# Patient Record
Sex: Male | Born: 1965
Health system: Southern US, Community
[De-identification: ages and names within clinical notes are randomized; demographics above are authoritative.]

## PROBLEM LIST (undated history)

## (undated) DIAGNOSIS — Z9889 Other specified postprocedural states: Secondary | ICD-10-CM

## (undated) DIAGNOSIS — C8190 Hodgkin lymphoma, unspecified, unspecified site: Secondary | ICD-10-CM

## (undated) DIAGNOSIS — IMO0002 Reserved for concepts with insufficient information to code with codable children: Secondary | ICD-10-CM

## (undated) DIAGNOSIS — R7989 Other specified abnormal findings of blood chemistry: Secondary | ICD-10-CM

## (undated) DIAGNOSIS — T4145XA Adverse effect of unspecified anesthetic, initial encounter: Secondary | ICD-10-CM

## (undated) DIAGNOSIS — N529 Male erectile dysfunction, unspecified: Secondary | ICD-10-CM

## (undated) DIAGNOSIS — I10 Essential (primary) hypertension: Secondary | ICD-10-CM

## (undated) DIAGNOSIS — R112 Nausea with vomiting, unspecified: Secondary | ICD-10-CM

## (undated) DIAGNOSIS — T8859XA Other complications of anesthesia, initial encounter: Secondary | ICD-10-CM

## (undated) DIAGNOSIS — E785 Hyperlipidemia, unspecified: Secondary | ICD-10-CM

## (undated) HISTORY — DX: Essential (primary) hypertension: I10

## (undated) HISTORY — DX: Hodgkin lymphoma, unspecified, unspecified site: C81.90

## (undated) HISTORY — PX: PORTA CATH REMOVAL: CATH118286

## (undated) HISTORY — PX: PORTA CATH INSERTION: CATH118285

## (undated) HISTORY — DX: Male erectile dysfunction, unspecified: N52.9

## (undated) HISTORY — PX: SPLENECTOMY: SUR1306

## (undated) HISTORY — DX: Other specified abnormal findings of blood chemistry: R79.89

## (undated) HISTORY — DX: Reserved for concepts with insufficient information to code with codable children: IMO0002

## (undated) HISTORY — DX: Hyperlipidemia, unspecified: E78.5

---

## 1987-03-02 HISTORY — PX: IR LYMPHANGIOGRAM EXTREMITY UNI LEFT: IMG5778

## 2002-10-13 ENCOUNTER — Encounter: Payer: Self-pay | Admitting: Hematology

## 2012-05-18 ENCOUNTER — Other Ambulatory Visit: Payer: Self-pay | Admitting: *Deleted

## 2012-05-18 MED ORDER — AMLODIPINE BESYLATE 5 MG PO TABS: 5.0000 mg | ORAL_TABLET | Freq: Every day | ORAL | Status: DC

## 2012-07-02 ENCOUNTER — Ambulatory Visit: Payer: Self-pay | Admitting: Family Medicine

## 2012-09-17 ENCOUNTER — Other Ambulatory Visit: Payer: Self-pay | Admitting: Family Medicine

## 2012-09-20 NOTE — Telephone Encounter (Signed)
Due for an office visit. Needs to be seen. Can refill for 2 weeks only.

## 2012-09-20 NOTE — Telephone Encounter (Signed)
[place] to address 

## 2012-09-20 NOTE — Telephone Encounter (Signed)
Not seen since EPIC started  Last filled 05/18/12 [facility] 3 RF's

## 2012-09-21 NOTE — Telephone Encounter (Signed)
Called in to [facility] 

## 2012-10-05 ENCOUNTER — Other Ambulatory Visit: Payer: Self-pay | Admitting: Family Medicine

## 2012-10-15 ENCOUNTER — Encounter: Payer: Self-pay | Admitting: Family Medicine

## 2012-10-15 ENCOUNTER — Ambulatory Visit (INDEPENDENT_AMBULATORY_CARE_PROVIDER_SITE_OTHER): Payer: BC Managed Care – PPO | Admitting: Family Medicine

## 2012-10-15 VITALS — BP 143/95 | HR 70 | Temp 97.9°F | Ht 73.5 in | Wt 230.4 lb

## 2012-10-15 DIAGNOSIS — C8190 Hodgkin lymphoma, unspecified, unspecified site: Secondary | ICD-10-CM

## 2012-10-15 DIAGNOSIS — R7989 Other specified abnormal findings of blood chemistry: Secondary | ICD-10-CM | POA: Insufficient documentation

## 2012-10-15 DIAGNOSIS — E785 Hyperlipidemia, unspecified: Secondary | ICD-10-CM

## 2012-10-15 DIAGNOSIS — N529 Male erectile dysfunction, unspecified: Secondary | ICD-10-CM

## 2012-10-15 DIAGNOSIS — IMO0002 Reserved for concepts with insufficient information to code with codable children: Secondary | ICD-10-CM

## 2012-10-15 DIAGNOSIS — E291 Testicular hypofunction: Secondary | ICD-10-CM

## 2012-10-15 DIAGNOSIS — I10 Essential (primary) hypertension: Secondary | ICD-10-CM

## 2012-10-15 DIAGNOSIS — C8108 Nodular lymphocyte predominant Hodgkin lymphoma, lymph nodes of multiple sites: Secondary | ICD-10-CM | POA: Insufficient documentation

## 2012-10-15 MED ORDER — AMLODIPINE BESYLATE 5 MG PO TABS: ORAL_TABLET | ORAL | Status: DC

## 2012-10-15 NOTE — Patient Instructions (Addendum)
    Dr Doyel Mulkern's Recommendations  For nutrition information, I recommend books:  1).Eat to Live by Dr Joel Fuhrman. 2).Prevent and Reverse Heart Disease by Dr Caldwell Esselstyn. 3) Dr Neal Barnard's Book:  Program to Reverse Diabetes  Exercise recommendations are:  If unable to walk, then the patient can exercise in a chair 3 times a day. By flapping arms like a bird gently and raising legs outwards to the front.  If ambulatory, the patient can go for walks for 30 minutes 3 times a week. Then increase the intensity and duration as tolerated.  Goal is to try to attain exercise frequency to 5 times a week.  If applicable: Best to perform resistance exercises (machines or weights) 2 days a week and cardio type exercises 3 days per week.  DASH Diet The DASH diet stands for "Dietary Approaches to Stop Hypertension." It is a healthy eating plan that has been shown to reduce high blood pressure (hypertension) in as little as 14 days, while also possibly providing other significant health benefits. These other health benefits include reducing the risk of breast cancer after menopause and reducing the risk of type 2 diabetes, heart disease, colon cancer, and stroke. Health benefits also include weight loss and slowing kidney failure in patients with chronic kidney disease.  DIET GUIDELINES  Limit salt (sodium). Your diet should contain less than 1500 mg of sodium daily.  Limit refined or processed carbohydrates. Your diet should include mostly whole grains. Desserts and added sugars should be used sparingly.  Include small amounts of heart-healthy fats. These types of fats include nuts, oils, and tub margarine. Limit saturated and trans fats. These fats have been shown to be harmful in the body. CHOOSING FOODS  The following food groups are based on a 2000 calorie diet. See your Registered Dietitian for individual calorie needs. Grains and Grain Products (6 to 8 servings daily)  Eat More  Often: Whole-wheat bread, Zullo rice, whole-grain or wheat pasta, quinoa, popcorn without added fat or salt (air popped).  Eat Less Often: White bread, white pasta, white rice, cornbread. Vegetables (4 to 5 servings daily)  Eat More Often: Fresh, frozen, and canned vegetables. Vegetables may be raw, steamed, roasted, or grilled with a minimal amount of fat.  Eat Less Often/Avoid: Creamed or fried vegetables. Vegetables in a cheese sauce. Fruit (4 to 5 servings daily)  Eat More Often: All fresh, canned (in natural juice), or frozen fruits. Dried fruits without added sugar. One hundred percent fruit juice ( cup [237 mL] daily).  Eat Less Often: Dried fruits with added sugar. Canned fruit in light or heavy syrup. Lean Meats, Fish, and Poultry (2 servings or less daily. One serving is 3 to 4 oz [85-114 g]).  Eat More Often: Ninety percent or leaner ground beef, tenderloin, sirloin. Round cuts of beef, chicken breast, turkey breast. All fish. Grill, bake, or broil your meat. Nothing should be fried.  Eat Less Often/Avoid: Fatty cuts of meat, turkey, or chicken leg, thigh, or wing. Fried cuts of meat or fish. Dairy (2 to 3 servings)  Eat More Often: Low-fat or fat-free milk, low-fat plain or light yogurt, reduced-fat or part-skim cheese.  Eat Less Often/Avoid: Milk (whole, 2%).Whole milk yogurt. Full-fat cheeses. Nuts, Seeds, and Legumes (4 to 5 servings per week)  Eat More Often: All without added salt.  Eat Less Often/Avoid: Salted nuts and seeds, canned beans with added salt. Fats and Sweets (limited)  Eat More Often: Vegetable oils, tub margarines   without trans fats, sugar-free gelatin. Mayonnaise and salad dressings.  Eat Less Often/Avoid: Coconut oils, palm oils, butter, stick margarine, cream, half and half, cookies, candy, pie. FOR MORE INFORMATION The Dash Diet Eating Plan: www.dashdiet.org Document Released: 01/09/2011 Document Revised: 04/14/2011 Document Reviewed:  01/09/2011 Exit[employer] Patient Information 2014 [employer].  

## 2012-10-15 NOTE — Progress Notes (Signed)
Patient ID: Devin Tran, male   DOB: March 20, 1965, 47 y.o.   MRN: [id number] SUBJECTIVE: CC: Chief Complaint  Patient presents with  . Medication Refill    HPI: Patient is here for follow up of hypertension/HLD: denies Headache;deniesChest Pain;denies weakness;denies Shortness of Breath or Orthopnea;denies Visual changes;denies palpitations;denies cough;denies pedal edema;denies symptoms of TIA or stroke; admits to Compliance with medications. denies Problems with medications.  Past Medical History  Diagnosis Date  . Hypertension   . Erectile dysfunction   . Hyperlipidemia   . Infertility   . Hodgkin's lymphoma   . Low testosterone    No past surgical history on file. History   Social History  . Marital Status: Single    Spouse Name: N/A    Number of Children: N/A  . Years of Education: N/A   Occupational History  . Not on file.   Social History Main Topics  . Smoking status: Never Smoker   . Smokeless tobacco: Not on file  . Alcohol Use: No  . Drug Use: No  . Sexual Activity: Not on file   Other Topics Concern  . Not on file   Social History Narrative  . No narrative on file   Family History  Problem Relation Age of Onset  . Hypertension Mother   . Cancer Father     lung  . Hypertension Father   . Diabetes Father   . COPD Father   . Hypertension Brother   . Stroke Brother    No current outpatient prescriptions on file prior to visit.   No current facility-administered medications on file prior to visit.   Allergies  Allergen Reactions  . Bleomycin     Cramping all over    There is no immunization history on file for this patient. Prior to Admission medications   Medication Sig Start Date End Date Taking? Authorizing Provider  amLODipine (NORVASC) 5 MG tablet TAKE 1 TABLET (5 MG TOTAL) BY MOUTH DAILY. 10/15/12  Yes Ileana Ladd, MD     ROS: As above in the HPI. All other systems are stable or negative.  OBJECTIVE: APPEARANCE:  Patient in  no acute distress.The patient appeared well nourished and normally developed. Acyanotic. Waist: VITAL SIGNS:BP 143/95  Pulse 70  Temp(Src) 97.9 F (36.6 C) (Oral)  Ht 6' 1.5" (1.867 m)  Wt 230 lb 6.4 oz (104.509 kg)  BMI 29.98 kg/m2 Overweight AAM  SKIN: warm and  Dry without overt rashes, tattoos and scars  HEAD and Neck: without JVD, Head and scalp: normal Eyes:No scleral icterus. Fundi normal, eye movements normal. Ears: Auricle normal, canal normal, Tympanic membranes normal, insufflation normal. Nose: normal Throat: normal Neck & thyroid: normal  CHEST & LUNGS: Chest wall: normal Lungs: Clear  CVS: Reveals the PMI to be normally located. Regular rhythm, First and Second Heart sounds are normal,  absence of murmurs, rubs or gallops. Peripheral vasculature: Radial pulses: normal Dorsal pedis pulses: normal Posterior pulses: normal  ABDOMEN:  Appearance: normal Benign, no organomegaly, no masses, no Abdominal Aortic enlargement. No Guarding , no rebound. No Bruits. Bowel sounds: normal  RECTAL: N/A GU: N/A  EXTREMETIES: nonedematous.  MUSCULOSKELETAL:  Spine: normal Joints: intact  NEUROLOGIC: oriented to time,place and person; nonfocal. Strength is normal Sensory is normal Reflexes are normal Cranial Nerves are normal.    ASSESSMENT: Hypertension - Plan: CMP14+EGFR  Hyperlipidemia - Plan: CMP14+EGFR, NMR, lipoprofile  Erectile dysfunction - Plan: Testosterone,Free and Total  Hodgkin's lymphoma  Infertility - Plan: Testosterone,Free and Total  Low testosterone - Plan: Testosterone,Free and Total   PLAN: DASH Diet in the AVS       Dr Woodroe Mode Recommendations  For nutrition information, I recommend books:  1).Eat to Live by Dr Monico Hoar. 2).Prevent and Reverse Heart Disease by Dr Suzzette Righter. 3) Dr Katherina Right Book:  Program to Reverse Diabetes  Exercise recommendations are:  If unable to walk, then the patient can  exercise in a chair 3 times a day. By flapping arms like a bird gently and raising legs outwards to the front.  If ambulatory, the patient can go for walks for 30 minutes 3 times a week. Then increase the intensity and duration as tolerated.  Goal is to try to attain exercise frequency to 5 times a week.  If applicable: Best to perform resistance exercises (machines or weights) 2 days a week and cardio type exercises 3 days per week.  Orders Placed This Encounter  Procedures  . CMP14+EGFR  . NMR, lipoprofile  . Testosterone,Free and Total   Meds ordered this encounter  Medications  . amLODipine (NORVASC) 5 MG tablet    Sig: TAKE 1 TABLET (5 MG TOTAL) BY MOUTH DAILY.    Dispense:  30 tablet    Refill:  4    Discussed the goals for his weight and BP and LIpids.  Return in about 4 months (around 02/14/2013) for PEX and , recheck BP.  Gamal Todisco P. [place] Charon, M.D.

## 2012-10-18 LAB — CMP14+EGFR
ALT: 35 IU/L (ref 0–44)
AST: 22 IU/L (ref 0–40)
Albumin/Globulin Ratio: 2.2 (ref 1.1–2.5)
Albumin: 4.8 g/dL (ref 3.5–5.5)
Alkaline Phosphatase: 61 IU/L (ref 39–117)
BUN/Creatinine Ratio: 14 (ref 9–20)
BUN: 15 mg/dL (ref 6–24)
CO2: 25 mmol/L (ref 18–29)
Calcium: 10.2 mg/dL (ref 8.7–10.2)
Chloride: 99 mmol/L (ref 97–108)
Creatinine, Ser: 1.11 mg/dL (ref 0.76–1.27)
GFR calc Af Amer: 92 mL/min/{1.73_m2} (ref >59)
GFR calc non Af Amer: 80 mL/min/{1.73_m2} (ref >59)
Globulin, Total: 2.2 g/dL (ref 1.5–4.5)
Glucose: 90 mg/dL (ref 65–99)
Potassium: 4.4 mmol/L (ref 3.5–5.2)
Sodium: 142 mmol/L (ref 134–144)
Total Bilirubin: 0.4 mg/dL (ref 0.0–1.2)
Total Protein: 7 g/dL (ref 6.0–8.5)

## 2012-10-18 LAB — TESTOSTERONE,FREE AND TOTAL
Testosterone, Free: 6.5 pg/mL — ABNORMAL LOW (ref 6.8–21.5)
Testosterone: 395 ng/dL (ref 348–1197)

## 2012-10-18 LAB — NMR, LIPOPROFILE
Cholesterol: 181 mg/dL (ref <200)
HDL Cholesterol by NMR: 46 mg/dL (ref >=40)
HDL Particle Number: 38.3 umol/L (ref >=30.5)
LDL Particle Number: 1821 nmol/L — ABNORMAL HIGH (ref <1000)
LDL Size: 20.6 nm (ref >20.5)
LDLC SERPL CALC-MCNC: 118 mg/dL — ABNORMAL HIGH (ref <100)
LP-IR Score: 62 — ABNORMAL HIGH (ref <=45)
Small LDL Particle Number: 948 nmol/L — ABNORMAL HIGH (ref <=527)
Triglycerides by NMR: 86 mg/dL (ref <150)

## 2012-10-18 NOTE — Progress Notes (Signed)
Quick Note:  Labs abnormal. The lipid LDLparticle is very high. The testosterone is borderline low. Weight loss, plant based diet, and daily exercise could improve this, so that we can avoid medications. However, he should start on a baby aspirin daily. RTc in 3 months to recheck. If it doesn't improve we will need to start on a statin and if the testosterone drops lower may need to be on testosterone replacement. Prefer not to start on medications as yet because of risks. ______

## 2012-10-21 ENCOUNTER — Telehealth: Payer: Self-pay | Admitting: Family Medicine

## 2012-10-21 NOTE — Telephone Encounter (Signed)
Pt notified of labs

## 2012-11-08 ENCOUNTER — Other Ambulatory Visit: Payer: Self-pay | Admitting: Family Medicine

## 2012-11-09 NOTE — Telephone Encounter (Signed)
Prescription renewed in EPIC. 

## 2012-11-09 NOTE — Telephone Encounter (Signed)
Last seen 10/15/12  [place] Charon

## 2013-02-15 ENCOUNTER — Ambulatory Visit: Payer: BC Managed Care – PPO | Admitting: Family Medicine

## 2013-05-12 ENCOUNTER — Other Ambulatory Visit: Payer: Self-pay | Admitting: Family Medicine

## 2013-05-20 ENCOUNTER — Encounter: Payer: Self-pay | Admitting: Family Medicine

## 2013-05-20 ENCOUNTER — Ambulatory Visit (INDEPENDENT_AMBULATORY_CARE_PROVIDER_SITE_OTHER): Payer: BC Managed Care – PPO | Admitting: Family Medicine

## 2013-05-20 VITALS — BP 129/86 | HR 70 | Temp 98.9°F | Ht 73.5 in | Wt 234.6 lb

## 2013-05-20 DIAGNOSIS — C8190 Hodgkin lymphoma, unspecified, unspecified site: Secondary | ICD-10-CM

## 2013-05-20 DIAGNOSIS — E785 Hyperlipidemia, unspecified: Secondary | ICD-10-CM

## 2013-05-20 DIAGNOSIS — N529 Male erectile dysfunction, unspecified: Secondary | ICD-10-CM

## 2013-05-20 DIAGNOSIS — Z119 Encounter for screening for infectious and parasitic diseases, unspecified: Secondary | ICD-10-CM | POA: Insufficient documentation

## 2013-05-20 DIAGNOSIS — R7989 Other specified abnormal findings of blood chemistry: Secondary | ICD-10-CM

## 2013-05-20 DIAGNOSIS — Z125 Encounter for screening for malignant neoplasm of prostate: Secondary | ICD-10-CM | POA: Insufficient documentation

## 2013-05-20 DIAGNOSIS — I10 Essential (primary) hypertension: Secondary | ICD-10-CM

## 2013-05-20 DIAGNOSIS — Z0000 Encounter for general adult medical examination without abnormal findings: Secondary | ICD-10-CM | POA: Insufficient documentation

## 2013-05-20 DIAGNOSIS — E291 Testicular hypofunction: Secondary | ICD-10-CM | POA: Insufficient documentation

## 2013-05-20 LAB — POCT CBC
Granulocyte percent: 28.3 %G — AB (ref 37–80)
HCT, POC: 49.4 % (ref 43.5–53.7)
Hemoglobin: 14.8 g/dL (ref 14.1–18.1)
Lymph, poc: 4.5 — AB (ref 0.6–3.4)
MCH, POC: 25.5 pg — AB (ref 27–31.2)
MCHC: 29.9 g/dL — AB (ref 31.8–35.4)
MCV: 85.1 fL (ref 80–97)
MPV: 7.7 fL (ref 0–99.8)
POC Granulocyte: 2 (ref 2–6.9)
POC LYMPH PERCENT: 64.7 %L — AB (ref 10–50)
Platelet Count, POC: 255 10*3/uL (ref 142–424)
RBC: 5.8 M/uL (ref 4.69–6.13)
RDW, POC: 14.4 %
WBC: 7 10*3/uL (ref 4.6–10.2)

## 2013-05-20 NOTE — Patient Instructions (Addendum)
HEALTH MAINTENANCE Immunizations: Tetanus-Diphtheria Booster IZT:IWPYK with insurance Pertusis Booster due: check with insurance Flu Shot Due: every Fall Pneumonia Vaccine: usually at 46 years of age unless there are certain risk situations. Herpes Zoster/Shingles Vaccine due: usually at 46 years of age HPV DXI:PJAS age 74 to 47 years in males and females.  Healthy Life Habits: Exercise Goal: 5-6 days/week; start gradually(ie 30 minutes/3days per week) Nutrition: Balanced healthy meals including Vegetables and Fruits. Consider  Reading the following books: 1) Eat to Live by Dr Diona Browner; 2) Prevent and Reverse Heart Disease by Dr Karl Luke.  Vitamins:multivitamin okay Aspirin:81 mg daily Stop Tobacco Use:n/a Seat Belt Use:+++ recommended Sunscreen Use:+++ recommended  Recommended Screening Tests: Colon Cancer Screening:at 48 years old Blood work: today Cholesterol Screening:    today        HIV:   ?                 Hepatitis C:today  Eye Exam: every 1 to 2 years recommended Dental Health: at least every 6 months  Others:    Living Will/Healthcare Power of Attorney: should have this in order with your personal estate planning  Check with the insurance if they would pay for Pneumovax 23, and the prevnar 13. And the tetanus booster. In view of you having had lymphoma and spleen removed.

## 2013-05-20 NOTE — Progress Notes (Signed)
Patient ID: Devin Tran, male   DOB: 1965-03-18, 48 y.o.   MRN: [id number] SUBJECTIVE: CC: Chief Complaint  Patient presents with  . Annual Exam    cpx no complaints.yearly preventative exam    HPI:  Annual physical.  Patient is here for follow up of hypertension/HTD/ED/: denies Headache;deniesChest Pain;denies weakness;denies Shortness of Breath or Orthopnea;denies Visual changes;denies palpitations;denies cough;denies pedal edema;denies symptoms of TIA or stroke; admits to Compliance with medications. denies Problems with medications.   Past Medical History  Diagnosis Date  . Hypertension   . Erectile dysfunction   . Hyperlipidemia   . Infertility   . Low testosterone   . Hodgkin's lymphoma    No past surgical history on file. History   Social History  . Marital Status: Single    Spouse Name: N/A    Number of Children: N/A  . Years of Education: N/A   Occupational History  . Not on file.   Social History Main Topics  . Smoking status: Never Smoker   . Smokeless tobacco: Not on file  . Alcohol Use: No  . Drug Use: No  . Sexual Activity: Not on file   Other Topics Concern  . Not on file   Social History Narrative  . No narrative on file   Family History  Problem Relation Age of Onset  . Hypertension Mother   . Cancer Father     lung  . Hypertension Father   . Diabetes Father   . COPD Father   . Hypertension Brother   . Stroke Brother    Current Outpatient Prescriptions on File Prior to Visit  Medication Sig Dispense Refill  . amLODipine (NORVASC) 5 MG tablet TAKE 1 TABLET (5 MG TOTAL) BY MOUTH DAILY.  30 tablet  0   No current facility-administered medications on file prior to visit.   Allergies  Allergen Reactions  . Bleomycin     Cramping all over    There is no immunization history on file for this patient. Prior to Admission medications   Medication Sig Start Date End Date Taking? Authorizing Provider  amLODipine (NORVASC) 5 MG tablet  TAKE 1 TABLET (5 MG TOTAL) BY MOUTH DAILY.   Yes Vernie Shanks, MD     ROS: As above in the HPI. All other systems are stable or negative.  OBJECTIVE: APPEARANCE:  Patient in no acute distress.The patient appeared well nourished and normally developed. Acyanotic. Waist: VITAL SIGNS:BP 129/86  Pulse 70  Temp(Src) 98.9 F (37.2 C) (Oral)  Ht 6' 1.5" (1.867 m)  Wt 234 lb 9.6 oz (106.414 kg)  BMI 30.53 kg/m2 AAM Obese SKIN: warm and  Dry without overt rashes, tattoos and scars  HEAD and Neck: without JVD, Head and scalp: normal Eyes:No scleral icterus. Fundi normal, eye movements normal. Ears: Auricle normal, canal normal, Tympanic membranes normal, insufflation normal. Nose: normal Throat: normal Neck & thyroid: normal  CHEST & LUNGS: Chest wall: normal Lungs: Clear  CVS: Reveals the PMI to be normally located. Regular rhythm, First and Second Heart sounds are normal,  absence of murmurs, rubs or gallops. Peripheral vasculature: Radial pulses: normal Dorsal pedis pulses: normal Posterior pulses: normal  ABDOMEN:  Appearance: normal Benign, no organomegaly, no masses, no Abdominal Aortic enlargement. No Guarding , no rebound. No Bruits. Bowel sounds: normal  RECTAL: N/A GU: N/A  EXTREMETIES: nonedematous.  MUSCULOSKELETAL:  Spine: normal Joints: intact  NEUROLOGIC: oriented to time,place and person; nonfocal. Strength is normal Sensory is normal Reflexes are normal Cranial  Nerves are normal.  Results for orders placed in visit on 05/20/13  POCT CBC      Result Value Ref Range   WBC 7.0  4.6 - 10.2 K/uL   Lymph, poc 4.5 (*) 0.6 - 3.4   POC LYMPH PERCENT 64.7 (*) 10 - 50 %L   MID (cbc)    0 - 0.9   POC MID %    0 - 12 %M   POC Granulocyte 2.0  2 - 6.9   Granulocyte percent 28.3 (*) 37 - 80 %G   RBC 5.8  4.69 - 6.13 M/uL   Hemoglobin 14.8  14.1 - 18.1 g/dL   HCT, POC 49.4  43.5 - 53.7 %   MCV 85.1  80 - 97 fL   MCH, POC 25.5 (*) 27 - 31.2 pg    MCHC 29.9 (*) 31.8 - 35.4 g/dL   RDW, POC 14.4     Platelet Count, POC 255.0  142 - 424 K/uL   MPV 7.7  0 - 99.8 fL    ASSESSMENT: Annual physical exam  Hypertension - Plan: CMP14+EGFR  Hyperlipidemia  Erectile dysfunction - Plan: Testosterone,Free and Total  Low testosterone - Plan: Testosterone,Free and Total  Hodgkin's lymphoma - Plan: POCT CBC  Screening for prostate cancer - Plan: PSA, total and free  Screening examination for infectious disease - Plan: Hepatitis C antibody  PLAN:      HEALTH MAINTENANCE Immunizations: Tetanus-Diphtheria Booster UEA:[id number] with insurance Pertusis Booster due: check with insurance Flu Shot Due: every Fall Pneumonia Vaccine: usually at 46 years of age unless there are certain risk situations. Herpes Zoster/Shingles Vaccine due: usually at 46 years of age HPV JXB:JYNW age 44 to 9 years in males and females.  Healthy Life Habits: Exercise Goal: 5-6 days/week; start gradually(ie 30 minutes/3days per week) Nutrition: Balanced healthy meals including Vegetables and Fruits. Consider  Reading the following books: 1) Eat to Live by Dr Diona Browner; 2) Prevent and Reverse Heart Disease by Dr Karl Luke.  Vitamins:multivitamin okay Aspirin:81 mg daily Stop Tobacco Use:n/a Seat Belt Use:+++ recommended Sunscreen Use:+++ recommended  Recommended Screening Tests: Colon Cancer Screening:at 48 years old Blood work: today Cholesterol Screening:    today        HIV:   ?                 Hepatitis C:today  Eye Exam: every 1 to 2 years recommended Dental Health: at least every 6 months  Others:    Living Will/Healthcare Power of Attorney: should have this in order with your personal estate planning  Check with the insurance if they would pay for Pneumovax 23, and the prevnar 13. And the tetanus booster. In view of you having had lymphoma and spleen removed.   Orders Placed This Encounter  Procedures  . CMP14+EGFR  . PSA, total and  free  . Testosterone,Free and Total  . Hepatitis C antibody  . POCT CBC   No orders of the defined types were placed in this encounter.   Medications Discontinued During This Encounter  Medication Reason  . amLODipine (NORVASC) 5 MG tablet Duplicate   Return in about 4 months (around 09/19/2013) for Recheck medical problems.  Chozen Latulippe P. Jacelyn Grip, M.D.

## 2013-05-25 LAB — CMP14+EGFR
ALT: 30 IU/L (ref 0–44)
AST: 22 IU/L (ref 0–40)
Albumin/Globulin Ratio: 2.1 (ref 1.1–2.5)
Albumin: 5 g/dL (ref 3.5–5.5)
Alkaline Phosphatase: 76 IU/L (ref 39–117)
BUN/Creatinine Ratio: 14 (ref 9–20)
BUN: 16 mg/dL (ref 6–24)
CO2: 24 mmol/L (ref 18–29)
Calcium: 9.8 mg/dL (ref 8.7–10.2)
Chloride: 102 mmol/L (ref 97–108)
Creatinine, Ser: 1.13 mg/dL (ref 0.76–1.27)
GFR calc Af Amer: 90 mL/min/{1.73_m2} (ref >59)
GFR calc non Af Amer: 78 mL/min/{1.73_m2} (ref >59)
Globulin, Total: 2.4 g/dL (ref 1.5–4.5)
Glucose: 104 mg/dL — ABNORMAL HIGH (ref 65–99)
Potassium: 4.5 mmol/L (ref 3.5–5.2)
Sodium: 144 mmol/L (ref 134–144)
Total Bilirubin: 0.6 mg/dL (ref 0.0–1.2)
Total Protein: 7.4 g/dL (ref 6.0–8.5)

## 2013-05-25 LAB — TESTOSTERONE,FREE AND TOTAL
Testosterone, Free: 8.9 pg/mL (ref 6.8–21.5)
Testosterone: 421 ng/dL (ref 348–1197)

## 2013-05-25 LAB — HEPATITIS C ANTIBODY: Hep C Virus Ab: <0.1 s/co ratio (ref 0.0–0.9)

## 2013-05-25 LAB — PSA, TOTAL AND FREE
PSA, Free Pct: 66 %
PSA, Free: 0.33 ng/mL
PSA: 0.5 ng/mL (ref 0.0–4.0)

## 2013-05-30 ENCOUNTER — Telehealth: Payer: Self-pay | Admitting: Family Medicine

## 2013-05-30 NOTE — Telephone Encounter (Signed)
Message copied by Waverly Ferrari on Mon May 30, 2013  9:14 AM ------      Message from: Vernie Shanks      Created: Sun May 29, 2013  4:15 PM       Call Patient      Lab result at or close to goal.      No change in Medications for now.      No Change in recommendations.      No change in plans for follow up. ------

## 2013-06-02 NOTE — Telephone Encounter (Signed)
Patient aware.

## 2013-06-17 ENCOUNTER — Other Ambulatory Visit: Payer: Self-pay | Admitting: *Deleted

## 2013-06-17 MED ORDER — AMLODIPINE BESYLATE 5 MG PO TABS: 5.0000 mg | ORAL_TABLET | Freq: Every day | ORAL | Status: DC

## 2013-11-27 ENCOUNTER — Other Ambulatory Visit: Payer: Self-pay | Admitting: Family Medicine

## 2013-11-28 NOTE — Telephone Encounter (Signed)
Last seen 05/20/13 [facility]

## 2013-12-31 ENCOUNTER — Other Ambulatory Visit: Payer: Self-pay | Admitting: Family Medicine

## 2014-01-27 ENCOUNTER — Other Ambulatory Visit: Payer: Self-pay | Admitting: Family Medicine

## 2014-03-04 ENCOUNTER — Other Ambulatory Visit: Payer: Self-pay | Admitting: Family Medicine

## 2014-03-15 ENCOUNTER — Other Ambulatory Visit: Payer: Self-pay | Admitting: Family Medicine

## 2014-03-15 ENCOUNTER — Telehealth: Payer: Self-pay | Admitting: Family Medicine

## 2014-03-16 MED ORDER — AMLODIPINE BESYLATE 5 MG PO TABS: ORAL_TABLET | ORAL | Status: DC

## 2014-03-16 NOTE — Telephone Encounter (Signed)
done

## 2014-03-30 ENCOUNTER — Ambulatory Visit (INDEPENDENT_AMBULATORY_CARE_PROVIDER_SITE_OTHER): Payer: BLUE CROSS/BLUE SHIELD | Admitting: Family Medicine

## 2014-03-30 ENCOUNTER — Encounter: Payer: Self-pay | Admitting: Family Medicine

## 2014-03-30 VITALS — BP 126/77 | HR 70 | Temp 98.0°F | Ht 73.5 in | Wt 237.4 lb

## 2014-03-30 DIAGNOSIS — I10 Essential (primary) hypertension: Secondary | ICD-10-CM | POA: Diagnosis not present

## 2014-03-30 MED ORDER — AMLODIPINE BESYLATE 5 MG PO TABS: ORAL_TABLET | ORAL | Status: DC

## 2014-03-30 NOTE — Progress Notes (Signed)
   Subjective:  Patient ID: Devin Tran, male    DOB: 07/03/1965  Age: 49 y.o. MRN: [id number]  CC: No chief complaint on file.   HPI Devin Tran presents for Patient in for follow-up of hypertension. Patient has no history of headache chest pain or shortness of breath or recent cough. Patient also denies symptoms of TIA such as numbness weakness lateralizing. Patient checks  blood pressure at home and has not had any elevated readings recently. Patient denies side effects from his medication. States taking it regularly.    History Devin Tran has a past medical history of Hypertension; Erectile dysfunction; Hyperlipidemia; Infertility; Low testosterone; and Hodgkin's lymphoma.   He has no past surgical history on file.   His family history includes COPD in his father; Cancer in his father; Diabetes in his father; Hypertension in his brother, father, and mother; Stroke in his brother.He reports that he has never smoked. He does not have any smokeless tobacco history on file. He reports that he does not drink alcohol or use illicit drugs.  No current outpatient prescriptions on file prior to visit.   No current facility-administered medications on file prior to visit.    ROS Review of Systems  Objective:  BP 126/77 mmHg  Pulse 70  Temp(Src) 98 F (36.7 C) (Oral)  Ht 6' 1.5" (1.867 m)  Wt 237 lb 6.4 oz (107.684 kg)  BMI 30.89 kg/m2  BP Readings from Last 3 Encounters:  03/30/14 126/77  05/20/13 129/86  10/15/12 143/95    Wt Readings from Last 3 Encounters:  03/30/14 237 lb 6.4 oz (107.684 kg)  05/20/13 234 lb 9.6 oz (106.414 kg)  10/15/12 230 lb 6.4 oz (104.509 kg)     Physical Exam  No results found for: HGBA1C  Lab Results  Component Value Date   WBC 7.0 05/20/2013   HGB 14.8 05/20/2013   HCT 49.4 05/20/2013   GLUCOSE 104* 05/20/2013   CHOL 181 10/15/2012   TRIG 86 10/15/2012   HDL 46 10/15/2012   LDLCALC 118* 10/15/2012   ALT 30 05/20/2013   AST 22 05/20/2013     NA 144 05/20/2013   K 4.5 05/20/2013   CL 102 05/20/2013   CREATININE 1.13 05/20/2013   BUN 16 05/20/2013   CO2 24 05/20/2013   PSA 0.5 05/20/2013    Patient was never admitted.  Assessment & Plan:   Diagnoses and all orders for this visit:  Essential hypertension  Other orders -     amLODipine (NORVASC) 5 MG tablet; TAKE 1 TABLET (5 MG TOTAL) BY MOUTH DAILY.  I am having Mr. Chisenhall maintain his amLODipine.  Meds ordered this encounter  Medications  . amLODipine (NORVASC) 5 MG tablet    Sig: TAKE 1 TABLET (5 MG TOTAL) BY MOUTH DAILY.    Dispense:  30 tablet    Refill:  5    Comments: Stable on current regimen  Follow-up: Return in about 6 months (around 09/28/2014) for CPE.  [name], M.D.

## 2014-09-28 ENCOUNTER — Encounter: Payer: Self-pay | Admitting: Family Medicine

## 2014-09-28 ENCOUNTER — Ambulatory Visit (INDEPENDENT_AMBULATORY_CARE_PROVIDER_SITE_OTHER): Payer: BLUE CROSS/BLUE SHIELD | Admitting: Family Medicine

## 2014-09-28 ENCOUNTER — Ambulatory Visit: Payer: BLUE CROSS/BLUE SHIELD | Admitting: Family Medicine

## 2014-09-28 VITALS — BP 133/87 | HR 74 | Temp 98.6°F | Ht 73.5 in | Wt 236.4 lb

## 2014-09-28 DIAGNOSIS — R7989 Other specified abnormal findings of blood chemistry: Secondary | ICD-10-CM

## 2014-09-28 DIAGNOSIS — E291 Testicular hypofunction: Secondary | ICD-10-CM

## 2014-09-28 DIAGNOSIS — Z1212 Encounter for screening for malignant neoplasm of rectum: Secondary | ICD-10-CM | POA: Diagnosis not present

## 2014-09-28 DIAGNOSIS — I10 Essential (primary) hypertension: Secondary | ICD-10-CM | POA: Diagnosis not present

## 2014-09-28 DIAGNOSIS — Z0000 Encounter for general adult medical examination without abnormal findings: Secondary | ICD-10-CM | POA: Diagnosis not present

## 2014-09-28 NOTE — Progress Notes (Signed)
Subjective:  Patient ID: Devin Tran, male    DOB: [birth year]  Age: 49 y.o. MRN: [id number]  CC: Annual Exam   HPI Devin Tran presents for annual exam.   follow-up of hypertension. Patient has no history of headache chest pain or shortness of breath or recent cough. Patient also denies symptoms of TIA such as numbness weakness lateralizing. Patient checks  blood pressure at home and has not had any elevated readings recently. Patient denies side effects from his medication. States taking it regularly.   Patient in for follow-up of elevated cholesterol. Although his cholesterol has been high he does not take medication. Currently no chest pain, shortness of breath or other cardiovascular related symptoms noted.  In the past he has been noted to have low testosterone. However recheck was negative. He does have erectile dysfunction and loss of libido. He wonders if this is a sequela of his Hodgkin's lymphoma which was treated when he was a young adult.  History Devin Tran has a past medical history of Hypertension; Erectile dysfunction; Hyperlipidemia; Infertility; Low testosterone; and Hodgkin's lymphoma.   He has past surgical history that includes Splenectomy (Left, Over 20 years ago).  His family history includes COPD in his father; Cancer in his father; Diabetes in his father; Hypertension in his brother, father, and mother; Stroke in his brother.He reports that he has never smoked. He does not have any smokeless tobacco history on file. He reports that he does not drink alcohol or use illicit drugs.  Outpatient Prescriptions Prior to Visit  Medication Sig Dispense Refill  . amLODipine (NORVASC) 5 MG tablet TAKE 1 TABLET (5 MG TOTAL) BY MOUTH DAILY. 30 tablet 5   No facility-administered medications prior to visit.    ROS Review of Systems  Constitutional: Negative for fever, chills, diaphoresis, activity change, appetite change, fatigue and unexpected weight change.  HENT: Negative  for congestion, ear pain, hearing loss, postnasal drip, rhinorrhea, sore throat, tinnitus and trouble swallowing.   Eyes: Negative for photophobia, pain, discharge and redness.  Respiratory: Negative for apnea, cough, choking, chest tightness, shortness of breath, wheezing and stridor.   Cardiovascular: Negative for chest pain, palpitations and leg swelling.  Gastrointestinal: Negative for nausea, vomiting, abdominal pain, diarrhea, constipation, blood in stool and abdominal distention.  Endocrine: Negative for cold intolerance, heat intolerance, polydipsia, polyphagia and polyuria.  Genitourinary: Negative for dysuria, urgency, frequency, hematuria, flank pain, scrotal swelling, enuresis, difficulty urinating, genital sores and testicular pain.  Musculoskeletal: Negative for joint swelling and arthralgias.  Skin: Negative for color change, rash and wound.  Allergic/Immunologic: Negative for environmental allergies, food allergies and immunocompromised state.  Neurological: Negative for dizziness, tremors, seizures, syncope, facial asymmetry, speech difficulty, weakness, light-headedness, numbness and headaches.  Hematological: Does not bruise/bleed easily.  Psychiatric/Behavioral: Negative for suicidal ideas, hallucinations, behavioral problems, confusion, sleep disturbance, dysphoric mood, decreased concentration and agitation. The patient is not nervous/anxious and is not hyperactive.     Objective:  BP 133/87 mmHg  Pulse 74  Temp(Src) 98.6 F (37 C) (Oral)  Ht 6' 1.5" (1.867 m)  Wt 236 lb 6.4 oz (107.23 kg)  BMI 30.76 kg/m2  BP Readings from Last 3 Encounters:  09/28/14 133/87  03/30/14 126/77  05/20/13 129/86    Wt Readings from Last 3 Encounters:  09/28/14 236 lb 6.4 oz (107.23 kg)  03/30/14 237 lb 6.4 oz (107.684 kg)  05/20/13 234 lb 9.6 oz (106.414 kg)     Physical Exam  Constitutional: He is oriented to person, place, and  time. He appears well-developed and  well-nourished.  HENT:  Head: Normocephalic and atraumatic.  Mouth/Throat: Oropharynx is clear and moist.  Eyes: EOM are normal. Pupils are equal, round, and reactive to light.  Neck: Normal range of motion. No tracheal deviation present. No thyromegaly present.  Cardiovascular: Normal rate, regular rhythm and normal heart sounds.  Exam reveals no gallop and no friction rub.   No murmur heard. Pulmonary/Chest: Breath sounds normal. He has no wheezes. He has no rales.  Abdominal: Soft. He exhibits no mass. There is no tenderness.  Genitourinary: Rectum normal, prostate normal and penis normal.  Musculoskeletal: Normal range of motion. He exhibits no edema.  Neurological: He is alert and oriented to person, place, and time.  Skin: Skin is warm and dry.  Psychiatric: He has a normal mood and affect.    No results found for: HGBA1C  Lab Results  Component Value Date   WBC 8.1 09/28/2014   HGB 14.8 05/20/2013   HCT 45.1 09/28/2014   GLUCOSE 137* 09/28/2014   CHOL 205* 09/28/2014   TRIG 218* 09/28/2014   HDL 42 09/28/2014   LDLCALC 119* 09/28/2014   ALT 33 09/28/2014   AST 27 09/28/2014   NA 141 09/28/2014   K 4.1 09/28/2014   CL 99 09/28/2014   CREATININE 0.99 09/28/2014   BUN 15 09/28/2014   CO2 25 09/28/2014   TSH 1.020 09/28/2014   PSA 0.4 09/28/2014    Patient was never admitted.  Assessment & Plan:   Devin Tran was seen today for annual exam.  Diagnoses and all orders for this visit:  Wellness examination -     CBC with Differential/Platelet -     CMP14+EGFR -     Lipid panel -     PSA, total and free -     TSH -     Vit D  25 hydroxy (rtn osteoporosis monitoring) -     T4, Free  Low testosterone -     Testosterone,Free and Total -     Testosterone,Free and Total; Future  Screening for malignant neoplasm of the rectum -     Fecal occult blood, imunochemical  Other orders -     CBC with Differential/Platelet -     Testosterone,Free and Total   I am  having [name] maintain his amLODipine.  No orders of the defined types were placed in this encounter.   We discussed the importance of regular exercise including resistance training. Healthy diet with low-fat and weight loss considerations. Since his testosterone tends to be borderline, if his result shows clearly a low testosterone he will be prescribed a supplement since he is symptomatic with low libido and erectile dysfunction, however should his testosterone lab result return normal over-the-counter herbal supplements were reviewed. The usual discussion regarding automobile safety, safe sex were discussed as part of this anticipatory guidance.  Follow-up: Return in about 6 months (around 03/31/2015).  [name], M.D.

## 2014-09-28 NOTE — Patient Instructions (Signed)
nugenix or Test X180 can help with libido

## 2014-09-29 LAB — CMP14+EGFR
ALBUMIN: 4.5 g/dL (ref 3.5–5.5)
ALT: 33 IU/L (ref 0–44)
AST: 27 IU/L (ref 0–40)
Albumin/Globulin Ratio: 1.7 (ref 1.1–2.5)
Alkaline Phosphatase: 66 IU/L (ref 39–117)
BUN / CREAT RATIO: 15 (ref 9–20)
BUN: 15 mg/dL (ref 6–24)
Bilirubin Total: 0.3 mg/dL (ref 0.0–1.2)
CALCIUM: 9.5 mg/dL (ref 8.7–10.2)
CO2: 25 mmol/L (ref 18–29)
CREATININE: 0.99 mg/dL (ref 0.76–1.27)
Chloride: 99 mmol/L (ref 97–108)
GFR calc non Af Amer: 90 mL/min/{1.73_m2} (ref >59)
GFR, EST AFRICAN AMERICAN: 104 mL/min/{1.73_m2} (ref >59)
GLUCOSE: 137 mg/dL — AB (ref 65–99)
Globulin, Total: 2.7 g/dL (ref 1.5–4.5)
Potassium: 4.1 mmol/L (ref 3.5–5.2)
Sodium: 141 mmol/L (ref 134–144)
TOTAL PROTEIN: 7.2 g/dL (ref 6.0–8.5)

## 2014-09-29 LAB — LIPID PANEL
Chol/HDL Ratio: 4.9 ratio units (ref 0.0–5.0)
Cholesterol, Total: 205 mg/dL — ABNORMAL HIGH (ref 100–199)
HDL: 42 mg/dL (ref >39)
LDL CALC: 119 mg/dL — AB (ref 0–99)
Triglycerides: 218 mg/dL — ABNORMAL HIGH (ref 0–149)
VLDL CHOLESTEROL CAL: 44 mg/dL — AB (ref 5–40)

## 2014-09-29 LAB — CBC WITH DIFFERENTIAL/PLATELET
BASOS ABS: 0 10*3/uL (ref 0.0–0.2)
BASOS: 0 %
EOS (ABSOLUTE): 0.2 10*3/uL (ref 0.0–0.4)
Eos: 3 %
HEMOGLOBIN: 15.1 g/dL (ref 12.6–17.7)
Hematocrit: 45.1 % (ref 37.5–51.0)
IMMATURE GRANS (ABS): 0 10*3/uL (ref 0.0–0.1)
IMMATURE GRANULOCYTES: 0 %
LYMPHS: 64 %
Lymphocytes Absolute: 5.2 10*3/uL — ABNORMAL HIGH (ref 0.7–3.1)
MCH: 27.9 pg (ref 26.6–33.0)
MCHC: 33.5 g/dL (ref 31.5–35.7)
MCV: 83 fL (ref 79–97)
MONOCYTES: 8 %
Monocytes Absolute: 0.6 10*3/uL (ref 0.1–0.9)
NEUTROS ABS: 2.1 10*3/uL (ref 1.4–7.0)
Neutrophils: 25 %
PLATELETS: 241 10*3/uL (ref 150–379)
RBC: 5.42 x10E6/uL (ref 4.14–5.80)
RDW: 15 % (ref 12.3–15.4)
WBC: 8.1 10*3/uL (ref 3.4–10.8)

## 2014-09-29 LAB — TESTOSTERONE,FREE AND TOTAL
TESTOSTERONE FREE: 6.2 pg/mL — AB (ref 6.8–21.5)
TESTOSTERONE: 296 ng/dL — AB (ref 348–1197)

## 2014-09-29 LAB — TSH: TSH: 1.02 u[IU]/mL (ref 0.450–4.500)

## 2014-09-29 LAB — T4, FREE: FREE T4: 0.96 ng/dL (ref 0.82–1.77)

## 2014-09-29 LAB — PSA, TOTAL AND FREE
PROSTATE SPECIFIC AG, SERUM: 0.4 ng/mL (ref 0.0–4.0)
PSA FREE: 0.27 ng/mL
PSA, Free Pct: 67.5 %

## 2014-09-29 LAB — VITAMIN D 25 HYDROXY (VIT D DEFICIENCY, FRACTURES): Vit D, 25-Hydroxy: 26.4 ng/mL — ABNORMAL LOW (ref 30.0–100.0)

## 2014-09-30 LAB — FECAL OCCULT BLOOD, IMMUNOCHEMICAL: FECAL OCCULT BLD: NEGATIVE

## 2014-10-01 ENCOUNTER — Encounter: Payer: Self-pay | Admitting: Family Medicine

## 2014-10-07 ENCOUNTER — Other Ambulatory Visit: Payer: BLUE CROSS/BLUE SHIELD

## 2014-10-07 DIAGNOSIS — R7989 Other specified abnormal findings of blood chemistry: Secondary | ICD-10-CM

## 2014-10-11 LAB — TESTOSTERONE,FREE AND TOTAL
TESTOSTERONE: 396 ng/dL (ref 348–1197)
Testosterone, Free: 7.8 pg/mL (ref 6.8–21.5)

## 2014-10-20 ENCOUNTER — Other Ambulatory Visit: Payer: Self-pay | Admitting: Family Medicine

## 2015-04-02 ENCOUNTER — Encounter: Payer: Self-pay | Admitting: Family Medicine

## 2015-04-02 ENCOUNTER — Ambulatory Visit (INDEPENDENT_AMBULATORY_CARE_PROVIDER_SITE_OTHER): Payer: BLUE CROSS/BLUE SHIELD | Admitting: Family Medicine

## 2015-04-02 VITALS — BP 124/79 | HR 68 | Temp 98.5°F | Ht 74.0 in | Wt 234.6 lb

## 2015-04-02 DIAGNOSIS — E785 Hyperlipidemia, unspecified: Secondary | ICD-10-CM | POA: Diagnosis not present

## 2015-04-02 DIAGNOSIS — I10 Essential (primary) hypertension: Secondary | ICD-10-CM

## 2015-04-02 DIAGNOSIS — E291 Testicular hypofunction: Secondary | ICD-10-CM | POA: Diagnosis not present

## 2015-04-02 DIAGNOSIS — N5203 Combined arterial insufficiency and corporo-venous occlusive erectile dysfunction: Secondary | ICD-10-CM

## 2015-04-02 NOTE — Progress Notes (Signed)
is low  Subjective:  Patient ID: Devin Tran, male    DOB: [birth year]  Age: 50 y.o. MRN: [id number]  CC: Hypertension   HPI Devin Tran presents for  follow-up of hypertension. Patient has no history of headache chest pain or shortness of breath or recent cough. Patient also denies symptoms of TIA such as numbness weakness lateralizing. Patient checks  blood pressure at home and has not had any elevated readings recently. Patient denies side effects from his medication. States taking it regularly.  Patient also  in for follow-up of elevated cholesterol. Doing well without complaints on current medication. Denies side effects of statin including myalgia and arthralgia and nausea. Also in today for liver function testing. Currently no chest pain, shortness of breath or other cardiovascular related symptoms noted.  Follow-up on hypogonadism. Not taking medication since the most recent level was normal. Erectile dysfunction is intermittent only.   History Devin Tran has a past medical history of Hypertension; Erectile dysfunction; Hyperlipidemia; Infertility; Low testosterone; and Hodgkin's lymphoma (Devin Tran).   He has past surgical history that includes Splenectomy (Left, Over 20 years ago).   His family history includes COPD in his father; Cancer in his father; Diabetes in his father; Hypertension in his brother, father, and mother; Stroke in his brother.He reports that he has never smoked. He does not have any smokeless tobacco history on file. He reports that he does not drink alcohol or use illicit drugs.  Current Outpatient Prescriptions on File Prior to Visit  Medication Sig Dispense Refill  . amLODipine (NORVASC) 5 MG tablet TAKE 1 TABLET (5 MG TOTAL) BY MOUTH DAILY. 30 tablet 5   No current facility-administered medications on file prior to visit.    ROS Review of Systems  Constitutional: Negative for fever, chills, diaphoresis and unexpected weight change.  HENT: Negative for congestion,  hearing loss, rhinorrhea and sore throat.   Eyes: Negative for visual disturbance.  Respiratory: Negative for cough and shortness of breath.   Cardiovascular: Negative for chest pain.  Gastrointestinal: Negative for abdominal pain, diarrhea and constipation.  Genitourinary: Negative for dysuria and flank pain.  Musculoskeletal: Negative for joint swelling and arthralgias.  Skin: Negative for rash.  Neurological: Negative for dizziness and headaches.  Psychiatric/Behavioral: Negative for sleep disturbance and dysphoric mood.    Objective:  BP 124/79 mmHg  Pulse 68  Temp(Src) 98.5 F (36.9 C) (Oral)  Ht 6' 2" (1.88 m)  Wt 234 lb 9.6 oz (106.414 kg)  BMI 30.11 kg/m2  SpO2 98%  BP Readings from Last 3 Encounters:  04/02/15 124/79  09/28/14 133/87  03/30/14 126/77    Wt Readings from Last 3 Encounters:  04/02/15 234 lb 9.6 oz (106.414 kg)  09/28/14 236 lb 6.4 oz (107.23 kg)  03/30/14 237 lb 6.4 oz (107.684 kg)     Physical Exam  Constitutional: He is oriented to person, place, and time. He appears well-developed and well-nourished. No distress.  HENT:  Head: Normocephalic and atraumatic.  Right Ear: External ear normal.  Left Ear: External ear normal.  Nose: Nose normal.  Mouth/Throat: Oropharynx is clear and moist.  Eyes: Conjunctivae and EOM are normal. Pupils are equal, round, and reactive to light.  Neck: Normal range of motion. Neck supple. No thyromegaly present.  Cardiovascular: Normal rate, regular rhythm and normal heart sounds.   No murmur heard. Pulmonary/Chest: Effort normal and breath sounds normal. No respiratory distress. He has no wheezes. He has no rales.  Abdominal: Soft. Bowel sounds are normal. He exhibits no  distension. There is no tenderness.  Lymphadenopathy:    He has no cervical adenopathy.  Neurological: He is alert and oriented to person, place, and time. He has normal reflexes.  Skin: Skin is warm and dry.  Psychiatric: He has a normal mood  and affect. His behavior is normal. Judgment and thought content normal.   patient will follow up in 5 days for his blood work. The rash now being that if his testosterone is low the level has to be drawn before 10 AM and it is now 10:30. Insurance companies now requiring pre-10 AM testosterone level before prescribing approval purposes  No results found for: HGBA1C  Lab Results  Component Value Date   WBC 8.1 09/28/2014   HGB 14.8 05/20/2013   HCT 45.1 09/28/2014   PLT 241 09/28/2014   GLUCOSE 137* 09/28/2014   CHOL 205* 09/28/2014   TRIG 218* 09/28/2014   HDL 42 09/28/2014   LDLCALC 119* 09/28/2014   ALT 33 09/28/2014   AST 27 09/28/2014   NA 141 09/28/2014   K 4.1 09/28/2014   CL 99 09/28/2014   CREATININE 0.99 09/28/2014   BUN 15 09/28/2014   CO2 25 09/28/2014   TSH 1.020 09/28/2014   PSA 0.5 05/20/2013    Patient was never admitted.  Assessment & Plan:   Devin Tran was seen today for hypertension.  Diagnoses and all orders for this visit:  Hyperlipidemia -     CBC with Differential/Platelet -     CMP14+EGFR  Essential hypertension -     CBC with Differential/Platelet -     CMP14+EGFR  Hypogonadism male -     CBC with Differential/Platelet -     Testosterone,Free and Total  Combined arterial insufficiency and corporo-venous occlusive erectile dysfunction -     CBC with Differential/Platelet -     Testosterone,Free and Total   I am having [name] maintain his amLODipine.  No orders of the defined types were placed in this encounter.     Follow-up: Return in about 6 months (around 09/30/2015) for hypertension, cholesterol.  [name], M.D.

## 2015-04-07 ENCOUNTER — Other Ambulatory Visit: Payer: BLUE CROSS/BLUE SHIELD

## 2015-04-08 LAB — CBC WITH DIFFERENTIAL/PLATELET
Basophils Absolute: 0 10*3/uL (ref 0.0–0.2)
Basos: 0 %
EOS (ABSOLUTE): 0.1 10*3/uL (ref 0.0–0.4)
Eos: 1 %
Hematocrit: 45.6 % (ref 37.5–51.0)
Hemoglobin: 15.2 g/dL (ref 12.6–17.7)
Immature Grans (Abs): 0 10*3/uL (ref 0.0–0.1)
Immature Granulocytes: 0 %
Lymphocytes Absolute: 4.3 10*3/uL — ABNORMAL HIGH (ref 0.7–3.1)
Lymphs: 67 %
MCH: 27.7 pg (ref 26.6–33.0)
MCHC: 33.3 g/dL (ref 31.5–35.7)
MCV: 83 fL (ref 79–97)
Monocytes Absolute: 0.6 10*3/uL (ref 0.1–0.9)
Monocytes: 9 %
Neutrophils Absolute: 1.5 10*3/uL (ref 1.4–7.0)
Neutrophils: 23 %
Platelets: 251 10*3/uL (ref 150–379)
RBC: 5.48 x10E6/uL (ref 4.14–5.80)
RDW: 14.1 % (ref 12.3–15.4)
WBC: 6.4 10*3/uL (ref 3.4–10.8)

## 2015-04-08 LAB — CMP14+EGFR
ALT: 33 IU/L (ref 0–44)
AST: 22 IU/L (ref 0–40)
Albumin/Globulin Ratio: 1.8 (ref 1.1–2.5)
Albumin: 4.6 g/dL (ref 3.5–5.5)
Alkaline Phosphatase: 75 IU/L (ref 39–117)
BUN/Creatinine Ratio: 12 (ref 9–20)
BUN: 14 mg/dL (ref 6–24)
Bilirubin Total: 0.6 mg/dL (ref 0.0–1.2)
CO2: 21 mmol/L (ref 18–29)
Calcium: 9.6 mg/dL (ref 8.7–10.2)
Chloride: 105 mmol/L (ref 96–106)
Creatinine, Ser: 1.13 mg/dL (ref 0.76–1.27)
GFR calc Af Amer: 89 mL/min/{1.73_m2} (ref >59)
GFR calc non Af Amer: 77 mL/min/{1.73_m2} (ref >59)
Globulin, Total: 2.6 g/dL (ref 1.5–4.5)
Glucose: 103 mg/dL — ABNORMAL HIGH (ref 65–99)
Potassium: 4.6 mmol/L (ref 3.5–5.2)
Sodium: 146 mmol/L — ABNORMAL HIGH (ref 134–144)
Total Protein: 7.2 g/dL (ref 6.0–8.5)

## 2015-04-08 LAB — TESTOSTERONE,FREE AND TOTAL
Testosterone, Free: 7.6 pg/mL (ref 6.8–21.5)
Testosterone: 399 ng/dL (ref 348–1197)

## 2015-04-22 ENCOUNTER — Other Ambulatory Visit: Payer: Self-pay | Admitting: Family Medicine

## 2015-09-21 ENCOUNTER — Other Ambulatory Visit: Payer: Self-pay | Admitting: Family Medicine

## 2015-11-18 ENCOUNTER — Other Ambulatory Visit: Payer: Self-pay | Admitting: Family Medicine

## 2015-12-19 ENCOUNTER — Other Ambulatory Visit: Payer: Self-pay | Admitting: Family Medicine

## 2016-01-18 ENCOUNTER — Other Ambulatory Visit: Payer: Self-pay | Admitting: Family Medicine

## 2016-02-16 ENCOUNTER — Other Ambulatory Visit: Payer: Self-pay | Admitting: Family Medicine

## 2016-03-22 ENCOUNTER — Other Ambulatory Visit: Payer: Self-pay | Admitting: Family Medicine

## 2016-03-24 NOTE — Telephone Encounter (Signed)
Authorize 30 days only. Then contact the patient letting them know that they will need an appointment before any further prescriptions can be sent in. 

## 2016-04-22 ENCOUNTER — Other Ambulatory Visit: Payer: Self-pay | Admitting: Family Medicine

## 2016-04-30 ENCOUNTER — Other Ambulatory Visit: Payer: Self-pay | Admitting: Family Medicine

## 2016-05-12 ENCOUNTER — Encounter: Payer: Self-pay | Admitting: Family Medicine

## 2016-05-12 ENCOUNTER — Ambulatory Visit (INDEPENDENT_AMBULATORY_CARE_PROVIDER_SITE_OTHER): Payer: BLUE CROSS/BLUE SHIELD | Admitting: Family Medicine

## 2016-05-12 ENCOUNTER — Encounter (INDEPENDENT_AMBULATORY_CARE_PROVIDER_SITE_OTHER): Payer: Self-pay

## 2016-05-12 ENCOUNTER — Telehealth: Payer: Self-pay | Admitting: Family Medicine

## 2016-05-12 VITALS — BP 133/87 | HR 77 | Temp 99.0°F | Ht 74.0 in | Wt 240.0 lb

## 2016-05-12 DIAGNOSIS — E291 Testicular hypofunction: Secondary | ICD-10-CM

## 2016-05-12 DIAGNOSIS — E782 Mixed hyperlipidemia: Secondary | ICD-10-CM | POA: Diagnosis not present

## 2016-05-12 DIAGNOSIS — Z0000 Encounter for general adult medical examination without abnormal findings: Secondary | ICD-10-CM

## 2016-05-12 DIAGNOSIS — I10 Essential (primary) hypertension: Secondary | ICD-10-CM | POA: Diagnosis not present

## 2016-05-12 MED ORDER — AMLODIPINE BESYLATE 5 MG PO TABS: ORAL_TABLET | ORAL | 11 refills | Status: DC

## 2016-05-12 NOTE — Progress Notes (Signed)
Subjective:  Patient ID: Devin Tran, male    DOB: 1965-07-13  Age: 51 y.o. MRN: [id number]  CC: Annual Exam (pt here today for CPE )   HPI Devin Tran presents for CPE. Concerned about loss of libido & hypogonadism.  follow-up of hypertension. Patient has no history of headache chest pain or shortness of breath or recent cough. Patient also denies symptoms of TIA such as numbness weakness lateralizing. Patient checks  blood pressure at home and has not had any elevated readings recently. Patient denies side effects from his medication. States taking it regularly.    History Devin Tran has a past medical history of Erectile dysfunction; Hodgkin's lymphoma ([place]); Hyperlipidemia; Hypertension; Infertility; and Low testosterone.   He has a past surgical history that includes Splenectomy (Left, Over 20 years ago).   His family history includes COPD in his father; Cancer in his father; Diabetes in his father; Hypertension in his brother, father, and mother; Stroke in his brother.He reports that he has never smoked. He has never used smokeless tobacco. He reports that he does not drink alcohol or use drugs.    ROS Review of Systems  Constitutional: Negative for activity change, appetite change, chills, diaphoresis, fatigue, fever and unexpected weight change.  HENT: Negative for congestion, ear pain, hearing loss, postnasal drip, rhinorrhea, sore throat, tinnitus and trouble swallowing.   Eyes: Negative for photophobia, pain, discharge and redness.  Respiratory: Negative for apnea, cough, choking, chest tightness, shortness of breath, wheezing and stridor.   Cardiovascular: Negative for chest pain, palpitations and leg swelling.  Gastrointestinal: Negative for abdominal distention, abdominal pain, blood in stool, constipation, diarrhea, nausea and vomiting.  Endocrine: Negative for cold intolerance, heat intolerance, polydipsia, polyphagia and polyuria.  Genitourinary: Negative for difficulty  urinating, dysuria, enuresis, flank pain, frequency, genital sores, hematuria and urgency.  Musculoskeletal: Negative for arthralgias and joint swelling.  Skin: Negative for color change, rash and wound.  Allergic/Immunologic: Negative for immunocompromised state.  Neurological: Negative for dizziness, tremors, seizures, syncope, facial asymmetry, speech difficulty, weakness, light-headedness, numbness and headaches.  Hematological: Does not bruise/bleed easily.  Psychiatric/Behavioral: Negative for agitation, behavioral problems, confusion, decreased concentration, dysphoric mood, hallucinations, sleep disturbance and suicidal ideas. The patient is not nervous/anxious and is not hyperactive.     Objective:  BP 133/87   Pulse 77   Temp 99 F (37.2 C) (Oral)   Ht '6\' 2"'  (1.88 m)   Wt 240 lb (108.9 kg)   BMI 30.81 kg/m   BP Readings from Last 3 Encounters:  05/12/16 133/87  04/02/15 124/79  09/28/14 133/87    Wt Readings from Last 3 Encounters:  05/12/16 240 lb (108.9 kg)  04/02/15 234 lb 9.6 oz (106.4 kg)  09/28/14 236 lb 6.4 oz (107.2 kg)     Physical Exam  Constitutional: He is oriented to person, place, and time. He appears well-developed and well-nourished.  HENT:  Head: Normocephalic and atraumatic.  Mouth/Throat: Oropharynx is clear and moist.  Eyes: EOM are normal. Pupils are equal, round, and reactive to light.  Neck: Normal range of motion. No tracheal deviation present. No thyromegaly present.  Cardiovascular: Normal rate, regular rhythm and normal heart sounds.  Exam reveals no gallop and no friction rub.   No murmur heard. Pulmonary/Chest: Breath sounds normal. He has no wheezes. He has no rales.  Abdominal: Soft. He exhibits no mass. There is no tenderness.  Musculoskeletal: Normal range of motion. He exhibits no edema.  Neurological: He is alert and oriented to person, place,  and time.  Skin: Skin is warm and dry.  Psychiatric: He has a normal mood and  affect.      Assessment & Plan:   Devin Tran was seen today for annual exam.  Diagnoses and all orders for this visit:  Well adult exam -     CBC with Differential/Platelet -     CMP14+EGFR -     Lipid panel -     TSH -     Testosterone,Free and Total -     Urinalysis  Essential hypertension -     CBC with Differential/Platelet -     CMP14+EGFR  Mixed hyperlipidemia -     CBC with Differential/Platelet -     CMP14+EGFR -     Lipid panel  Hypogonadism male -     CBC with Differential/Platelet -     CMP14+EGFR -     TSH -     Testosterone,Free and Total       I am having [name] maintain his amLODipine.  Allergies as of 05/12/2016      Reactions   Bleomycin    Cramping all over      Medication List       Accurate as of 05/12/16  8:59 PM. Always use your most recent med list.          amLODipine 5 MG tablet Commonly known as:  NORVASC TAKE 1 TABLET (5 MG TOTAL) BY MOUTH DAILY.        Follow-up: Return in about 6 months (around 11/11/2016).  [name], M.D.

## 2016-05-13 ENCOUNTER — Other Ambulatory Visit: Payer: Self-pay | Admitting: Family Medicine

## 2016-05-13 LAB — LIPID PANEL
CHOLESTEROL TOTAL: 209 mg/dL — AB (ref 100–199)
Chol/HDL Ratio: 4.9 ratio (ref 0.0–5.0)
HDL: 43 mg/dL (ref >39)
LDL CALC: 134 mg/dL — AB (ref 0–99)
TRIGLYCERIDES: 159 mg/dL — AB (ref 0–149)
VLDL Cholesterol Cal: 32 mg/dL (ref 5–40)

## 2016-05-13 LAB — CMP14+EGFR
ALK PHOS: 73 IU/L (ref 39–117)
ALT: 39 IU/L (ref 0–44)
AST: 24 IU/L (ref 0–40)
Albumin/Globulin Ratio: 1.8 (ref 1.2–2.2)
Albumin: 4.8 g/dL (ref 3.5–5.5)
BUN/Creatinine Ratio: 14 (ref 9–20)
BUN: 16 mg/dL (ref 6–24)
Bilirubin Total: 0.4 mg/dL (ref 0.0–1.2)
CO2: 25 mmol/L (ref 18–29)
CREATININE: 1.12 mg/dL (ref 0.76–1.27)
Calcium: 10.1 mg/dL (ref 8.7–10.2)
Chloride: 101 mmol/L (ref 96–106)
GFR calc Af Amer: 89 mL/min/{1.73_m2} (ref >59)
GFR calc non Af Amer: 77 mL/min/{1.73_m2} (ref >59)
GLOBULIN, TOTAL: 2.7 g/dL (ref 1.5–4.5)
Glucose: 137 mg/dL — ABNORMAL HIGH (ref 65–99)
POTASSIUM: 4.2 mmol/L (ref 3.5–5.2)
SODIUM: 143 mmol/L (ref 134–144)
Total Protein: 7.5 g/dL (ref 6.0–8.5)

## 2016-05-13 LAB — TESTOSTERONE,FREE AND TOTAL
TESTOSTERONE FREE: 6.9 pg/mL (ref 6.8–21.5)
Testosterone: 306 ng/dL (ref 264–916)

## 2016-05-13 LAB — CBC WITH DIFFERENTIAL/PLATELET
BASOS ABS: 0 10*3/uL (ref 0.0–0.2)
BASOS: 0 %
EOS (ABSOLUTE): 0.1 10*3/uL (ref 0.0–0.4)
Eos: 1 %
HEMATOCRIT: 47 % (ref 37.5–51.0)
Hemoglobin: 16.3 g/dL (ref 13.0–17.7)
IMMATURE GRANULOCYTES: 0 %
Immature Grans (Abs): 0 10*3/uL (ref 0.0–0.1)
LYMPHS: 53 %
Lymphocytes Absolute: 3 10*3/uL (ref 0.7–3.1)
MCH: 28.8 pg (ref 26.6–33.0)
MCHC: 34.7 g/dL (ref 31.5–35.7)
MCV: 83 fL (ref 79–97)
MONOCYTES: 10 %
Monocytes Absolute: 0.6 10*3/uL (ref 0.1–0.9)
NEUTROS PCT: 36 %
Neutrophils Absolute: 2 10*3/uL (ref 1.4–7.0)
PLATELETS: 250 10*3/uL (ref 150–379)
RBC: 5.65 x10E6/uL (ref 4.14–5.80)
RDW: 15.5 % — AB (ref 12.3–15.4)
WBC: 5.7 10*3/uL (ref 3.4–10.8)

## 2016-05-13 LAB — TSH: TSH: 1.34 u[IU]/mL (ref 0.450–4.500)

## 2016-11-11 ENCOUNTER — Ambulatory Visit (INDEPENDENT_AMBULATORY_CARE_PROVIDER_SITE_OTHER): Payer: BLUE CROSS/BLUE SHIELD | Admitting: Family Medicine

## 2016-11-11 ENCOUNTER — Encounter: Payer: Self-pay | Admitting: Family Medicine

## 2016-11-11 VITALS — BP 136/85 | HR 66 | Temp 98.0°F | Ht 74.0 in | Wt 238.0 lb

## 2016-11-11 DIAGNOSIS — Z125 Encounter for screening for malignant neoplasm of prostate: Secondary | ICD-10-CM

## 2016-11-11 DIAGNOSIS — E782 Mixed hyperlipidemia: Secondary | ICD-10-CM

## 2016-11-11 DIAGNOSIS — Z23 Encounter for immunization: Secondary | ICD-10-CM | POA: Diagnosis not present

## 2016-11-11 DIAGNOSIS — E291 Testicular hypofunction: Secondary | ICD-10-CM | POA: Diagnosis not present

## 2016-11-11 DIAGNOSIS — I10 Essential (primary) hypertension: Secondary | ICD-10-CM | POA: Diagnosis not present

## 2016-11-11 MED ORDER — AMLODIPINE BESYLATE 5 MG PO TABS: ORAL_TABLET | ORAL | 11 refills | Status: DC

## 2016-11-11 NOTE — Progress Notes (Signed)
Subjective:  Patient ID: Devin Tran, male    DOB: [birth year]  Age: 51 y.o. MRN: [id number]  CC: Hypertension (pt here today for routine follow up of his chronic medical conditions, no other concerns voiced.)   HPI Devin Tran presents for  follow-up of hypertension. Patient has no history of headache chest pain or shortness of breath or recent cough. Patient also denies symptoms of TIA such as focal numbness or weakness. Patient checks  blood pressure at home. Readings recently borderline. Patient denies side effects from medication. States taking it regularly.    Patient is concerned about safety of treatments for his hypogonadism. His last readings were actually in the normal range although right at the bottom. Energy is fair. He says that he lost some weight but he's gained it back since last visit.  Patient says that he has a brother who's had a heart attack however the brother smoked. The patient is not a smoker. His risk factors are moderate therefore he was opted to control his cholesterol with diet for now. We will check today to see if it's come down some over the last few months.  History Devin Tran has a past medical history of Erectile dysfunction; Hodgkin's lymphoma ([place]); Hyperlipidemia; Hypertension; Infertility; and Low testosterone.   He has a past surgical history that includes Splenectomy (Left, Over 20 years ago).   His family history includes COPD in his father; Cancer in his father; Diabetes in his father; Hypertension in his brother, father, and mother; Stroke in his brother.He reports that he has never smoked. He has never used smokeless tobacco. He reports that he does not drink alcohol or use drugs.  No current outpatient prescriptions on file prior to visit.   No current facility-administered medications on file prior to visit.     ROS Review of Systems  Constitutional: Negative for chills, diaphoresis, fever and unexpected weight change.  HENT: Negative for  congestion, hearing loss, rhinorrhea and sore throat.   Eyes: Negative for visual disturbance.  Respiratory: Negative for cough and shortness of breath.   Cardiovascular: Negative for chest pain.  Gastrointestinal: Negative for abdominal pain, constipation and diarrhea.  Genitourinary: Negative for dysuria and flank pain.  Musculoskeletal: Negative for arthralgias and joint swelling.  Skin: Negative for rash.  Neurological: Negative for dizziness and headaches.  Psychiatric/Behavioral: Negative for dysphoric mood and sleep disturbance.    Objective:  BP 136/85   Pulse 66   Temp 98 F (36.7 C) (Oral)   Ht 6' 2" (1.88 m)   Wt 238 lb (108 kg)   BMI 30.56 kg/m   BP Readings from Last 3 Encounters:  11/11/16 136/85  05/12/16 133/87  04/02/15 124/79    Wt Readings from Last 3 Encounters:  11/11/16 238 lb (108 kg)  05/12/16 240 lb (108.9 kg)  04/02/15 234 lb 9.6 oz (106.4 kg)     Physical Exam  Constitutional: He is oriented to person, place, and time. He appears well-developed and well-nourished. No distress.  HENT:  Head: Normocephalic and atraumatic.  Right Ear: External ear normal.  Left Ear: External ear normal.  Nose: Nose normal.  Mouth/Throat: Oropharynx is clear and moist.  Eyes: Pupils are equal, round, and reactive to light. Conjunctivae and EOM are normal.  Neck: Normal range of motion. Neck supple. No thyromegaly present.  Cardiovascular: Normal rate, regular rhythm and normal heart sounds.   No murmur heard. Pulmonary/Chest: Effort normal and breath sounds normal. No respiratory distress. He has no wheezes. He has  no rales.  Abdominal: Soft. Bowel sounds are normal. He exhibits no distension. There is no tenderness.  Lymphadenopathy:    He has no cervical adenopathy.  Neurological: He is alert and oriented to person, place, and time. He has normal reflexes.  Skin: Skin is warm and dry.  Psychiatric: He has a normal mood and affect. His behavior is normal.  Judgment and thought content normal.      Assessment & Plan:   Devin Tran was seen today for hypertension.  Diagnoses and all orders for this visit:  Essential hypertension  Mixed hyperlipidemia -     CMP14+EGFR -     Lipid panel  Hypogonadism male -     Testosterone,Free and Total  Screening for prostate cancer -     PSA, total and free  Need for immunization against influenza -     Flu Vaccine QUAD 36+ mos IM  Other orders -     amLODipine (NORVASC) 5 MG tablet; TAKE 1 TABLET (5 MG TOTAL) BY MOUTH DAILY.   Allergies as of 11/11/2016      Reactions   Bleomycin    Cramping all over      Medication List       Accurate as of 11/11/16  9:04 AM. Always use your most recent med list.          amLODipine 5 MG tablet Commonly known as:  NORVASC TAKE 1 TABLET (5 MG TOTAL) BY MOUTH DAILY.       Meds ordered this encounter  Medications  . amLODipine (NORVASC) 5 MG tablet    Sig: TAKE 1 TABLET (5 MG TOTAL) BY MOUTH DAILY.    Dispense:  30 tablet    Refill:  11    Keep log of BP readings and bring to follow up  Follow-up: Return in about 8 months (around 07/12/2017) for Wellness, hypertension, cholesterol.   , M.D. 

## 2016-11-11 NOTE — Patient Instructions (Signed)
Pick up Aspirin 81 mg and take 1 by mouth everyday.   DASH Eating Plan DASH stands for "Dietary Approaches to Stop Hypertension." The DASH eating plan is a healthy eating plan that has been shown to reduce high blood pressure (hypertension). It may also reduce your risk for type 2 diabetes, heart disease, and stroke. The DASH eating plan may also help with weight loss. What are tips for following this plan? General guidelines  Avoid eating more than 2,300 mg (milligrams) of salt (sodium) a day. If you have hypertension, you may need to reduce your sodium intake to 1,500 mg a day.  Limit alcohol intake to no more than 1 drink a day for nonpregnant women and 2 drinks a day for men. One drink equals 12 oz of beer, 5 oz of wine, or 1 oz of hard liquor.  Work with your health care provider to maintain a healthy body weight or to lose weight. Ask what an ideal weight is for you.  Get at least 30 minutes of exercise that causes your heart to beat faster (aerobic exercise) most days of the week. Activities may include walking, swimming, or biking.  Work with your health care provider or diet and nutrition specialist (dietitian) to adjust your eating plan to your individual calorie needs. Reading food labels  Check food labels for the amount of sodium per serving. Choose foods with less than 5 percent of the Daily Value of sodium. Generally, foods with less than 300 mg of sodium per serving fit into this eating plan.  To find whole grains, look for the word "whole" as the first word in the ingredient list. Shopping  Buy products labeled as "low-sodium" or "no salt added."  Buy fresh foods. Avoid canned foods and premade or frozen meals. Cooking  Avoid adding salt when cooking. Use salt-free seasonings or herbs instead of table salt or sea salt. Check with your health care provider or pharmacist before using salt substitutes.  Do not fry foods. Cook foods using healthy methods such as baking,  boiling, grilling, and broiling instead.  Cook with heart-healthy oils, such as olive, canola, soybean, or sunflower oil. Meal planning   Eat a balanced diet that includes: ? 5 or more servings of fruits and vegetables each day. At each meal, try to fill half of your plate with fruits and vegetables. ? Up to 6-8 servings of whole grains each day. ? Less than 6 oz of lean meat, poultry, or fish each day. A 3-oz serving of meat is about the same size as a deck of cards. One egg equals 1 oz. ? 2 servings of low-fat dairy each day. ? A serving of nuts, seeds, or beans 5 times each week. ? Heart-healthy fats. Healthy fats called Omega-3 fatty acids are found in foods such as flaxseeds and coldwater fish, like sardines, salmon, and mackerel.  Limit how much you eat of the following: ? Canned or prepackaged foods. ? Food that is high in trans fat, such as fried foods. ? Food that is high in saturated fat, such as fatty meat. ? Sweets, desserts, sugary drinks, and other foods with added sugar. ? Full-fat dairy products.  Do not salt foods before eating.  Try to eat at least 2 vegetarian meals each week.  Eat more home-cooked food and less restaurant, buffet, and fast food.  When eating at a restaurant, ask that your food be prepared with less salt or no salt, if possible. What foods are recommended? The items  listed may not be a complete list. Talk with your dietitian about what dietary choices are best for you. Grains Whole-grain or whole-wheat bread. Whole-grain or whole-wheat pasta. Raigoza rice. [place]. Bulgur. Whole-grain and low-sodium cereals. Pita bread. Low-fat, low-sodium crackers. Whole-wheat flour tortillas. Vegetables Fresh or frozen vegetables (raw, steamed, roasted, or grilled). Low-sodium or reduced-sodium tomato and vegetable juice. Low-sodium or reduced-sodium tomato sauce and tomato paste. Low-sodium or reduced-sodium canned vegetables. Fruits All fresh, dried, or  frozen fruit. Canned fruit in natural juice (without added sugar). Meat and other protein foods Skinless chicken or [place]. Ground chicken or [place]. Pork with fat trimmed off. Fish and seafood. Egg whites. Dried beans, peas, or lentils. Unsalted nuts, nut butters, and seeds. Unsalted canned beans. Lean cuts of beef with fat trimmed off. Low-sodium, lean deli meat. Dairy Low-fat (1%) or fat-free (skim) milk. Fat-free, low-fat, or reduced-fat cheeses. Nonfat, low-sodium ricotta or cottage cheese. Low-fat or nonfat yogurt. Low-fat, low-sodium cheese. Fats and oils Soft margarine without trans fats. Vegetable oil. Low-fat, reduced-fat, or light mayonnaise and salad dressings (reduced-sodium). Canola, safflower, olive, soybean, and sunflower oils. Avocado. Seasoning and other foods Herbs. Spices. Seasoning mixes without salt. Unsalted popcorn and pretzels. Fat-free sweets. What foods are not recommended? The items listed may not be a complete list. Talk with your dietitian about what dietary choices are best for you. Grains Baked goods made with fat, such as croissants, muffins, or some breads. Dry pasta or rice meal packs. Vegetables Creamed or fried vegetables. Vegetables in a cheese sauce. Regular canned vegetables (not low-sodium or reduced-sodium). Regular canned tomato sauce and paste (not low-sodium or reduced-sodium). Regular tomato and vegetable juice (not low-sodium or reduced-sodium). Angie Fava. Olives. Fruits Canned fruit in a light or heavy syrup. Fried fruit. Fruit in cream or butter sauce. Meat and other protein foods Fatty cuts of meat. Ribs. Fried meat. Berniece Salines. Sausage. Bologna and other processed lunch meats. Salami. Fatback. Hotdogs. Bratwurst. Salted nuts and seeds. Canned beans with added salt. Canned or smoked fish. Whole eggs or egg yolks. Chicken or [place] with skin. Dairy Whole or 2% milk, cream, and half-and-half. Whole or full-fat cream cheese. Whole-fat or sweetened yogurt.  Full-fat cheese. Nondairy creamers. Whipped toppings. Processed cheese and cheese spreads. Fats and oils Butter. Stick margarine. Lard. Shortening. Ghee. Bacon fat. Tropical oils, such as coconut, palm kernel, or palm oil. Seasoning and other foods Salted popcorn and pretzels. Onion salt, garlic salt, seasoned salt, table salt, and sea salt. Worcestershire sauce. Tartar sauce. Barbecue sauce. Teriyaki sauce. Soy sauce, including reduced-sodium. Steak sauce. Canned and packaged gravies. Fish sauce. Oyster sauce. Cocktail sauce. Horseradish that you find on the shelf. Ketchup. Mustard. Meat flavorings and tenderizers. Bouillon cubes. Hot sauce and Tabasco sauce. Premade or packaged marinades. Premade or packaged taco seasonings. Relishes. Regular salad dressings. Where to find more information:  National Heart, Lung, and [place]: https://wilson-eaton.com/  American Heart Association: www.heart.org Summary  The DASH eating plan is a healthy eating plan that has been shown to reduce high blood pressure (hypertension). It may also reduce your risk for type 2 diabetes, heart disease, and stroke.  With the DASH eating plan, you should limit salt (sodium) intake to 2,300 mg a day. If you have hypertension, you may need to reduce your sodium intake to 1,500 mg a day.  When on the DASH eating plan, aim to eat more fresh fruits and vegetables, whole grains, lean proteins, low-fat dairy, and heart-healthy fats.  Work with your health care provider or  diet and nutrition specialist (dietitian) to adjust your eating plan to your individual calorie needs. This information is not intended to replace advice given to you by your health care provider. Make sure you discuss any questions you have with your health care provider. Document Released: 01/09/2011 Document Revised: 01/14/2016 Document Reviewed: 01/14/2016 Elsevier Interactive Patient Education  2017 [employer].

## 2016-11-12 LAB — CMP14+EGFR
ALT: 32 IU/L (ref 0–44)
AST: 21 IU/L (ref 0–40)
Albumin/Globulin Ratio: 1.9 (ref 1.2–2.2)
Albumin: 4.7 g/dL (ref 3.5–5.5)
Alkaline Phosphatase: 77 IU/L (ref 39–117)
BUN/Creatinine Ratio: 14 (ref 9–20)
BUN: 15 mg/dL (ref 6–24)
Bilirubin Total: 0.5 mg/dL (ref 0.0–1.2)
CALCIUM: 9.9 mg/dL (ref 8.7–10.2)
CO2: 22 mmol/L (ref 20–29)
Chloride: 103 mmol/L (ref 96–106)
Creatinine, Ser: 1.09 mg/dL (ref 0.76–1.27)
GFR calc Af Amer: 92 mL/min/{1.73_m2} (ref >59)
GFR, EST NON AFRICAN AMERICAN: 79 mL/min/{1.73_m2} (ref >59)
Globulin, Total: 2.5 g/dL (ref 1.5–4.5)
Glucose: 108 mg/dL — ABNORMAL HIGH (ref 65–99)
Potassium: 4.6 mmol/L (ref 3.5–5.2)
Sodium: 141 mmol/L (ref 134–144)
TOTAL PROTEIN: 7.2 g/dL (ref 6.0–8.5)

## 2016-11-12 LAB — LIPID PANEL
CHOL/HDL RATIO: 4.4 ratio (ref 0.0–5.0)
Cholesterol, Total: 182 mg/dL (ref 100–199)
HDL: 41 mg/dL (ref >39)
LDL Calculated: 123 mg/dL — ABNORMAL HIGH (ref 0–99)
TRIGLYCERIDES: 89 mg/dL (ref 0–149)
VLDL Cholesterol Cal: 18 mg/dL (ref 5–40)

## 2016-11-12 LAB — PSA, TOTAL AND FREE
PROSTATE SPECIFIC AG, SERUM: 0.5 ng/mL (ref 0.0–4.0)
PSA FREE PCT: 62 %
PSA, Free: 0.31 ng/mL

## 2016-11-12 LAB — TESTOSTERONE,FREE AND TOTAL
TESTOSTERONE: 350 ng/dL (ref 264–916)
Testosterone, Free: 7.8 pg/mL (ref 6.8–21.5)

## 2017-04-10 DIAGNOSIS — Z6832 Body mass index (BMI) 32.0-32.9, adult: Secondary | ICD-10-CM | POA: Diagnosis not present

## 2017-04-10 DIAGNOSIS — B379 Candidiasis, unspecified: Secondary | ICD-10-CM | POA: Diagnosis not present

## 2017-07-13 ENCOUNTER — Ambulatory Visit (INDEPENDENT_AMBULATORY_CARE_PROVIDER_SITE_OTHER): Payer: BLUE CROSS/BLUE SHIELD | Admitting: Family Medicine

## 2017-07-13 ENCOUNTER — Encounter (INDEPENDENT_AMBULATORY_CARE_PROVIDER_SITE_OTHER): Payer: Self-pay | Admitting: *Deleted

## 2017-07-13 ENCOUNTER — Encounter: Payer: Self-pay | Admitting: Family Medicine

## 2017-07-13 VITALS — BP 136/84 | HR 77 | Temp 98.7°F | Ht 74.0 in | Wt 235.5 lb

## 2017-07-13 DIAGNOSIS — Z125 Encounter for screening for malignant neoplasm of prostate: Secondary | ICD-10-CM | POA: Diagnosis not present

## 2017-07-13 DIAGNOSIS — Z1211 Encounter for screening for malignant neoplasm of colon: Secondary | ICD-10-CM

## 2017-07-13 DIAGNOSIS — Z0000 Encounter for general adult medical examination without abnormal findings: Secondary | ICD-10-CM | POA: Diagnosis not present

## 2017-07-13 DIAGNOSIS — E782 Mixed hyperlipidemia: Secondary | ICD-10-CM | POA: Diagnosis not present

## 2017-07-13 LAB — URINALYSIS
Bilirubin, UA: NEGATIVE
Glucose, UA: NEGATIVE
Ketones, UA: NEGATIVE
LEUKOCYTES UA: NEGATIVE
NITRITE UA: NEGATIVE
PH UA: 5.5 (ref 5.0–7.5)
Urobilinogen, Ur: 0.2 mg/dL (ref 0.2–1.0)

## 2017-07-13 MED ORDER — FLUCONAZOLE 100 MG PO TABS: ORAL_TABLET | ORAL | 0 refills | Status: DC

## 2017-07-13 NOTE — Patient Instructions (Signed)

## 2017-07-13 NOTE — Progress Notes (Signed)
Subjective:  Patient ID: Devin Tran, male    DOB: 02-02-1966  Age: 52 y.o. MRN: [id number]  CC: Annual Exam   HPI Devin Tran presents for complete physical examination.  Depression screen [facility] 2/9 07/13/2017 11/11/2016 05/12/2016  Decreased Interest 0 0 0  Down, Depressed, Hopeless 0 0 0  PHQ - 2 Score 0 0 0    History Devin Tran has a past medical history of Erectile dysfunction, Hodgkin's lymphoma ([place]), Hyperlipidemia, Hypertension, Infertility, and Low testosterone.   He has a past surgical history that includes Splenectomy (Left, Over 20 years ago).   His family history includes COPD in his father; Cancer in his father; Diabetes in his father; Hypertension in his brother, father, and mother; Stroke in his brother.He reports that he has never smoked. He has never used smokeless tobacco. He reports that he does not drink alcohol or use drugs.    ROS Review of Systems  Constitutional: Negative for activity change, appetite change, chills, diaphoresis, fatigue, fever and unexpected weight change.  HENT: Negative for congestion, ear pain, hearing loss, postnasal drip, rhinorrhea, sore throat, tinnitus and trouble swallowing.   Eyes: Negative for photophobia, pain, discharge and redness.  Respiratory: Negative for apnea, cough, choking, chest tightness, shortness of breath, wheezing and stridor.   Cardiovascular: Negative for chest pain, palpitations and leg swelling.  Gastrointestinal: Negative for abdominal distention, abdominal pain, blood in stool, constipation, diarrhea, nausea and vomiting.  Endocrine: Negative for cold intolerance, heat intolerance, polydipsia, polyphagia and polyuria.  Genitourinary: Negative for difficulty urinating, dysuria, enuresis, flank pain, frequency, genital sores, hematuria and urgency.  Musculoskeletal: Negative for arthralgias and joint swelling.  Skin: Positive for rash (Recurrent rash in the groin around the scrotal area and inner thighs.). Negative  for color change and wound.  Allergic/Immunologic: Negative for immunocompromised state.  Neurological: Negative for dizziness, tremors, seizures, syncope, facial asymmetry, speech difficulty, weakness, light-headedness, numbness and headaches.  Hematological: Does not bruise/bleed easily.  Psychiatric/Behavioral: Negative for agitation, behavioral problems, confusion, decreased concentration, dysphoric mood, hallucinations, sleep disturbance and suicidal ideas. The patient is not nervous/anxious and is not hyperactive.     Objective:  BP 136/84   Pulse 77   Temp 98.7 F (37.1 C) (Oral)   Ht _0  (1.88 m)   Wt 235 lb 8 oz (106.8 kg)   BMI 30.24 kg/m   BP Readings from Last 3 Encounters:  07/13/17 136/84  11/11/16 136/85  05/12/16 133/87    Wt Readings from Last 3 Encounters:  07/13/17 235 lb 8 oz (106.8 kg)  11/11/16 238 lb (108 kg)  05/12/16 240 lb (108.9 kg)     Physical Exam  Constitutional: He is oriented to person, place, and time. He appears well-developed and well-nourished.  HENT:  Head: Normocephalic and atraumatic.  Mouth/Throat: Oropharynx is clear and moist.  Eyes: Pupils are equal, round, and reactive to light. EOM are normal.  Neck: Normal range of motion. No tracheal deviation present. No thyromegaly present.  Cardiovascular: Normal rate, regular rhythm and normal heart sounds. Exam reveals no gallop and no friction rub.  No murmur heard. Pulmonary/Chest: Breath sounds normal. He has no wheezes. He has no rales.  Abdominal: Soft. He exhibits no mass. There is no tenderness.  Musculoskeletal: Normal range of motion. He exhibits no edema.  Neurological: He is alert and oriented to person, place, and time.  Skin: Skin is warm and dry. Rash (Typical tinea cruris rash in the groin area onto the inner thighs approximately with erythema minimal  satellite formation.  This is likely due to partial treatment) noted.  Psychiatric: He has a normal mood and affect.       Assessment & Plan:   Devin Tran was seen today for annual exam.  Diagnoses and all orders for this visit:  Well adult exam -     CBC with Differential/Platelet -     CMP14+EGFR -     Lipid panel -     PSA, total and free -     VITAMIN D 25 Hydroxy (Vit-D Deficiency, Fractures) -     Urinalysis -     Ambulatory referral to Gastroenterology  Screening for colon cancer -     Ambulatory referral to Gastroenterology  Screening for prostate cancer -     PSA, total and free  Mixed hyperlipidemia -     Lipid panel  Other orders -     fluconazole (DIFLUCAN) 100 MG tablet; Take 2 today then 1 daily for 13 days.       I am having Devin Tran start on fluconazole. I am also having him maintain his amLODipine.  Allergies as of 07/13/2017      Reactions   Bleomycin    Cramping all over      Medication List        Accurate as of 07/13/17  6:33 PM. Always use your most recent med list.          amLODipine 5 MG tablet Commonly known as:  NORVASC TAKE 1 TABLET (5 MG TOTAL) BY MOUTH DAILY.   fluconazole 100 MG tablet Commonly known as:  DIFLUCAN Take 2 today then 1 daily for 13 days.        Follow-up: Return in about 1 year (around 07/14/2018).  [name], M.D.

## 2017-07-14 LAB — CMP14+EGFR
A/G RATIO: 1.8 (ref 1.2–2.2)
ALT: 29 IU/L (ref 0–44)
AST: 19 IU/L (ref 0–40)
Albumin: 4.8 g/dL (ref 3.5–5.5)
Alkaline Phosphatase: 72 IU/L (ref 39–117)
BILIRUBIN TOTAL: 0.4 mg/dL (ref 0.0–1.2)
BUN/Creatinine Ratio: 12 (ref 9–20)
BUN: 14 mg/dL (ref 6–24)
CALCIUM: 10.1 mg/dL (ref 8.7–10.2)
CO2: 22 mmol/L (ref 20–29)
Chloride: 105 mmol/L (ref 96–106)
Creatinine, Ser: 1.16 mg/dL (ref 0.76–1.27)
GFR, EST AFRICAN AMERICAN: 84 mL/min/{1.73_m2} (ref >59)
GFR, EST NON AFRICAN AMERICAN: 73 mL/min/{1.73_m2} (ref >59)
GLOBULIN, TOTAL: 2.6 g/dL (ref 1.5–4.5)
Glucose: 99 mg/dL (ref 65–99)
POTASSIUM: 4.3 mmol/L (ref 3.5–5.2)
Sodium: 143 mmol/L (ref 134–144)
Total Protein: 7.4 g/dL (ref 6.0–8.5)

## 2017-07-14 LAB — VITAMIN D 25 HYDROXY (VIT D DEFICIENCY, FRACTURES): Vit D, 25-Hydroxy: 27.5 ng/mL — ABNORMAL LOW (ref 30.0–100.0)

## 2017-07-14 LAB — CBC WITH DIFFERENTIAL/PLATELET
BASOS: 1 %
Basophils Absolute: 0 10*3/uL (ref 0.0–0.2)
EOS (ABSOLUTE): 0.2 10*3/uL (ref 0.0–0.4)
EOS: 4 %
Hematocrit: 48 % (ref 37.5–51.0)
Hemoglobin: 15.7 g/dL (ref 13.0–17.7)
IMMATURE GRANS (ABS): 0 10*3/uL (ref 0.0–0.1)
IMMATURE GRANULOCYTES: 0 %
LYMPHS: 57 %
Lymphocytes Absolute: 3 10*3/uL (ref 0.7–3.1)
MCH: 27.4 pg (ref 26.6–33.0)
MCHC: 32.7 g/dL (ref 31.5–35.7)
MCV: 84 fL (ref 79–97)
Monocytes Absolute: 0.5 10*3/uL (ref 0.1–0.9)
Monocytes: 10 %
NEUTROS ABS: 1.5 10*3/uL (ref 1.4–7.0)
NEUTROS PCT: 28 %
PLATELETS: 233 10*3/uL (ref 150–450)
RBC: 5.73 x10E6/uL (ref 4.14–5.80)
RDW: 14.9 % (ref 12.3–15.4)
WBC: 5.2 10*3/uL (ref 3.4–10.8)

## 2017-07-14 LAB — LIPID PANEL
CHOL/HDL RATIO: 5.1 ratio — AB (ref 0.0–5.0)
Cholesterol, Total: 208 mg/dL — ABNORMAL HIGH (ref 100–199)
HDL: 41 mg/dL (ref >39)
LDL Calculated: 138 mg/dL — ABNORMAL HIGH (ref 0–99)
Triglycerides: 143 mg/dL (ref 0–149)
VLDL Cholesterol Cal: 29 mg/dL (ref 5–40)

## 2017-07-14 LAB — PSA, TOTAL AND FREE
PROSTATE SPECIFIC AG, SERUM: 0.5 ng/mL (ref 0.0–4.0)
PSA FREE: 0.31 ng/mL
PSA, Free Pct: 62 %

## 2017-07-30 ENCOUNTER — Encounter: Payer: Self-pay | Admitting: *Deleted

## 2017-08-11 ENCOUNTER — Ambulatory Visit: Payer: BLUE CROSS/BLUE SHIELD | Admitting: Family Medicine

## 2017-08-17 ENCOUNTER — Telehealth: Payer: Self-pay | Admitting: Family Medicine

## 2017-08-17 ENCOUNTER — Other Ambulatory Visit: Payer: Self-pay | Admitting: Family Medicine

## 2017-08-17 MED ORDER — FLUCONAZOLE 100 MG PO TABS: ORAL_TABLET | ORAL | 0 refills | Status: DC

## 2017-08-17 NOTE — Telephone Encounter (Signed)
What is the name of the medication? Diflucan for Jock itch  Have you contacted your pharmacy to request a refill? NO  Which pharmacy would you like this sent to? [facility] in [place]   Patient notified that their request is being sent to the clinical staff for review and that they should receive a call once it is complete. If they do not receive a call within 24 hours they can check with their pharmacy or our office.

## 2017-08-17 NOTE — Telephone Encounter (Signed)
I sent in the requested prescription 

## 2017-08-17 NOTE — Telephone Encounter (Signed)
Pt aware rx sent in. 

## 2017-12-19 ENCOUNTER — Other Ambulatory Visit: Payer: Self-pay | Admitting: Family Medicine

## 2018-01-12 ENCOUNTER — Ambulatory Visit (INDEPENDENT_AMBULATORY_CARE_PROVIDER_SITE_OTHER): Payer: BLUE CROSS/BLUE SHIELD | Admitting: Family Medicine

## 2018-01-12 ENCOUNTER — Encounter: Payer: Self-pay | Admitting: Family Medicine

## 2018-01-12 VITALS — BP 133/83 | HR 79 | Temp 98.7°F | Ht 74.0 in | Wt 236.8 lb

## 2018-01-12 DIAGNOSIS — I10 Essential (primary) hypertension: Secondary | ICD-10-CM

## 2018-01-12 DIAGNOSIS — E291 Testicular hypofunction: Secondary | ICD-10-CM

## 2018-01-12 DIAGNOSIS — Z23 Encounter for immunization: Secondary | ICD-10-CM | POA: Diagnosis not present

## 2018-01-12 DIAGNOSIS — E782 Mixed hyperlipidemia: Secondary | ICD-10-CM | POA: Diagnosis not present

## 2018-01-12 MED ORDER — AMLODIPINE BESYLATE 5 MG PO TABS: ORAL_TABLET | ORAL | 1 refills | Status: DC

## 2018-01-12 NOTE — Progress Notes (Signed)
Subjective:  Patient ID: Devin Tran, male    DOB: 06-26-1965  Age: 52 y.o. MRN: [id number]  CC: Medication Refill   HPI Devin Tran presents for  follow-up of hypertension. Patient has no history of headache chest pain or shortness of breath or recent cough. Patient also denies symptoms of TIA such as focal numbness or weakness. Patient denies side effects from medication. States taking it regularly.  Patient tells me that it is occasionally high at home when checked.  Patient has had a low testosterone in the past is due for recheck of that he is not currently taking treatment for it but notes that he does have problems with his erections being infrequent not very firm and his libido is restricted.  Patient has had elevated cholesterol in the past not currently on treatment.  No problems with cardiovascular or cerebrovascular symptoms including TIA chest pain etc. History Devin Tran has a past medical history of Erectile dysfunction, Hodgkin's lymphoma ([place]), Hyperlipidemia, Hypertension, Infertility, and Low testosterone.   He has a past surgical history that includes Splenectomy (Left, Over 20 years ago).   His family history includes COPD in his father; Cancer in his father; Diabetes in his father; Hypertension in his brother, father, and mother; Stroke in his brother.He reports that he has never smoked. He has never used smokeless tobacco. He reports that he does not drink alcohol or use drugs.  No current outpatient medications on file prior to visit.   No current facility-administered medications on file prior to visit.     ROS Review of Systems  Constitutional: Negative for fever.  Respiratory: Negative for shortness of breath.   Cardiovascular: Negative for chest pain.  Musculoskeletal: Negative for arthralgias.  Skin: Negative for rash.    Objective:  BP 133/83   Pulse 79   Temp 98.7 F (37.1 C) (Oral)   Ht _0  (1.88 m)   Wt 236 lb 12.8 oz (107.4 kg)   BMI 30.40 kg/m    BP Readings from Last 3 Encounters:  01/12/18 133/83  07/13/17 136/84  11/11/16 136/85    Wt Readings from Last 3 Encounters:  01/12/18 236 lb 12.8 oz (107.4 kg)  07/13/17 235 lb 8 oz (106.8 kg)  11/11/16 238 lb (108 kg)     Physical Exam  Constitutional: He is oriented to person, place, and time. He appears well-developed and well-nourished. No distress.  HENT:  Head: Normocephalic and atraumatic.  Right Ear: External ear normal.  Left Ear: External ear normal.  Nose: Nose normal.  Mouth/Throat: Oropharynx is clear and moist.  Eyes: Pupils are equal, round, and reactive to light. Conjunctivae and EOM are normal.  Neck: Normal range of motion. Neck supple.  Cardiovascular: Normal rate, regular rhythm and normal heart sounds.  No murmur heard. Pulmonary/Chest: Effort normal and breath sounds normal. No respiratory distress. He has no wheezes. He has no rales.  Abdominal: Soft. There is no tenderness.  Musculoskeletal: Normal range of motion.  Neurological: He is alert and oriented to person, place, and time. He has normal reflexes.  Skin: Skin is warm and dry.  Psychiatric: He has a normal mood and affect. His behavior is normal. Judgment and thought content normal.      Assessment & Plan:   Devin Tran was seen today for medication refill.  Diagnoses and all orders for this visit:  Essential hypertension -     CMP14+EGFR -     Lipid panel -     Testosterone,Free and Total  Mixed hyperlipidemia -     CMP14+EGFR -     Lipid panel -     Testosterone,Free and Total  Hypogonadism male -     CMP14+EGFR -     Lipid panel -     Testosterone,Free and Total  Need for immunization against influenza -     Flu Vaccine QUAD 36+ mos IM  Other orders -     amLODipine (NORVASC) 5 MG tablet; TAKE 1 TABLET BY MOUTH EVERY DAY   Allergies as of 01/12/2018      Reactions   Bleomycin    Cramping all over      Medication List        Accurate as of 01/12/18  5:41 PM.  Always use your most recent med list.          amLODipine 5 MG tablet Commonly known as:  NORVASC TAKE 1 TABLET BY MOUTH EVERY DAY       Meds ordered this encounter  Medications  . amLODipine (NORVASC) 5 MG tablet    Sig: TAKE 1 TABLET BY MOUTH EVERY DAY    Dispense:  90 tablet    Refill:  1      Follow-up: Return in about 6 months (around 07/14/2018) for Wellness.  [name], M.D.

## 2018-01-12 NOTE — Patient Instructions (Signed)
Get [employer] Blood Pressure Monitor and check Blood pressure 3-4 times weekly.  Blood pressure should be below 135/85

## 2018-01-13 LAB — LIPID PANEL
CHOLESTEROL TOTAL: 191 mg/dL (ref 100–199)
Chol/HDL Ratio: 4.3 ratio (ref 0.0–5.0)
HDL: 44 mg/dL (ref >39)
LDL CALC: 126 mg/dL — AB (ref 0–99)
TRIGLYCERIDES: 104 mg/dL (ref 0–149)
VLDL Cholesterol Cal: 21 mg/dL (ref 5–40)

## 2018-01-13 LAB — CMP14+EGFR
ALK PHOS: 78 IU/L (ref 39–117)
ALT: 30 IU/L (ref 0–44)
AST: 22 IU/L (ref 0–40)
Albumin/Globulin Ratio: 2.5 — ABNORMAL HIGH (ref 1.2–2.2)
Albumin: 5 g/dL (ref 3.5–5.5)
BUN/Creatinine Ratio: 12 (ref 9–20)
BUN: 14 mg/dL (ref 6–24)
Bilirubin Total: 0.5 mg/dL (ref 0.0–1.2)
CO2: 24 mmol/L (ref 20–29)
CREATININE: 1.13 mg/dL (ref 0.76–1.27)
Calcium: 10.2 mg/dL (ref 8.7–10.2)
Chloride: 100 mmol/L (ref 96–106)
GFR calc Af Amer: 87 mL/min/{1.73_m2} (ref >59)
GFR calc non Af Amer: 75 mL/min/{1.73_m2} (ref >59)
GLOBULIN, TOTAL: 2 g/dL (ref 1.5–4.5)
GLUCOSE: 104 mg/dL — AB (ref 65–99)
Potassium: 4.7 mmol/L (ref 3.5–5.2)
SODIUM: 140 mmol/L (ref 134–144)
Total Protein: 7 g/dL (ref 6.0–8.5)

## 2018-01-13 LAB — TESTOSTERONE,FREE AND TOTAL
Testosterone, Free: 6.8 pg/mL — ABNORMAL LOW (ref 7.2–24.0)
Testosterone: 373 ng/dL (ref 264–916)

## 2018-01-21 ENCOUNTER — Other Ambulatory Visit: Payer: Self-pay | Admitting: Family Medicine

## 2018-01-21 NOTE — Telephone Encounter (Signed)
Offered multiple appts for pt to be seen and evaluated and pt declined. Advised pt could try OTC anti fungal creams to see if that helped or to call back to schedule appt to be seen and evaluated.

## 2018-03-26 ENCOUNTER — Encounter: Payer: Self-pay | Admitting: *Deleted

## 2018-05-03 ENCOUNTER — Ambulatory Visit: Payer: BLUE CROSS/BLUE SHIELD | Admitting: Family Medicine

## 2018-05-03 ENCOUNTER — Ambulatory Visit (INDEPENDENT_AMBULATORY_CARE_PROVIDER_SITE_OTHER): Payer: BLUE CROSS/BLUE SHIELD | Admitting: Family Medicine

## 2018-05-03 ENCOUNTER — Encounter: Payer: Self-pay | Admitting: Family Medicine

## 2018-05-03 ENCOUNTER — Ambulatory Visit (INDEPENDENT_AMBULATORY_CARE_PROVIDER_SITE_OTHER): Payer: BLUE CROSS/BLUE SHIELD

## 2018-05-03 ENCOUNTER — Other Ambulatory Visit: Payer: Self-pay

## 2018-05-03 VITALS — BP 159/97 | HR 77 | Temp 100.4°F | Wt 240.0 lb

## 2018-05-03 DIAGNOSIS — R509 Fever, unspecified: Secondary | ICD-10-CM

## 2018-05-03 DIAGNOSIS — R591 Generalized enlarged lymph nodes: Secondary | ICD-10-CM

## 2018-05-03 DIAGNOSIS — Z8571 Personal history of Hodgkin lymphoma: Secondary | ICD-10-CM | POA: Diagnosis not present

## 2018-05-03 MED ORDER — AMOXICILLIN-POT CLAVULANATE 875-125 MG PO TABS: 1.0000 | ORAL_TABLET | Freq: Two times a day (BID) | ORAL | 0 refills | Status: AC

## 2018-05-03 NOTE — Progress Notes (Signed)
Subjective:  Patient ID: Devin Tran, male    DOB: [birth year], 53 y.o.   MRN: [id number]  Chief Complaint:  Knot under right arm   HPI: Devin Tran is a 53 y.o. male presenting on 05/03/2018 for Knot under right arm  Pt presents today for a knot under his right arm. Pt states he is not sure when this started. States it was not present in November 2019. States the area is not tender to touch, states just increasing in size. Pt has a history of Hodgkin's Lymphoma. States it has been over 10 years since last treatment. Pt denies fatigue, malaise, shortness of breath, chest pain, cough, chills, night sweats, weight loss, or pruritis. Pt denies having animals, specifically cats, in the house. No recent injuries. Pt states he was unaware of having a fever, temperature of 100.4 today. No other associated symptoms.   Relevant past medical, surgical, family, and social history reviewed and updated as indicated.  Allergies and medications reviewed and updated.   Past Medical History:  Diagnosis Date  . Erectile dysfunction   . Hodgkin's lymphoma ([place])   . Hyperlipidemia   . Hypertension   . Infertility   . Low testosterone     Past Surgical History:  Procedure Laterality Date  . SPLENECTOMY Left Over 20 years ago   Due to Hodgkin's lymphoma    Social History   Socioeconomic History  . Marital status: Single    Spouse name: Not on file  . Number of children: Not on file  . Years of education: Not on file  . Highest education level: Not on file  Occupational History  . Not on file  Social Needs  . Financial resource strain: Not on file  . Food insecurity:    Worry: Not on file    Inability: Not on file  . Transportation needs:    Medical: Not on file    Non-medical: Not on file  Tobacco Use  . Smoking status: Never Smoker  . Smokeless tobacco: Never Used  Substance and Sexual Activity  . Alcohol use: No  . Drug use: No  . Sexual activity: Not on file  Lifestyle  .  Physical activity:    Days per week: Not on file    Minutes per session: Not on file  . Stress: Not on file  Relationships  . Social connections:    Talks on phone: Not on file    Gets together: Not on file    Attends religious service: Not on file    Active member of club or organization: Not on file    Attends meetings of clubs or organizations: Not on file    Relationship status: Not on file  . Intimate partner violence:    Fear of current or ex partner: Not on file    Emotionally abused: Not on file    Physically abused: Not on file    Forced sexual activity: Not on file  Other Topics Concern  . Not on file  Social History Narrative  . Not on file    Outpatient Encounter Medications as of 05/03/2018  Medication Sig  . amLODipine (NORVASC) 5 MG tablet TAKE 1 TABLET BY MOUTH EVERY DAY  . amoxicillin-clavulanate (AUGMENTIN) 875-125 MG tablet Take 1 tablet by mouth 2 (two) times daily for 10 days.   No facility-administered encounter medications on file as of 05/03/2018.     Allergies  Allergen Reactions  . Bleomycin     Cramping all over  Review of Systems  Constitutional: Positive for fever. Negative for activity change, appetite change, chills, diaphoresis, fatigue and unexpected weight change.  Respiratory: Negative for cough, chest tightness, shortness of breath and wheezing.   Cardiovascular: Negative for chest pain and palpitations.  Gastrointestinal: Negative for abdominal pain, constipation, diarrhea, nausea and vomiting.  Skin: Negative for color change, rash and wound.  Neurological: Negative for dizziness, weakness, light-headedness and headaches.  Hematological: Positive for adenopathy. Does not bruise/bleed easily.  Psychiatric/Behavioral: Negative for confusion.  All other systems reviewed and are negative.       Objective:  BP (!) 159/97   Pulse 77   Temp (!) 100.4 F (38 C) (Oral)   Wt 240 lb (108.9 kg)   BMI 30.81 kg/m    Wt Readings from  Last 3 Encounters:  05/03/18 240 lb (108.9 kg)  01/12/18 236 lb 12.8 oz (107.4 kg)  07/13/17 235 lb 8 oz (106.8 kg)    Physical Exam Vitals signs and nursing note reviewed.  Constitutional:      General: He is not in acute distress.    Appearance: Normal appearance. He is well-developed and well-groomed. He is not ill-appearing or toxic-appearing.  HENT:     Head: Normocephalic and atraumatic.     Right Ear: Tympanic membrane, ear canal and external ear normal.     Left Ear: Tympanic membrane, ear canal and external ear normal.     Nose: Nose normal.     Mouth/Throat:     Mouth: Mucous membranes are moist.     Pharynx: Oropharynx is clear.  Eyes:     Conjunctiva/sclera: Conjunctivae normal.     Pupils: Pupils are equal, round, and reactive to light.  Neck:     Musculoskeletal: Normal range of motion and neck supple.  Cardiovascular:     Rate and Rhythm: Normal rate and regular rhythm.     Heart sounds: Normal heart sounds. No murmur. No friction rub. No gallop.   Pulmonary:     Effort: Pulmonary effort is normal. No respiratory distress.     Breath sounds: Normal breath sounds.  Chest:     Chest wall: No mass.     Breasts: Breasts are symmetrical.   Abdominal:     General: Bowel sounds are normal. There is no distension.     Palpations: Abdomen is soft.     Tenderness: There is no abdominal tenderness.  Musculoskeletal: Normal range of motion.  Lymphadenopathy:     Cervical: No cervical adenopathy.     Upper Body:     Right upper body: Axillary adenopathy (approximate 4-5 cm rubbery mass, no erythema or tenderness, no drainage) present. No supraclavicular, pectoral or epitrochlear adenopathy.     Left upper body: No supraclavicular, axillary, pectoral or epitrochlear adenopathy.  Skin:    General: Skin is warm and dry.     Capillary Refill: Capillary refill takes less than 2 seconds.  Neurological:     General: No focal deficit present.     Mental Status: He is alert  and oriented to person, place, and time.  Psychiatric:        Mood and Affect: Mood normal.        Behavior: Behavior normal. Behavior is cooperative.        Thought Content: Thought content normal.        Judgment: Judgment normal.     Results for orders placed or performed in visit on 01/12/18  CMP14+EGFR  Result Value Ref Range   Glucose  104 (H) 65 - 99 mg/dL   BUN 14 6 - 24 mg/dL   Creatinine, Ser 1.13 0.76 - 1.27 mg/dL   GFR calc non Af Amer 75 >59 mL/min/1.73   GFR calc Af Amer 87 >59 mL/min/1.73   BUN/Creatinine Ratio 12 9 - 20   Sodium 140 134 - 144 mmol/L   Potassium 4.7 3.5 - 5.2 mmol/L   Chloride 100 96 - 106 mmol/L   CO2 24 20 - 29 mmol/L   Calcium 10.2 8.7 - 10.2 mg/dL   Total Protein 7.0 6.0 - 8.5 g/dL   Albumin 5.0 3.5 - 5.5 g/dL   Globulin, Total 2.0 1.5 - 4.5 g/dL   Albumin/Globulin Ratio 2.5 (H) 1.2 - 2.2   Bilirubin Total 0.5 0.0 - 1.2 mg/dL   Alkaline Phosphatase 78 39 - 117 IU/L   AST 22 0 - 40 IU/L   ALT 30 0 - 44 IU/L  Lipid panel  Result Value Ref Range   Cholesterol, Total 191 100 - 199 mg/dL   Triglycerides 104 0 - 149 mg/dL   HDL 44 >39 mg/dL   VLDL Cholesterol Cal 21 5 - 40 mg/dL   LDL Calculated 126 (H) 0 - 99 mg/dL   Chol/HDL Ratio 4.3 0.0 - 5.0 ratio  Testosterone,Free and Total  Result Value Ref Range   Testosterone 373 264 - 916 ng/dL   Testosterone, Free 6.8 (L) 7.2 - 24.0 pg/mL     X-Ray: Chest: No acute findings, right axilla adenopathy. Preliminary x-ray reading by Monia Pouch, FNP-C, [facility].   Pertinent labs & imaging results that were available during my care of the patient were reviewed by me and considered in my medical decision making.  Assessment & Plan:  Bailee was seen today for knot under right arm.  Diagnoses and all orders for this visit:  H/O Hodgkin's lymphoma No obvious mass on chest xray, right axilla adenopathy, will notify if radiology reading differs. Labs pending. Chest CT ordered due to history.  -     DG  Chest 2 View; Future -     CMP14+EGFR -     CBC with Differential/Platelet -     CT Chest W Contrast; Future  Lymphadenopathy Labs pending. Will empirically treat with Augmentin. Return in one week for reevaluation. Report any new or worsening symptoms.  -     DG Chest 2 View; Future -     CMP14+EGFR -     CBC with Differential/Platelet -     amoxicillin-clavulanate (AUGMENTIN) 875-125 MG tablet; Take 1 tablet by mouth 2 (two) times daily for 10 days. -     CT Chest W Contrast; Future  Fever of unknown origin Fever control measures discussed. Report any new or worsening symptoms.  -     CMP14+EGFR -     CBC with Differential/Platelet -     amoxicillin-clavulanate (AUGMENTIN) 875-125 MG tablet; Take 1 tablet by mouth 2 (two) times daily for 10 days. -     CT Chest W Contrast; Future     Continue all other maintenance medications.  Follow up plan: Return in about 1 week (around 05/10/2018), or if symptoms worsen or fail to improve, for axilla lymphadenopathy.  Educational handout given for lymphadenopathy   The above assessment and management plan was discussed with the patient. The patient verbalized understanding of and has agreed to the management plan. Patient is aware to call the clinic if symptoms persist or worsen. Patient is aware when to return to the  clinic for a follow-up visit. Patient educated on when it is appropriate to go to the emergency department.   Monia Pouch, FNP-C [facility] [phone]

## 2018-05-03 NOTE — Patient Instructions (Signed)
Lymphadenopathy    Lymphadenopathy means that your lymph glands are swollen or larger than normal (enlarged). Lymph glands, also called lymph nodes, are collections of tissue that filter bacteria, viruses, and waste from your bloodstream. They are part of your body's disease-fighting system (immune system), which protects your body from germs.  There may be different causes of lymphadenopathy, depending on where it is in your body. Some types go away on their own. Lymphadenopathy can occur anywhere that you have lymph glands, including these areas:  · Neck (cervical lymphadenopathy).  · Chest (mediastinal lymphadenopathy).  · Lungs (hilar lymphadenopathy).  · Underarms (axillary lymphadenopathy).  · Groin (inguinal lymphadenopathy).  When your immune system responds to germs, infection-fighting cells and fluid build up in your lymph glands. This causes some swelling and enlargement. If the lymph glands do not go back to normal after you have an infection or disease, your health care provider may do tests. These tests help to monitor your condition and find the reason why the glands are still swollen and enlarged.  Follow these instructions at home:  · Get plenty of rest.  · Take over-the-counter and prescription medicines only as told by your health care provider. Your health care provider may recommend over-the-counter medicines for pain.  · If directed, apply heat to swollen lymph glands as often as told by your health care provider. Use the heat source that your health care provider recommends, such as a moist heat pack or a heating pad.  ? Place a towel between your skin and the heat source.  ? Leave the heat on for 20-30 minutes.  ? Remove the heat if your skin turns bright red. This is especially important if you are unable to feel pain, heat, or cold. You may have a greater risk of getting burned.  · Check your affected lymph glands every day for changes. Check other lymph gland areas as told by your health  care provider. Check for changes such as:  ? More swelling.  ? Sudden increase in size.  ? Redness or pain.  ? Hardness.  · Keep all follow-up visits as told by your health care provider. This is important.  Contact a health care provider if you have:  · Swelling that gets worse or spreads to other areas.  · Problems with breathing.  · Lymph glands that:  ? Are still swollen after 2 weeks.  ? Have suddenly gotten bigger.  ? Are red, painful, or hard.  · A fever or chills.  · Fatigue.  · A sore throat.  · Pain in your abdomen.  · Weight loss.  · Night sweats.  Get help right away if you have:  · Fluid leaking from an enlarged lymph gland.  · Severe pain.  · Chest pain.  · Shortness of breath.  Summary  · Lymphadenopathy means that your lymph glands are swollen or larger than normal (enlarged).  · Lymph glands (also called lymph nodes) are collections of tissue that filter bacteria, viruses, and waste from the bloodstream. They are part of your body's disease-fighting system (immune system).  · Lymphadenopathy can occur anywhere that you have lymph glands.  · If your enlarged and swollen lymph glands do not go back to normal after you have an infection or disease, your health care provider may do tests to monitor your condition and find the reason why the glands are still swollen and enlarged.  · Check your affected lymph glands every day for changes. Check other lymph   gland areas as told by your health care provider.  This information is not intended to replace advice given to you by your health care provider. Make sure you discuss any questions you have with your health care provider.  Document Released: 10/30/2007 Document Revised: 12/05/2016 Document Reviewed: 12/05/2016  [employer]vier Interactive Patient Education © 2019 [employer]

## 2018-05-04 ENCOUNTER — Ambulatory Visit (HOSPITAL_COMMUNITY)
Admission: RE | Admit: 2018-05-04 | Discharge: 2018-05-04 | Disposition: A | Discharge status: Home / Self Care | Payer: BLUE CROSS/BLUE SHIELD | Source: Ambulatory Visit | Attending: Family Medicine | Admitting: Family Medicine

## 2018-05-04 ENCOUNTER — Other Ambulatory Visit: Payer: Self-pay

## 2018-05-04 ENCOUNTER — Other Ambulatory Visit: Payer: Self-pay | Admitting: Family Medicine

## 2018-05-04 DIAGNOSIS — C8194 Hodgkin lymphoma, unspecified, lymph nodes of axilla and upper limb: Secondary | ICD-10-CM | POA: Diagnosis not present

## 2018-05-04 DIAGNOSIS — R591 Generalized enlarged lymph nodes: Secondary | ICD-10-CM

## 2018-05-04 DIAGNOSIS — R509 Fever, unspecified: Secondary | ICD-10-CM

## 2018-05-04 DIAGNOSIS — L049 Acute lymphadenitis, unspecified: Secondary | ICD-10-CM

## 2018-05-04 DIAGNOSIS — Z8571 Personal history of Hodgkin lymphoma: Secondary | ICD-10-CM

## 2018-05-04 LAB — CBC WITH DIFFERENTIAL/PLATELET
Basophils Absolute: 0.1 10*3/uL (ref 0.0–0.2)
Basos: 1 %
EOS (ABSOLUTE): 0.2 10*3/uL (ref 0.0–0.4)
EOS: 3 %
Hematocrit: 49.8 % (ref 37.5–51.0)
Hemoglobin: 16.1 g/dL (ref 13.0–17.7)
Immature Grans (Abs): 0 10*3/uL (ref 0.0–0.1)
Immature Granulocytes: 0 %
Lymphocytes Absolute: 4.5 10*3/uL — ABNORMAL HIGH (ref 0.7–3.1)
Lymphs: 61 %
MCH: 26.4 pg — ABNORMAL LOW (ref 26.6–33.0)
MCHC: 32.3 g/dL (ref 31.5–35.7)
MCV: 82 fL (ref 79–97)
Monocytes Absolute: 0.9 10*3/uL (ref 0.1–0.9)
Monocytes: 12 %
NEUTROS PCT: 23 %
Neutrophils Absolute: 1.7 10*3/uL (ref 1.4–7.0)
Platelets: 280 10*3/uL (ref 150–450)
RBC: 6.09 x10E6/uL — ABNORMAL HIGH (ref 4.14–5.80)
RDW: 14.4 % (ref 11.6–15.4)
WBC: 7.3 10*3/uL (ref 3.4–10.8)

## 2018-05-04 LAB — CMP14+EGFR
ALT: 25 IU/L (ref 0–44)
AST: 18 IU/L (ref 0–40)
Albumin/Globulin Ratio: 2.1 (ref 1.2–2.2)
Albumin: 5 g/dL (ref 4.0–5.0)
Alkaline Phosphatase: 78 IU/L (ref 39–117)
BUN/Creatinine Ratio: 14 (ref 9–20)
BUN: 16 mg/dL (ref 6–24)
Bilirubin Total: 0.4 mg/dL (ref 0.0–1.2)
CO2: 23 mmol/L (ref 20–29)
CREATININE: 1.18 mg/dL (ref 0.76–1.27)
Calcium: 10.1 mg/dL (ref 8.7–10.2)
Chloride: 101 mmol/L (ref 96–106)
GFR calc Af Amer: 83 mL/min/{1.73_m2} (ref >59)
GFR calc non Af Amer: 72 mL/min/{1.73_m2} (ref >59)
Globulin, Total: 2.4 g/dL (ref 1.5–4.5)
Glucose: 97 mg/dL (ref 65–99)
Potassium: 3.8 mmol/L (ref 3.5–5.2)
Sodium: 144 mmol/L (ref 134–144)
Total Protein: 7.4 g/dL (ref 6.0–8.5)

## 2018-05-04 MED ORDER — IOHEXOL 300 MG/ML  SOLN
75.0000 mL | Freq: Once | INTRAMUSCULAR | Status: AC | PRN
  Administered 2018-05-04: 75 mL via INTRAVENOUS

## 2018-05-06 ENCOUNTER — Other Ambulatory Visit: Payer: Self-pay

## 2018-05-06 ENCOUNTER — Inpatient Hospital Stay (HOSPITAL_COMMUNITY): Payer: BLUE CROSS/BLUE SHIELD | Attending: Hematology | Admitting: Hematology

## 2018-05-06 ENCOUNTER — Inpatient Hospital Stay (HOSPITAL_COMMUNITY): Payer: BLUE CROSS/BLUE SHIELD

## 2018-05-06 ENCOUNTER — Encounter (HOSPITAL_COMMUNITY)
Admission: RE | Admit: 2018-05-06 | Discharge: 2018-05-06 | Disposition: A | Discharge status: Home / Self Care | Payer: BLUE CROSS/BLUE SHIELD | Source: Ambulatory Visit | Attending: General Surgery | Admitting: General Surgery

## 2018-05-06 ENCOUNTER — Encounter (HOSPITAL_COMMUNITY): Payer: Self-pay | Admitting: Hematology

## 2018-05-06 VITALS — BP 153/109 | HR 85 | Temp 98.4°F | Resp 18 | Wt 235.7 lb

## 2018-05-06 DIAGNOSIS — Z5111 Encounter for antineoplastic chemotherapy: Secondary | ICD-10-CM | POA: Insufficient documentation

## 2018-05-06 DIAGNOSIS — Z8571 Personal history of Hodgkin lymphoma: Secondary | ICD-10-CM | POA: Diagnosis not present

## 2018-05-06 DIAGNOSIS — C8104 Nodular lymphocyte predominant Hodgkin lymphoma, lymph nodes of axilla and upper limb: Secondary | ICD-10-CM | POA: Insufficient documentation

## 2018-05-06 DIAGNOSIS — Z5112 Encounter for antineoplastic immunotherapy: Secondary | ICD-10-CM | POA: Insufficient documentation

## 2018-05-06 DIAGNOSIS — Z5189 Encounter for other specified aftercare: Secondary | ICD-10-CM | POA: Insufficient documentation

## 2018-05-06 DIAGNOSIS — R590 Localized enlarged lymph nodes: Secondary | ICD-10-CM | POA: Diagnosis not present

## 2018-05-06 DIAGNOSIS — Z23 Encounter for immunization: Secondary | ICD-10-CM | POA: Diagnosis not present

## 2018-05-06 DIAGNOSIS — R591 Generalized enlarged lymph nodes: Secondary | ICD-10-CM | POA: Insufficient documentation

## 2018-05-06 LAB — HEPATIC FUNCTION PANEL
ALT: 32 U/L (ref 0–44)
AST: 24 U/L (ref 15–41)
Albumin: 5 g/dL (ref 3.5–5.0)
Alkaline Phosphatase: 70 U/L (ref 38–126)
Bilirubin, Direct: 0.1 mg/dL (ref 0.0–0.2)
Indirect Bilirubin: 0.8 mg/dL (ref 0.3–0.9)
Total Bilirubin: 0.9 mg/dL (ref 0.3–1.2)
Total Protein: 8.2 g/dL — ABNORMAL HIGH (ref 6.5–8.1)

## 2018-05-06 LAB — URIC ACID: Uric Acid, Serum: 6.2 mg/dL (ref 3.7–8.6)

## 2018-05-06 LAB — LACTATE DEHYDROGENASE: LDH: 188 U/L (ref 98–192)

## 2018-05-06 NOTE — Progress Notes (Signed)
[facility] NOTE  Patient Care Team: [name], MD as PCP - General (Family Medicine)  CHIEF COMPLAINTS/PURPOSE OF CONSULTATION:  Right axillary adenopathy.  HISTORY OF PRESENTING ILLNESS:  Devin Tran 53 y.o. male is seen in consultation today for further work-up and management of right axillary adenopathy.  He noticed a lump under his right axilla about 2 months ago.  He also noticed night sweats for the same duration.  Denies any fevers or weight loss.  He is continuing to work full-time job.  He lives at home with his girlfriend.  He had a history of Hodgkin's disease in the neck region diagnosed in 76 at age 18.  He was treated in [place] with chemotherapy.  He reportedly had a recurrence after 1 to 2 years and was again treated with chemotherapy 1991.  He also had splenectomy at that time.  Does not report any continuing vaccination.  He also plays basketball and does not report any severe tiredness. Mother had breast cancer and father had lung cancer.  Father was a smoker.  MEDICAL HISTORY:  Past Medical History:  Diagnosis Date  . Erectile dysfunction   . Hodgkin's lymphoma ([place])   . Hyperlipidemia   . Hypertension   . Infertility   . Low testosterone     SURGICAL HISTORY: Past Surgical History:  Procedure Laterality Date  . SPLENECTOMY Left Over 20 years ago   Due to Hodgkin's lymphoma    SOCIAL HISTORY: Social History   Socioeconomic History  . Marital status: Single    Spouse name: Not on file  . Number of children: Not on file  . Years of education: Not on file  . Highest education level: Not on file  Occupational History  . Occupation: Drive Chief Operating Officer: [employer]  . Financial resource strain: Not on file  . Food insecurity:    Worry: Not on file    Inability: Not on file  . Transportation needs:    Medical: Not on file    Non-medical: Not on file  Tobacco Use  . Smoking status: Never Smoker   . Smokeless tobacco: Never Used  Substance and Sexual Activity  . Alcohol use: No  . Drug use: No  . Sexual activity: Not on file  Lifestyle  . Physical activity:    Days per week: Not on file    Minutes per session: Not on file  . Stress: Not on file  Relationships  . Social connections:    Talks on phone: Not on file    Gets together: Not on file    Attends religious service: Not on file    Active member of club or organization: Not on file    Attends meetings of clubs or organizations: Not on file    Relationship status: Not on file  . Intimate partner violence:    Fear of current or ex partner: Not on file    Emotionally abused: Not on file    Physically abused: Not on file    Forced sexual activity: Not on file  Other Topics Concern  . Not on file  Social History Narrative  . Not on file    FAMILY HISTORY: Family History  Problem Relation Age of Onset  . Hypertension Mother   . Cancer Father        lung  . Hypertension Father   . Diabetes Father   . COPD Father   . Hypertension Brother   .  Stroke Brother     ALLERGIES:  is allergic to bleomycin.  MEDICATIONS:  Current Outpatient Medications  Medication Sig Dispense Refill  . amLODipine (NORVASC) 5 MG tablet TAKE 1 TABLET BY MOUTH EVERY DAY 90 tablet 1  . amoxicillin-clavulanate (AUGMENTIN) 875-125 MG tablet Take 1 tablet by mouth 2 (two) times daily for 10 days. 20 tablet 0  . aspirin EC 81 MG tablet Take 81 mg by mouth daily. Per pt    . Multiple Vitamins-Minerals (ONE-A-DAY MENS HEALTH FORMULA PO) Take by mouth daily. Per pt, pt not sure of dose     No current facility-administered medications for this visit.     REVIEW OF SYSTEMS:   Constitutional: Denies fevers, chills or abnormal night sweats Eyes: Denies blurriness of vision, double vision or watery eyes Ears, nose, mouth, throat, and face: Denies mucositis or sore throat Respiratory: Denies cough, dyspnea or wheezes Cardiovascular: Denies  palpitation, chest discomfort or lower extremity swelling Gastrointestinal:  Denies nausea, heartburn or change in bowel habits Skin: Denies abnormal skin rashes Lymphatics: Denies new lymphadenopathy or easy bruising Neurological:Denies numbness, tingling or new weaknesses Behavioral/Psych: Mood is stable, no new changes  All other systems were reviewed with the patient and are negative.  PHYSICAL EXAMINATION: ECOG PERFORMANCE STATUS: 0 - Asymptomatic  Vitals:   05/06/18 1310  BP: (!) 153/109  Pulse: 85  Resp: 18  Temp: 98.4 F (36.9 C)  SpO2: 99%   Filed Weights   05/06/18 1310  Weight: 235 lb 11.2 oz (106.9 kg)    GENERAL:alert, no distress and comfortable SKIN: skin color, texture, turgor are normal, no rashes or significant lesions EYES: normal, conjunctiva are pink and non-injected, sclera clear OROPHARYNX:no exudate, no erythema and lips, buccal mucosa, and tongue normal  NECK: supple, thyroid normal size, non-tender, without nodularity LYMPH: Right axillary adenopathy palpable.  Right supraclavicular lymph node is also palpable.  No inguinal adenopathy palpable. LUNGS: clear to auscultation and percussion with normal breathing effort HEART: regular rate & rhythm and no murmurs and no lower extremity edema ABDOMEN:abdomen soft, non-tender and normal bowel sounds Musculoskeletal:no cyanosis of digits and no clubbing  PSYCH: alert & oriented x 3 with fluent speech NEURO: no focal motor/sensory deficits  LABORATORY DATA:  I have reviewed the data as listed Lab Results  Component Value Date   WBC 7.3 05/03/2018   HGB 16.1 05/03/2018   HCT 49.8 05/03/2018   MCV 82 05/03/2018   PLT 280 05/03/2018     Chemistry      Component Value Date/Time   NA 144 05/03/2018 1553   K 3.8 05/03/2018 1553   CL 101 05/03/2018 1553   CO2 23 05/03/2018 1553   BUN 16 05/03/2018 1553   CREATININE 1.18 05/03/2018 1553      Component Value Date/Time   CALCIUM 10.1 05/03/2018 1553    ALKPHOS 70 05/06/2018 1420   AST 24 05/06/2018 1420   ALT 32 05/06/2018 1420   BILITOT 0.9 05/06/2018 1420   BILITOT 0.4 05/03/2018 1553       RADIOGRAPHIC STUDIES: I have personally reviewed the radiological images as listed and agreed with the findings in the report. Dg Chest 2 View  Result Date: 05/03/2018 CLINICAL DATA:  History of Hodgkin's lymphoma right axillary adenopathy EXAM: CHEST - 2 VIEW COMPARISON:  None. FINDINGS: Normal heart size and vascularity. Lungs remain clear. No focal pneumonia, collapse or consolidation. Negative for edema, effusion or pneumothorax. Trachea midline. Postop changes in the left supraclavicular region and in the  epigastric region. Degenerative changes of the spine. Asymmetric soft tissue nodularity in the right axillary region suggest bulky right axillary adenopathy. IMPRESSION: No acute chest process.  See above comment. Electronically Signed   By: Jerilynn Mages.  Shick M.D.   On: 05/03/2018 15:49   Ct Chest W Contrast  Result Date: 05/04/2018 CLINICAL DATA:  Enlarging right axillary not. History of Hodgkin's lymphoma. EXAM: CT CHEST WITH CONTRAST TECHNIQUE: Multidetector CT imaging of the chest was performed during intravenous contrast administration. CONTRAST:  44mL OMNIPAQUE IOHEXOL 300 MG/ML  SOLN COMPARISON:  Chest x-ray dated May 03, 2018. FINDINGS: Cardiovascular: Normal heart size. No pericardial effusion. No thoracic aortic aneurysm. Mediastinum/Nodes: There are multiple enlarged right axillary lymph nodes measuring up to 3.2 cm in short axis. There are several enlarged left axillary lymph nodes measuring up to 2.8 cm in short axis. No mediastinal or hilar lymphadenopathy. The thyroid gland, trachea, and esophagus demonstrate no significant findings. Lungs/Pleura: No focal consolidation, pleural effusion, or pneumothorax. 5 mm nodule in the right middle lobe (series 4, image 113). Upper Abdomen: No acute abnormality. 1.6 cm enhancing lesion in the peripheral  right hepatic lobe likely represents a hemangioma. Prior splenectomy. Musculoskeletal: Bilateral gynecomastia. No acute or significant osseous findings. IMPRESSION: 1. Bulky right greater than left axillary lymphadenopathy, consistent with recurrent lymphoma. 2. No mediastinal or hilar lymphadenopathy. 3. 5 mm nodule in the right middle lobe. Comparison with any prior outside imaging is recommended to confirm stability. If none available, no follow-up is needed if patient is low-risk. Non-contrast chest CT can be considered in 12 months if patient is high-risk. This recommendation follows the consensus statement: Guidelines for Management of Incidental Pulmonary Nodules Detected on CT Images: From the Fleischner Society 2017; Radiology 2017; 284:228-243. These results will be called to the ordering clinician or representative by the Radiologist Assistant, and communication documented in the PACS or zVision Dashboard. Electronically Signed   By: Titus Dubin M.D.   On: 05/04/2018 11:15    ASSESSMENT & PLAN:  Lymphadenopathy 1.  Right axillary lymphadenopathy: -Patient reports history of Hodgkin's disease in the neck region initially diagnosed in 42 at age 10 and underwent chemotherapy. -He reportedly had recurrence and was treated again in 1991.  He was treated by Dr. Sonny Dandy in [place].  He also had a splenectomy at that time. -She reported right axillary lump for the last 2 months.  He also reports night sweats for the last 2 months.  Denies any fevers or weight loss. -He is continuing to work and also plays basketball.  He lives in [place] with his girlfriend. - CT of the chest on 05/04/2018 shows 3.2 cm right axillary lymph node along with multiple small lymph nodes.  Several left axillary lymph node measuring up to 2.8 cm.  No mediastinal or hilar adenopathy.  There is supraclavicular adenopathy. - Family history significant for mother with breast cancer and father with lung cancer who was a  smoker. - I have reviewed CT scan images with the patient.  We will check his LDH, beta-2 microglobulin, uric acid and hepatitis panel. -I have recommended a staging PET CT scan. -I have also recommended excision biopsy of the right axillary lymph node with flow cytometry.  We will make a referral to Dr. Arnoldo Morale.  2.  Postsplenectomy state: -He does not report any recent vaccination other than flu shot. -We will make sure that he gets meningococcal and pneumococcal vaccines.  Orders Placed This Encounter  Procedures  . NM PET Image Initial (  PI) Skull Base To Thigh    Standing Status:   Future    Standing Expiration Date:   11/05/2018    Order Specific Question:   ** REASON FOR EXAM (FREE TEXT)    Answer:   history of hodgkin's lymphoma, abnormal CT scan    Order Specific Question:   If indicated for the ordered procedure, I authorize the administration of a radiopharmaceutical per Radiology protocol    Answer:   Yes    Order Specific Question:   Preferred imaging location?    Answer:   [facility]    Order Specific Question:   Radiology Contrast Protocol - do NOT remove file path    Answer:   [email or web address]  . Lactate dehydrogenase    Standing Status:   Future    Number of Occurrences:   1    Standing Expiration Date:   05/06/2019  . Uric acid    Standing Status:   Future    Number of Occurrences:   1    Standing Expiration Date:   05/06/2019  . Hepatic function panel    Standing Status:   Future    Number of Occurrences:   1    Standing Expiration Date:   05/06/2019  . Beta 2 microglobuline, serum    Standing Status:   Future    Number of Occurrences:   1    Standing Expiration Date:   05/06/2019    All questions were answered. The patient knows to call the clinic with any problems, questions or concerns.      Derek Jack, MD 05/06/2018 4:32 PM

## 2018-05-06 NOTE — Patient Instructions (Addendum)
[place] at [facility] Discharge Instructions  You were seen today by Dr. Derek Jack.  We are sending you to Dr. Arnoldo Morale for a biopsy of the area under your arm and potentially the supraclavicular area.  We will be able to determine at that time what we are dealing with.  We will also send you for a PET scan that will show [place] the rest of your body that didn't get scanned when you had the CT scan.  We will be able to tell if it has spread outside of the area already seen.  We will do lab work today. We will discuss all these results with you once you return.       Thank you for choosing [place] at [facility] to provide your oncology and hematology care.  To afford each patient quality time with our provider, please arrive at least 15 minutes before your scheduled appointment time.   If you have a lab appointment with the [facility] please come in thru the  Main Entrance and check in at the main information desk  You need to re-schedule your appointment should you arrive 10 or more minutes late.  We strive to give you quality time with our providers, and arriving late affects you and other patients whose appointments are after yours.  Also, if you no show three or more times for appointments you may be dismissed from the clinic at the providers discretion.     Again, thank you for choosing [facility].  Our hope is that these requests will decrease the amount of time that you wait before being seen by our physicians.       _____________________________________________________________  Should you have questions after your visit to [facility], please contact our office at [phone] between the hours of 8:00 a.m. and 4:30 p.m.  Voicemails left after 4:00 p.m. will not be returned until the following business day.  For prescription refill requests, have your pharmacy contact our office and allow 72 hours.     Cancer Center Support Programs:   > Cancer Support Group  2nd Tuesday of the month 1pm-2pm, Journey Room

## 2018-05-06 NOTE — Assessment & Plan Note (Addendum)
1.  Right axillary lymphadenopathy: -Patient reports history of Hodgkin's disease in the neck region initially diagnosed in 75 at age 53 and underwent chemotherapy. -He reportedly had recurrence and was treated again in 1991.  He was treated by Dr. Sonny Dandy in [place].  He also had a splenectomy at that time. -She reported right axillary lump for the last 2 months.  He also reports night sweats for the last 2 months.  Denies any fevers or weight loss. -He is continuing to work and also plays basketball.  He lives in [place] with his girlfriend. - CT of the chest on 05/04/2018 shows 3.2 cm right axillary lymph node along with multiple small lymph nodes.  Several left axillary lymph node measuring up to 2.8 cm.  No mediastinal or hilar adenopathy.  There is supraclavicular adenopathy. - Family history significant for mother with breast cancer and father with lung cancer who was a smoker. - I have reviewed CT scan images with the patient.  We will check his LDH, beta-2 microglobulin, uric acid and hepatitis panel. -I have recommended a staging PET CT scan. -I have also recommended excision biopsy of the right axillary lymph node with flow cytometry.  We will make a referral to Dr. Arnoldo Morale.  2.  Postsplenectomy state: -He does not report any recent vaccination other than flu shot. -We will make sure that he gets meningococcal and pneumococcal vaccines.

## 2018-05-07 ENCOUNTER — Encounter (HOSPITAL_COMMUNITY): Payer: Self-pay | Admitting: *Deleted

## 2018-05-07 ENCOUNTER — Encounter (HOSPITAL_COMMUNITY): Payer: Self-pay

## 2018-05-07 ENCOUNTER — Other Ambulatory Visit: Payer: Self-pay

## 2018-05-07 LAB — BETA 2 MICROGLOBULIN, SERUM: Beta-2 Microglobulin: 1.3 mg/L (ref 0.6–2.4)

## 2018-05-10 ENCOUNTER — Encounter (HOSPITAL_COMMUNITY): Payer: Self-pay | Admitting: *Deleted

## 2018-05-10 ENCOUNTER — Encounter (HOSPITAL_COMMUNITY)
Admission: RE | Disposition: A | Discharge status: Home / Self Care | Payer: Self-pay | Source: Home / Self Care | Attending: General Surgery

## 2018-05-10 ENCOUNTER — Ambulatory Visit (HOSPITAL_COMMUNITY): Payer: BLUE CROSS/BLUE SHIELD | Admitting: Anesthesiology

## 2018-05-10 ENCOUNTER — Ambulatory Visit (HOSPITAL_COMMUNITY)
Admission: RE | Admit: 2018-05-10 | Discharge: 2018-05-10 | Disposition: A | Discharge status: Home / Self Care | Payer: BLUE CROSS/BLUE SHIELD | Attending: Hematology | Admitting: Hematology

## 2018-05-10 DIAGNOSIS — R591 Generalized enlarged lymph nodes: Secondary | ICD-10-CM

## 2018-05-10 DIAGNOSIS — Z825 Family history of asthma and other chronic lower respiratory diseases: Secondary | ICD-10-CM | POA: Insufficient documentation

## 2018-05-10 DIAGNOSIS — E785 Hyperlipidemia, unspecified: Secondary | ICD-10-CM | POA: Diagnosis not present

## 2018-05-10 DIAGNOSIS — Z8571 Personal history of Hodgkin lymphoma: Secondary | ICD-10-CM | POA: Diagnosis not present

## 2018-05-10 DIAGNOSIS — C8514 Unspecified B-cell lymphoma, lymph nodes of axilla and upper limb: Secondary | ICD-10-CM | POA: Insufficient documentation

## 2018-05-10 DIAGNOSIS — Z823 Family history of stroke: Secondary | ICD-10-CM | POA: Diagnosis not present

## 2018-05-10 DIAGNOSIS — I10 Essential (primary) hypertension: Secondary | ICD-10-CM | POA: Insufficient documentation

## 2018-05-10 DIAGNOSIS — Z833 Family history of diabetes mellitus: Secondary | ICD-10-CM | POA: Diagnosis not present

## 2018-05-10 DIAGNOSIS — Z881 Allergy status to other antibiotic agents status: Secondary | ICD-10-CM | POA: Insufficient documentation

## 2018-05-10 DIAGNOSIS — Z7982 Long term (current) use of aspirin: Secondary | ICD-10-CM | POA: Diagnosis not present

## 2018-05-10 DIAGNOSIS — Z801 Family history of malignant neoplasm of trachea, bronchus and lung: Secondary | ICD-10-CM | POA: Insufficient documentation

## 2018-05-10 DIAGNOSIS — Z79899 Other long term (current) drug therapy: Secondary | ICD-10-CM | POA: Diagnosis not present

## 2018-05-10 DIAGNOSIS — C8590 Non-Hodgkin lymphoma, unspecified, unspecified site: Secondary | ICD-10-CM | POA: Diagnosis not present

## 2018-05-10 DIAGNOSIS — R590 Localized enlarged lymph nodes: Secondary | ICD-10-CM | POA: Diagnosis not present

## 2018-05-10 DIAGNOSIS — Z8249 Family history of ischemic heart disease and other diseases of the circulatory system: Secondary | ICD-10-CM | POA: Insufficient documentation

## 2018-05-10 DIAGNOSIS — Z9081 Acquired absence of spleen: Secondary | ICD-10-CM | POA: Diagnosis not present

## 2018-05-10 HISTORY — DX: Other specified postprocedural states: Z98.890

## 2018-05-10 HISTORY — PX: AXILLARY LYMPH NODE BIOPSY: SHX5737

## 2018-05-10 HISTORY — DX: Other complications of anesthesia, initial encounter: T88.59XA

## 2018-05-10 HISTORY — DX: Nausea with vomiting, unspecified: R11.2

## 2018-05-10 HISTORY — DX: Adverse effect of unspecified anesthetic, initial encounter: T41.45XA

## 2018-05-10 SURGERY — AXILLARY LYMPH NODE BIOPSY
Anesthesia: Monitor Anesthesia Care | Site: Axilla | Laterality: Right

## 2018-05-10 MED ORDER — PROPOFOL 10 MG/ML IV BOLUS
INTRAVENOUS | Status: AC
  Filled 2018-05-10: qty 20

## 2018-05-10 MED ORDER — PROPOFOL 10 MG/ML IV BOLUS
INTRAVENOUS | Status: DC | PRN
  Administered 2018-05-10: 200 mg via INTRAVENOUS

## 2018-05-10 MED ORDER — FENTANYL CITRATE (PF) 100 MCG/2ML IJ SOLN
INTRAMUSCULAR | Status: AC
  Filled 2018-05-10: qty 4

## 2018-05-10 MED ORDER — ROCURONIUM BROMIDE 100 MG/10ML IV SOLN
INTRAVENOUS | Status: DC | PRN
  Administered 2018-05-10: 10 mg via INTRAVENOUS

## 2018-05-10 MED ORDER — MIDAZOLAM HCL 2 MG/2ML IJ SOLN
INTRAMUSCULAR | Status: AC
  Filled 2018-05-10: qty 2

## 2018-05-10 MED ORDER — HYDROCODONE-ACETAMINOPHEN 7.5-325 MG PO TABS: 1.0000 | ORAL_TABLET | Freq: Once | ORAL | Status: DC | PRN

## 2018-05-10 MED ORDER — ONDANSETRON HCL 4 MG/2ML IJ SOLN
INTRAMUSCULAR | Status: AC
  Filled 2018-05-10: qty 2

## 2018-05-10 MED ORDER — MIDAZOLAM HCL 5 MG/5ML IJ SOLN
INTRAMUSCULAR | Status: DC | PRN
  Administered 2018-05-10: 2 mg via INTRAVENOUS

## 2018-05-10 MED ORDER — ONDANSETRON HCL 4 MG/2ML IJ SOLN
INTRAMUSCULAR | Status: DC | PRN
  Administered 2018-05-10: 4 mg via INTRAVENOUS

## 2018-05-10 MED ORDER — LACTATED RINGERS IV SOLN
INTRAVENOUS | Status: DC
  Administered 2018-05-10: 09:00:00 via INTRAVENOUS

## 2018-05-10 MED ORDER — SUCCINYLCHOLINE CHLORIDE 20 MG/ML IJ SOLN
INTRAMUSCULAR | Status: DC | PRN
  Administered 2018-05-10: 180 mg via INTRAVENOUS

## 2018-05-10 MED ORDER — 0.9 % SODIUM CHLORIDE (POUR BTL) OPTIME
TOPICAL | Status: DC | PRN
  Administered 2018-05-10: 1000 mL

## 2018-05-10 MED ORDER — HYDROMORPHONE HCL 1 MG/ML IJ SOLN: 0.2500 mg | INTRAMUSCULAR | Status: DC | PRN

## 2018-05-10 MED ORDER — ROCURONIUM BROMIDE 10 MG/ML (PF) SYRINGE
PREFILLED_SYRINGE | INTRAVENOUS | Status: AC
  Filled 2018-05-10: qty 10

## 2018-05-10 MED ORDER — BUPIVACAINE HCL (PF) 0.5 % IJ SOLN
INTRAMUSCULAR | Status: DC | PRN
  Administered 2018-05-10: 8 mL

## 2018-05-10 MED ORDER — CHLORHEXIDINE GLUCONATE CLOTH 2 % EX PADS: 6.0000 | MEDICATED_PAD | Freq: Once | CUTANEOUS | Status: DC

## 2018-05-10 MED ORDER — SUCCINYLCHOLINE CHLORIDE 200 MG/10ML IV SOSY
PREFILLED_SYRINGE | INTRAVENOUS | Status: AC
  Filled 2018-05-10: qty 10

## 2018-05-10 MED ORDER — BUPIVACAINE HCL (PF) 0.5 % IJ SOLN
INTRAMUSCULAR | Status: AC
  Filled 2018-05-10: qty 30

## 2018-05-10 MED ORDER — HYDROCODONE-ACETAMINOPHEN 5-325 MG PO TABS: 1.0000 | ORAL_TABLET | ORAL | 0 refills | Status: DC | PRN

## 2018-05-10 MED ORDER — LIDOCAINE HCL 1 % IJ SOLN
INTRAMUSCULAR | Status: DC | PRN
  Administered 2018-05-10: 50 mg via INTRADERMAL

## 2018-05-10 MED ORDER — KETOROLAC TROMETHAMINE 30 MG/ML IJ SOLN: 30.0000 mg | Freq: Once | INTRAMUSCULAR | Status: DC

## 2018-05-10 MED ORDER — MIDAZOLAM HCL 2 MG/2ML IJ SOLN: 0.5000 mg | Freq: Once | INTRAMUSCULAR | Status: DC | PRN

## 2018-05-10 MED ORDER — FENTANYL CITRATE (PF) 100 MCG/2ML IJ SOLN
INTRAMUSCULAR | Status: DC | PRN
  Administered 2018-05-10: 100 ug via INTRAVENOUS

## 2018-05-10 MED ORDER — PROMETHAZINE HCL 25 MG/ML IJ SOLN: 6.2500 mg | INTRAMUSCULAR | Status: DC | PRN

## 2018-05-10 SURGICAL SUPPLY — 34 items
APPLIER CLIP 11 MED OPEN (CLIP) ×2
BLADE SURG 15 STRL LF DISP TIS (BLADE) ×1 IMPLANT
BLADE SURG 15 STRL SS (BLADE) ×1
CHLORAPREP W/TINT 26 (MISCELLANEOUS) ×2 IMPLANT
CLIP APPLIE 11 MED OPEN (CLIP) ×1 IMPLANT
CLOTH BEACON ORANGE TIMEOUT ST (SAFETY) ×2 IMPLANT
CONT SPEC 4OZ CLIKSEAL STRL BL (MISCELLANEOUS) ×2 IMPLANT
COVER LIGHT HANDLE STERIS (MISCELLANEOUS) ×4 IMPLANT
COVER WAND RF STERILE (DRAPES) ×2 IMPLANT
DECANTER SPIKE VIAL GLASS SM (MISCELLANEOUS) ×2 IMPLANT
DERMABOND ADVANCED (GAUZE/BANDAGES/DRESSINGS) ×1
DERMABOND ADVANCED .7 DNX12 (GAUZE/BANDAGES/DRESSINGS) ×1 IMPLANT
ELECT REM PT RETURN 9FT ADLT (ELECTROSURGICAL) ×2
ELECTRODE REM PT RTRN 9FT ADLT (ELECTROSURGICAL) ×1 IMPLANT
GLOVE BIOGEL M 7.0 STRL (GLOVE) ×2 IMPLANT
GLOVE BIOGEL PI IND STRL 7.0 (GLOVE) ×1 IMPLANT
GLOVE BIOGEL PI INDICATOR 7.0 (GLOVE) ×1
GLOVE ECLIPSE 6.5 STRL STRAW (GLOVE) ×2 IMPLANT
GLOVE SURG SS PI 7.5 STRL IVOR (GLOVE) ×2 IMPLANT
GOWN STRL REUS W/TWL LRG LVL3 (GOWN DISPOSABLE) ×4 IMPLANT
INST SET MINOR GENERAL (KITS) ×2 IMPLANT
KIT TURNOVER KIT A (KITS) ×2 IMPLANT
MANIFOLD NEPTUNE II (INSTRUMENTS) ×2 IMPLANT
NEEDLE HYPO 25X1 1.5 SAFETY (NEEDLE) ×2 IMPLANT
NS IRRIG 1000ML POUR BTL (IV SOLUTION) ×2 IMPLANT
PACK MINOR (CUSTOM PROCEDURE TRAY) ×2 IMPLANT
PAD ARMBOARD 7.5X6 YLW CONV (MISCELLANEOUS) ×2 IMPLANT
SET BASIN LINEN APH (SET/KITS/TRAYS/PACK) ×2 IMPLANT
SPONGE INTESTINAL PEANUT (DISPOSABLE) IMPLANT
SPONGE LAP 18X18 RF (DISPOSABLE) ×2 IMPLANT
SUT MNCRL AB 4-0 PS2 18 (SUTURE) ×2 IMPLANT
SUT VIC AB 3-0 SH 27 (SUTURE) ×1
SUT VIC AB 3-0 SH 27X BRD (SUTURE) ×1 IMPLANT
SYR CONTROL 10ML LL (SYRINGE) ×2 IMPLANT

## 2018-05-10 NOTE — Op Note (Signed)
Patient:  Devin Tran  DOB:  1966-01-13  MRN:  [id number]   Preop Diagnosis: Lymphadenopathy, history of Hodgkin's lymphoma  Postop Diagnosis: Same  Procedure: Right axillary lymph node biopsy  Surgeon: Aviva Signs, MD  Anes: General endotracheal  Indications: Patient is a 54 year old white male who was referred to my care by oncology for a right axillary lymph node biopsy.  He has a history of Hodgkin's lymphoma and there is concerned that it has recurred.  The risks and benefits of the procedure including bleeding, infection, and nerve injury were fully explained to the patient, who gave informed consent.  Procedure note: The patient was placed in supine position.  After induction of general endotracheal anesthesia, the right axilla was prepped and draped using the usual sterile technique with ChloraPrep.  Surgical site confirmation was performed.  Incision was made in the right axilla down to the palpable lymph nodes.  The lymph nodes were removed once medium clips were used to control hemostasis.  2 enlarged lymph nodes were removed and sent to pathology for flow cytometry.  Limited Bovie electrocautery was used superficially.  0.5% Sensorcaine was instilled into the surrounding wound.  No bleeding was noted at the end of the procedure.  The subcutaneous layer was reapproximated using a 3-0 Vicryl interrupted suture.  The skin was closed using a 4-0 Monocryl subcuticular suture.  Dermabond was applied.  All tape and needle counts were correct at the end of the procedure.  The patient was extubated in the operating room and transferred to PACU in stable condition.  Complications: None  EBL: Minimal  Specimen: Right axillary lymph nodes

## 2018-05-10 NOTE — Discharge Instructions (Signed)
Open Lymph Node Biopsy, Care After  This sheet gives you information about how to care for yourself after your procedure. Your health care provider may also give you more specific instructions. If you have problems or questions, contact your health care provider.  What can I expect after the procedure?  After the procedure, it is common to have:   Bruising.   Soreness.   Mild swelling.  Follow these instructions at home:  Medicines   Take over-the-counter and prescription medicines only as told by your health care provider.   If you were prescribed an antibiotic medicine, take it as told by your health care provider. Do not stop taking the antibiotic even if you start to feel better.  Incision care     Follow instructions from your health care provider about how to take care of your incision. Make sure you:  ? Wash your hands with soap and water before and after you change your bandage (dressing). If soap and water are not available, use hand sanitizer.  ? Change your dressing as told by your health care provider.  ? Leave stitches (sutures), skin glue, or adhesive strips in place. These skin closures may need to stay in place for 2 weeks or longer. If adhesive strip edges start to loosen and curl up, you may trim the loose edges. Do not remove adhesive strips completely unless your health care provider tells you to do that.   Check your incision area every day for signs of infection. Check for:  ? More redness, swelling, or pain.  ? Fluid or blood.  ? Warmth.  ? Pus or a bad smell.  Driving   Do not drive for 24 hours if you were given a sedative during your procedure.   Do not drive or use heavy machinery while taking prescription pain medicine.  General instructions   Return to your normal activities as told by your health care provider. Ask your health care provider what activities are safe for you.   Do not take baths, swim, or use a hot tub until your health care provider approves. Ask your health  care provider if you may take showers. You may only be allowed to take sponge baths.   Keep all follow-up visits as told by your health care provider. This is important.  Contact a health care provider if:   You have more redness, swelling, or pain around your incision.   You have fluid or blood coming from your incision.   Your incision feels warm to the touch.   You have pus or a bad smell coming from your incision.   You have a fever.   You have pain or numbness that gets worse or lasts longer than a few days.  Summary   After a lymph node biopsy, it is common to have bruising, soreness, and mild swelling.   Follow your health care provider's instructions about taking care of yourself at home. You will be told how to take medicines, take care of your incision, and check for infection.   Return to your normal activities as told by your health care provider. Ask your health care provider what activities are safe for you.   Contact a health care provider if you have more redness, swelling, or pain around your incision, you have a fever, or you have worsening pain or numbness.  This information is not intended to replace advice given to you by your health care provider. Make sure you discuss any questions   you have with your health care provider.  Document Released: 02/16/2015 Document Revised: 08/27/2017 Document Reviewed: 08/27/2017  Elsevier Interactive Patient Education  2019 Elsevier Inc.

## 2018-05-10 NOTE — H&P (Addendum)
Devin Tran is an 53 y.o. male.   Chief Complaint: History of Hodgkin's lymphoma, lymphadenopathy HPI: Patient is a 53 year old black male with a history of Hodgkin's lymphoma who presents with worsening generalized lymphadenopathy.  Oncology is concerned that he may have recurrence.  He now presents for a lymph node biopsy.  He denies any pain at this time.  He does have some generalized malaise and decreased energy.  Past Medical History:  Diagnosis Date  . Complication of anesthesia   . Erectile dysfunction   . Hodgkin's lymphoma ([place])   . Hyperlipidemia   . Hypertension   . Infertility   . Low testosterone   . PONV (postoperative nausea and vomiting)     Past Surgical History:  Procedure Laterality Date  . PORTA CATH INSERTION    . PORTA CATH REMOVAL    . SPLENECTOMY Left Over 20 years ago   Due to Hodgkin's lymphoma    Family History  Problem Relation Age of Onset  . Hypertension Mother   . Cancer Father        lung  . Hypertension Father   . Diabetes Father   . COPD Father   . Hypertension Brother   . Stroke Brother    Social History:  reports that he has never smoked. He has never used smokeless tobacco. He reports that he does not drink alcohol or use drugs.  Allergies:  Allergies  Allergen Reactions  . Bleomycin Other (See Comments)    Cramping all over    Medications Prior to Admission  Medication Sig Dispense Refill  . amLODipine (NORVASC) 5 MG tablet TAKE 1 TABLET BY MOUTH EVERY DAY (Patient taking differently: Take 5 mg by mouth daily. TAKE 1 TABLET BY MOUTH EVERY DAY) 90 tablet 1  . amoxicillin-clavulanate (AUGMENTIN) 875-125 MG tablet Take 1 tablet by mouth 2 (two) times daily for 10 days. 20 tablet 0  . aspirin EC 81 MG tablet Take 81 mg by mouth daily.     . Multiple Vitamins-Minerals (ONE-A-DAY MENS HEALTH FORMULA PO) Take 1 tablet by mouth daily.     Marland Kitchen Propylene Glycol (SYSTANE BALANCE) 0.6 % SOLN Place 1 drop into both eyes 2 (two) times daily as  needed (dry eyes).      No results found for this or any previous visit (from the past 48 hour(s)). No results found.  Review of Systems  Constitutional: Positive for malaise/fatigue.  HENT: Negative.   Eyes: Negative.   Respiratory: Negative.   Cardiovascular: Negative.   Gastrointestinal: Negative.   Genitourinary: Negative.   Musculoskeletal: Negative.   Skin: Negative.   Neurological: Negative.   Endo/Heme/Allergies: Negative.   Psychiatric/Behavioral: Negative.     Blood pressure (!) 154/107, pulse 77, temperature 98.8 F (37.1 C), temperature source Oral, resp. rate (!) 28, height 6\' 2"  (1.88 m), weight 106.9 kg, SpO2 98 %. Physical Exam  Vitals reviewed. Constitutional: He is oriented to person, place, and time. He appears well-developed and well-nourished. No distress.  HENT:  Head: Normocephalic and atraumatic.  Cardiovascular: Normal rate, regular rhythm and normal heart sounds. Exam reveals no gallop and no friction rub.  No murmur heard. Respiratory: Effort normal and breath sounds normal. No respiratory distress. He has no wheezes. He has no rales.  Neurological: He is alert and oriented to person, place, and time.  Skin: Skin is warm and dry.  Axillas: Bilateral palpable lymph nodes.  Oncology notes reviewed  Assessment/Plan Impression: History of Hodgkin's lymphoma, recurrent generalized lymphadenopathy Plan: Patient will  undergo right axillary lymph node biopsy for flow cytometry today.  The risks and benefits of the procedure including bleeding, nerve injury, infection, and the possibly of recurrence of malignancy were fully explained to the patient, who gave informed consent.  Oncology has determined that the patient needs urgent biopsy as he cannot wait 4 weeks for an elective procedure as he may be started on chemotherapy urgently given results.  Aviva Signs, MD 05/10/2018, 9:41 AM

## 2018-05-10 NOTE — Anesthesia Preprocedure Evaluation (Addendum)
Anesthesia Evaluation  Patient identified by MRN, date of birth, ID band Patient awake    Reviewed: Allergy & Precautions, NPO status , Patient's Chart, lab work & pertinent test results  History of Anesthesia Complications (+) PONV  Airway Mallampati: II  TM Distance: >3 FB Neck ROM: Full    Dental no notable dental hx. (+) Teeth Intact   Pulmonary neg pulmonary ROS,    Pulmonary exam normal breath sounds clear to auscultation       Cardiovascular Exercise Tolerance: Good hypertension, Pt. on medications negative cardio ROS Normal cardiovascular examI Rhythm:Regular Rate:Normal     Neuro/Psych negative neurological ROS  negative psych ROS   GI/Hepatic negative GI ROS, Neg liver ROS,   Endo/Other  negative endocrine ROS  Renal/GU negative Renal ROS  negative genitourinary   Musculoskeletal negative musculoskeletal ROS (+)   Abdominal   Peds negative pediatric ROS (+)  Hematology negative hematology ROS (+)   Anesthesia Other Findings   Reproductive/Obstetrics negative OB ROS                             Anesthesia Physical Anesthesia Plan  ASA: II and emergent  Anesthesia Plan: General and MAC   Post-op Pain Management:    Induction: Intravenous  PONV Risk Score and Plan:   Airway Management Planned: Simple Face Mask and Oral ETT  Additional Equipment:   Intra-op Plan:   Post-operative Plan: Extubation in OR  Informed Consent: I have reviewed the patients History and Physical, chart, labs and discussed the procedure including the risks, benefits and alternatives for the proposed anesthesia with the patient or authorized representative who has indicated his/her understanding and acceptance.     Dental advisory given  Plan Discussed with: CRNA  Anesthesia Plan Comments: (D/w pt either GETA or MAC -pt ok with either  Dr. Lenna Sciara to evaluate node and will make a  recommendation After d/w Dr. Arnoldo Morale we will start off with ETT Full PPE planned )       Anesthesia Quick Evaluation

## 2018-05-10 NOTE — Transfer of Care (Signed)
Immediate Anesthesia Transfer of Care Note  Patient: Devin Tran  Procedure(s) Performed: AXILLARY LYMPH NODE BIOPSY, RIGHT (Right Axilla)  Patient Location: PACU  Anesthesia Type:General  Level of Consciousness: drowsy and patient cooperative  Airway & Oxygen Therapy: Patient Spontanous Breathing and Patient connected to face mask oxygen  Post-op Assessment: Report given to RN, Post -op Vital signs reviewed and stable and Patient moving all extremities  Post vital signs: Reviewed and stable  Last Vitals:  Vitals Value Taken Time  BP    Temp    Pulse 73 05/10/2018 10:56 AM  Resp 21 05/10/2018 10:56 AM  SpO2 100 % 05/10/2018 10:56 AM  Vitals shown include unvalidated device data.  Last Pain:  Vitals:   05/10/18 0921  TempSrc:   PainSc: 0-No pain         Complications: No apparent anesthesia complications

## 2018-05-10 NOTE — Anesthesia Postprocedure Evaluation (Signed)
Anesthesia Post Note  Patient: Devin Tran  Procedure(s) Performed: AXILLARY LYMPH NODE BIOPSY, RIGHT (Right Axilla)  Patient location during evaluation: PACU Anesthesia Type: MAC and General Level of consciousness: awake and patient cooperative Pain management: pain level controlled Vital Signs Assessment: post-procedure vital signs reviewed and stable Respiratory status: spontaneous breathing, nonlabored ventilation and respiratory function stable Cardiovascular status: blood pressure returned to baseline Postop Assessment: no apparent nausea or vomiting Anesthetic complications: no     Last Vitals:  Vitals:   05/10/18 1057 05/10/18 1100  BP: (!) 130/94   Pulse: 76 68  Resp: (!) 22 (!) 21  Temp: 36.9 C   SpO2: 98% 98%    Last Pain:  Vitals:   05/10/18 0921  TempSrc:   PainSc: 0-No pain                 Nilza Eaker J

## 2018-05-10 NOTE — Interval H&P Note (Signed)
History and Physical Interval Note:  05/10/2018 9:45 AM  Devin Tran  has presented today for surgery, with the diagnosis of lymphadenopathy, rule out Hodgkin's lymphoma.  The various methods of treatment have been discussed with the patient and family. After consideration of risks, benefits and other options for treatment, the patient has consented to  Procedure(s): AXILLARY LYMPH NODE BIOPSY (Right) as a surgical intervention.  The patient's history has been reviewed, patient examined, no change in status, stable for surgery.  I have reviewed the patient's chart and labs.  Questions were answered to the patient's satisfaction.     Aviva Signs

## 2018-05-10 NOTE — Anesthesia Procedure Notes (Signed)
Procedure Name: Intubation Date/Time: 05/10/2018 10:13 AM Performed by: Charmaine Downs, CRNA Pre-anesthesia Checklist: Patient identified, Patient being monitored, Timeout performed, Emergency Drugs available and Suction available Patient Re-evaluated:Patient Re-evaluated prior to induction Oxygen Delivery Method: Circle System Utilized Preoxygenation: Pre-oxygenation with 100% oxygen Induction Type: IV induction Ventilation: Mask ventilation without difficulty Laryngoscope Size: Mac and 4 Grade View: Grade II Tube type: Oral Tube size: 8.0 mm Number of attempts: 1 Airway Equipment and Method: stylet Placement Confirmation: ETT inserted through vocal cords under direct vision,  positive ETCO2 and breath sounds checked- equal and bilateral Secured at: 22 cm Tube secured with: Tape Dental Injury: Teeth and Oropharynx as per pre-operative assessment

## 2018-05-11 ENCOUNTER — Encounter (HOSPITAL_COMMUNITY): Payer: Self-pay | Admitting: General Surgery

## 2018-05-11 ENCOUNTER — Encounter: Payer: Self-pay | Admitting: General Practice

## 2018-05-11 NOTE — Progress Notes (Signed)
[facility] Psychosocial Distress Screening Clinical Social Work  Clinical Social Work was referred by distress screening protocol.  The patient scored a 10 on the Psychosocial Distress Thermometer which indicates severe distress. Clinical Social Worker contacted patient by phone to assess for distress and other psychosocial needs. Unable to reach patient by phone - left VM w my contact information and encouraged him to call w any needs for support/resources.  [facility] DISTRESS SCREENING 05/06/2018  Screening Type Initial Screening  Distress experienced in past week (1-10) 10  Information Concerns Type Lack of info about diagnosis  Physician notified of physical symptoms Yes  Referral to clinical psychology No  Referral to clinical social work No  Referral to dietition No  Referral to financial advocate No  Referral to support programs No  Referral to palliative care No     Clinical Social Worker follow up needed: No.  Patient to call if desired  If yes, follow up plan:  Beverely Pace, [place], LCSW Clinical Social Worker Phone:  [phone]

## 2018-05-12 ENCOUNTER — Other Ambulatory Visit: Payer: Self-pay

## 2018-05-12 ENCOUNTER — Ambulatory Visit (HOSPITAL_COMMUNITY)
Admission: RE | Admit: 2018-05-12 | Discharge: 2018-05-12 | Disposition: A | Discharge status: Home / Self Care | Payer: BLUE CROSS/BLUE SHIELD | Source: Ambulatory Visit | Attending: Hematology | Admitting: Hematology

## 2018-05-12 ENCOUNTER — Ambulatory Visit (HOSPITAL_COMMUNITY): Payer: BLUE CROSS/BLUE SHIELD

## 2018-05-12 DIAGNOSIS — Z8571 Personal history of Hodgkin lymphoma: Secondary | ICD-10-CM | POA: Insufficient documentation

## 2018-05-12 DIAGNOSIS — C8590 Non-Hodgkin lymphoma, unspecified, unspecified site: Secondary | ICD-10-CM | POA: Diagnosis not present

## 2018-05-12 LAB — GLUCOSE, CAPILLARY: Glucose-Capillary: 106 mg/dL — ABNORMAL HIGH (ref 70–99)

## 2018-05-12 MED ORDER — FLUDEOXYGLUCOSE F - 18 (FDG) INJECTION
11.5000 | Freq: Once | INTRAVENOUS | Status: AC | PRN
  Administered 2018-05-12: 10:00:00 11.5 via INTRAVENOUS

## 2018-05-13 ENCOUNTER — Encounter (HOSPITAL_COMMUNITY): Payer: Self-pay | Admitting: General Surgery

## 2018-05-13 NOTE — Addendum Note (Signed)
Addendum  created 05/13/18 0751 by Vista Deck, CRNA   Intraprocedure Flowsheets edited

## 2018-05-19 ENCOUNTER — Inpatient Hospital Stay (HOSPITAL_BASED_OUTPATIENT_CLINIC_OR_DEPARTMENT_OTHER): Payer: BLUE CROSS/BLUE SHIELD | Admitting: Hematology

## 2018-05-19 ENCOUNTER — Encounter (HOSPITAL_COMMUNITY): Payer: Self-pay | Admitting: Hematology

## 2018-05-19 ENCOUNTER — Other Ambulatory Visit: Payer: Self-pay

## 2018-05-19 ENCOUNTER — Inpatient Hospital Stay (HOSPITAL_COMMUNITY): Payer: BLUE CROSS/BLUE SHIELD

## 2018-05-19 VITALS — BP 141/103 | HR 83 | Temp 98.8°F | Resp 14 | Wt 232.4 lb

## 2018-05-19 DIAGNOSIS — C8104 Nodular lymphocyte predominant Hodgkin lymphoma, lymph nodes of axilla and upper limb: Secondary | ICD-10-CM | POA: Diagnosis not present

## 2018-05-19 DIAGNOSIS — R591 Generalized enlarged lymph nodes: Secondary | ICD-10-CM

## 2018-05-19 DIAGNOSIS — Z8571 Personal history of Hodgkin lymphoma: Secondary | ICD-10-CM

## 2018-05-19 DIAGNOSIS — R590 Localized enlarged lymph nodes: Secondary | ICD-10-CM | POA: Diagnosis not present

## 2018-05-19 DIAGNOSIS — Z5111 Encounter for antineoplastic chemotherapy: Secondary | ICD-10-CM | POA: Diagnosis not present

## 2018-05-19 DIAGNOSIS — Z23 Encounter for immunization: Secondary | ICD-10-CM | POA: Diagnosis not present

## 2018-05-19 DIAGNOSIS — Z5112 Encounter for antineoplastic immunotherapy: Secondary | ICD-10-CM | POA: Diagnosis not present

## 2018-05-19 DIAGNOSIS — Z5189 Encounter for other specified aftercare: Secondary | ICD-10-CM | POA: Diagnosis not present

## 2018-05-19 LAB — URIC ACID: Uric Acid, Serum: 5.9 mg/dL (ref 3.7–8.6)

## 2018-05-19 NOTE — Assessment & Plan Note (Signed)
1.  Right axillary lymphadenopathy: -Patient reports history of Hodgkin's disease in the neck region initially diagnosed in 63 at age 53 and underwent chemotherapy. -He reportedly had recurrence and was treated again in 1991.  He was treated by Dr. Sonny Dandy in [place].  He also had a splenectomy at that time. -She reported right axillary lump for the last 2 months.  He also reports night sweats for the last 2 months.  Denies any fevers or weight loss. -He is continuing to work and also plays basketball.  He lives in [place] with his girlfriend. - CT of the chest on 05/04/2018 shows 3.2 cm right axillary lymph node along with multiple small lymph nodes.  Several left axillary lymph node measuring up to 2.8 cm.  No mediastinal or hilar adenopathy.  There is supraclavicular adenopathy. - Family history significant for mother with breast cancer and father with lung cancer who was a smoker. - He had biopsy of the right axillary lymph node. -PET/CT scan on 05/12/2018 shows hypermetabolic lymphadenopathy in the bilateral axillary and subpectoral region, right subclavicular region and portacaval space. 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  However we are waiting for final report. - LDH and beta-2 microglobulin were normal.  I have recommended pulmonary function testing as well as a 2D echocardiogram. -We will also request a port placement by Dr. Arnoldo Morale.  We talked about fertility preservation.  Patient is not interested at this time. -We will also do HIV testing and serology for hepatitis B and C.  Will check ESR level. -I will see him back in 1 week for follow-up.  2.  Postsplenectomy state: -He does not report any recent vaccination other than flu shot. -We will make sure that he gets meningococcal and pneumococcal vaccines.

## 2018-05-19 NOTE — Progress Notes (Signed)
Devin Tran,  [id number]   CLINIC:  Medical Oncology/Hematology  PCP:  Devin Tran, Devin Tran [phone]   REASON FOR VISIT:  Follow-up for Right axillary adenopathy     INTERVAL HISTORY:  Devin Tran 53 y.o. male returns for routine follow-up. He is here today alone. He states that he has been doing good since his last visit. Denies any nausea, vomiting, or diarrhea. Denies any new pains. Had not noticed any recent bleeding such as epistaxis, hematuria or hematochezia. Denies recent chest pain on exertion, shortness of breath on minimal exertion, pre-syncopal episodes, or palpitations. Denies any numbness or tingling in hands or feet. Denies any recent fevers, infections, or recent hospitalizations. Patient reports appetite at 75% and energy level at 75%.    REVIEW OF SYSTEMS:  Review of Systems  Gastrointestinal: Positive for constipation.  Skin: Positive for itching.  Neurological: Positive for numbness.  Psychiatric/Behavioral: Positive for sleep disturbance. The patient is nervous/anxious.      PAST MEDICAL/SURGICAL HISTORY:  Past Medical History:  Diagnosis Date  . Complication of anesthesia   . Erectile dysfunction   . Hodgkin's lymphoma ([place])   . Hyperlipidemia   . Hypertension   . Infertility   . Low testosterone   . PONV (postoperative nausea and vomiting)    Past Surgical History:  Procedure Laterality Date  . AXILLARY LYMPH NODE BIOPSY Right 05/10/2018   Procedure: AXILLARY LYMPH NODE BIOPSY, RIGHT;  Surgeon: Aviva Signs, MD;  Location: [facility];  Service: General;  Laterality: Right;  . PORTA CATH INSERTION    . PORTA CATH REMOVAL    . SPLENECTOMY Left Over 20 years ago   Due to Hodgkin's lymphoma     SOCIAL HISTORY:  Social History   Socioeconomic History  . Marital status: Single    Spouse name: Not on file  . Number of children: Not on file  . Years of education: Not on  file  . Highest education level: Not on file  Occupational History  . Occupation: Drive Chief Operating Officer: [employer]  . Financial resource strain: Not on file  . Food insecurity:    Worry: Not on file    Inability: Not on file  . Transportation needs:    Medical: Not on file    Non-medical: Not on file  Tobacco Use  . Smoking status: Never Smoker  . Smokeless tobacco: Never Used  Substance and Sexual Activity  . Alcohol use: No  . Drug use: No  . Sexual activity: Not on file  Lifestyle  . Physical activity:    Days per week: Not on file    Minutes per session: Not on file  . Stress: Not on file  Relationships  . Social connections:    Talks on phone: Not on file    Gets together: Not on file    Attends religious service: Not on file    Active member of club or organization: Not on file    Attends meetings of clubs or organizations: Not on file    Relationship status: Not on file  . Intimate partner violence:    Fear of current or ex partner: Not on file    Emotionally abused: Not on file    Physically abused: Not on file    Forced sexual activity: Not on file  Other Topics Concern  . Not on file  Social History Narrative  .  Not on file    FAMILY HISTORY:  Family History  Problem Relation Age of Onset  . Hypertension Mother   . Cancer Father        lung  . Hypertension Father   . Diabetes Father   . COPD Father   . Hypertension Brother   . Stroke Brother     CURRENT MEDICATIONS:  Outpatient Encounter Medications as of 05/19/2018  Medication Sig  . amLODipine (NORVASC) 5 MG tablet TAKE 1 TABLET BY MOUTH EVERY DAY (Patient taking differently: Take 5 mg by mouth daily. TAKE 1 TABLET BY MOUTH EVERY DAY)  . aspirin EC 81 MG tablet Take 81 mg by mouth daily.   . Multiple Vitamins-Minerals (ONE-A-DAY MENS HEALTH FORMULA PO) Take 1 tablet by mouth daily.   Marland Kitchen Propylene Glycol (SYSTANE BALANCE) 0.6 % SOLN Place 1 drop into both eyes 2  (two) times daily as needed (dry eyes).  . [DISCONTINUED] HYDROcodone-acetaminophen (NORCO) 5-325 MG tablet Take 1 tablet by mouth every 4 (four) hours as needed for moderate pain. (Patient not taking: Reported on 05/19/2018)   No facility-administered encounter medications on file as of 05/19/2018.     ALLERGIES:  Allergies  Allergen Reactions  . Bleomycin Other (See Comments)    Cramping all over     PHYSICAL EXAM:  ECOG Performance status: 0  Vitals:   05/19/18 1100 05/19/18 1110  BP: (!) 160/96 (!) 141/103  Pulse: 83   Resp: 14   Temp: 99.2 F (37.3 C) 98.8 F (37.1 C)  SpO2: 97%    Filed Weights   05/19/18 1100  Weight: 232 lb 6.4 oz (105.4 kg)    Physical Exam Constitutional:      Appearance: Normal appearance.  Cardiovascular:     Rate and Rhythm: Normal rate and regular rhythm.     Heart sounds: Normal heart sounds.  Pulmonary:     Effort: Pulmonary effort is normal.     Breath sounds: Normal breath sounds.  Abdominal:     General: There is no distension.     Palpations: Abdomen is soft. There is no mass.  Musculoskeletal:        General: No swelling.  Lymphadenopathy:     Cervical: Cervical adenopathy present.  Skin:    General: Skin is warm.  Neurological:     General: No focal deficit present.     Mental Status: He is alert and oriented to person, place, and time.  Psychiatric:        Mood and Affect: Mood normal.        Behavior: Behavior normal.    Positive for axillary adenopathy.  Right axillary wound is healing well.  LABORATORY DATA:  I have reviewed the labs as listed.  CBC    Component Value Date/Time   WBC 7.3 05/03/2018 1553   WBC 7.0 05/20/2013 1121   RBC 6.09 (H) 05/03/2018 1553   RBC 5.8 05/20/2013 1121   HGB 16.1 05/03/2018 1553   HCT 49.8 05/03/2018 1553   PLT 280 05/03/2018 1553   MCV 82 05/03/2018 1553   MCH 26.4 (L) 05/03/2018 1553   MCH 25.5 (A) 05/20/2013 1121   MCHC 32.3 05/03/2018 1553   MCHC 29.9 (A)  05/20/2013 1121   RDW 14.4 05/03/2018 1553   LYMPHSABS 4.5 (H) 05/03/2018 1553   EOSABS 0.2 05/03/2018 1553   BASOSABS 0.1 05/03/2018 1553   CMP Latest Ref Rng & Units 05/06/2018 05/03/2018 01/12/2018  Glucose 65 - 99 mg/dL - 97 104(H)  BUN 6 - 24 mg/dL - 16 14  Creatinine 0.76 - 1.27 mg/dL - 1.18 1.13  Sodium 134 - 144 mmol/L - 144 140  Potassium 3.5 - 5.2 mmol/L - 3.8 4.7  Chloride 96 - 106 mmol/L - 101 100  CO2 20 - 29 mmol/L - 23 24  Calcium 8.7 - 10.2 mg/dL - 10.1 10.2  Total Protein 6.5 - 8.1 g/dL 8.2(H) 7.4 7.0  Total Bilirubin 0.3 - 1.2 mg/dL 0.9 0.4 0.5  Alkaline Phos 38 - 126 U/L 70 78 78  AST 15 - 41 U/L _0 ALT 0 - 44 U/L 32 25 30       DIAGNOSTIC IMAGING:  I have independently reviewed the scans and discussed with the patient.   I have reviewed Venita Lick LPN's note and agree with the documentation.  I personally performed a face-to-face visit, made revisions and my assessment and plan is as follows.    ASSESSMENT & PLAN:   Hodgkin's lymphoma 1.  Right axillary lymphadenopathy: -Patient reports history of Hodgkin's disease in the neck region initially diagnosed in [birth year] at age 50 and underwent chemotherapy. -He reportedly had recurrence and was treated again in 1991.  He was treated by Dr. Sonny Dandy in [place].  He also had a splenectomy at that time. -She reported right axillary lump for the last 2 months.  He also reports night sweats for the last 2 months.  Denies any fevers or weight loss. -He is continuing to work and also plays basketball.  He lives in [place] with his girlfriend. - CT of the chest on 05/04/2018 shows 3.2 cm right axillary lymph node along with multiple small lymph nodes.  Several left axillary lymph node measuring up to 2.8 cm.  No mediastinal or hilar adenopathy.  There is supraclavicular adenopathy. - Family history significant for mother with breast cancer and father with lung cancer who was a smoker. - He had biopsy of the right  axillary lymph node. -PET/CT scan on 05/12/2018 shows hypermetabolic lymphadenopathy in the bilateral axillary and subpectoral region, right subclavicular region and portacaval space. 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  However we are waiting for final report. - LDH and beta-2 microglobulin were normal.  I have recommended pulmonary function testing as well as a 2D echocardiogram. -We will also request a port placement by Dr. Arnoldo Morale.  We talked about fertility preservation.  Patient is not interested at this time. -We will also do HIV testing and serology for hepatitis B and C.  Will check ESR level. -I will see him back in 1 week for follow-up.  2.  Postsplenectomy state: -He does not report any recent vaccination other than flu shot. -We will make sure that he gets meningococcal and pneumococcal vaccines.      Orders placed this encounter:  Orders Placed This Encounter  Procedures  . Sedimentation rate  . HIV antibody (with reflex)  . Hepatitis B surface antigen  . Hepatitis B surface antibody  . Hepatitis C Antibody  . Hepatitis B core antibody, total  . Uric acid  . ECHOCARDIOGRAM COMPLETE  . Pulmonary Function Test      Derek Jack, MD [place] [phone]

## 2018-05-19 NOTE — Patient Instructions (Addendum)
[place] at [facility] Discharge Instructions  You were seen today by Dr. Delton Coombes. He went over your recent scan results. He will see you back in 1 weeks for labs and follow up.   Thank you for choosing [place] at [facility] to provide your oncology and hematology care.  To afford each patient quality time with our provider, please arrive at least 15 minutes before your scheduled appointment time.   If you have a lab appointment with the [facility] please come in thru the  Main Entrance and check in at the main information desk  You need to re-schedule your appointment should you arrive 10 or more minutes late.  We strive to give you quality time with our providers, and arriving late affects you and other patients whose appointments are after yours.  Also, if you no show three or more times for appointments you may be dismissed from the clinic at the providers discretion.     Again, thank you for choosing [facility].  Our hope is that these requests will decrease the amount of time that you wait before being seen by our physicians.       _____________________________________________________________  Should you have questions after your visit to [facility], please contact our office at [phone] between the hours of 8:00 a.m. and 4:30 p.m.  Voicemails left after 4:00 p.m. will not be returned until the following business day.  For prescription refill requests, have your pharmacy contact our office and allow 72 hours.    Cancer Center Support Programs:   > Cancer Support Group  2nd Tuesday of the month 1pm-2pm, Journey Room

## 2018-05-20 ENCOUNTER — Encounter (HOSPITAL_COMMUNITY): Payer: Self-pay

## 2018-05-20 ENCOUNTER — Encounter (HOSPITAL_COMMUNITY)
Admission: RE | Admit: 2018-05-20 | Discharge: 2018-05-20 | Disposition: A | Discharge status: Home / Self Care | Payer: BLUE CROSS/BLUE SHIELD | Source: Ambulatory Visit | Attending: General Surgery | Admitting: General Surgery

## 2018-05-20 ENCOUNTER — Other Ambulatory Visit: Payer: Self-pay

## 2018-05-20 LAB — HEPATITIS B SURFACE ANTIGEN: Hepatitis B Surface Ag: NEGATIVE

## 2018-05-20 LAB — HEPATITIS B CORE ANTIBODY, TOTAL: Hep B Core Total Ab: NEGATIVE

## 2018-05-20 LAB — HEPATITIS B SURFACE ANTIBODY,QUALITATIVE: Hep B S Ab: NONREACTIVE

## 2018-05-20 LAB — HEPATITIS C ANTIBODY: HCV Ab: <0.1 s/co ratio (ref 0.0–0.9)

## 2018-05-21 ENCOUNTER — Encounter (HOSPITAL_COMMUNITY)
Admission: RE | Disposition: A | Discharge status: Home / Self Care | Payer: Self-pay | Source: Home / Self Care | Attending: General Surgery

## 2018-05-21 ENCOUNTER — Other Ambulatory Visit: Payer: Self-pay

## 2018-05-21 ENCOUNTER — Ambulatory Visit (HOSPITAL_COMMUNITY): Payer: BLUE CROSS/BLUE SHIELD | Admitting: Anesthesiology

## 2018-05-21 ENCOUNTER — Encounter (HOSPITAL_COMMUNITY): Payer: Self-pay

## 2018-05-21 ENCOUNTER — Ambulatory Visit (HOSPITAL_COMMUNITY): Payer: BLUE CROSS/BLUE SHIELD

## 2018-05-21 ENCOUNTER — Ambulatory Visit (HOSPITAL_COMMUNITY)
Admission: RE | Admit: 2018-05-21 | Discharge: 2018-05-21 | Disposition: A | Discharge status: Home / Self Care | Payer: BLUE CROSS/BLUE SHIELD | Attending: General Surgery | Admitting: General Surgery

## 2018-05-21 DIAGNOSIS — C8190 Hodgkin lymphoma, unspecified, unspecified site: Secondary | ICD-10-CM | POA: Diagnosis not present

## 2018-05-21 DIAGNOSIS — C8191 Hodgkin lymphoma, unspecified, lymph nodes of head, face, and neck: Secondary | ICD-10-CM

## 2018-05-21 DIAGNOSIS — Z452 Encounter for adjustment and management of vascular access device: Secondary | ICD-10-CM | POA: Diagnosis not present

## 2018-05-21 DIAGNOSIS — I10 Essential (primary) hypertension: Secondary | ICD-10-CM | POA: Insufficient documentation

## 2018-05-21 DIAGNOSIS — E785 Hyperlipidemia, unspecified: Secondary | ICD-10-CM | POA: Diagnosis not present

## 2018-05-21 DIAGNOSIS — Z7982 Long term (current) use of aspirin: Secondary | ICD-10-CM | POA: Insufficient documentation

## 2018-05-21 DIAGNOSIS — Z79899 Other long term (current) drug therapy: Secondary | ICD-10-CM | POA: Insufficient documentation

## 2018-05-21 DIAGNOSIS — Z95828 Presence of other vascular implants and grafts: Secondary | ICD-10-CM

## 2018-05-21 DIAGNOSIS — C8590 Non-Hodgkin lymphoma, unspecified, unspecified site: Secondary | ICD-10-CM | POA: Diagnosis not present

## 2018-05-21 HISTORY — PX: PORTACATH PLACEMENT: SHX2246

## 2018-05-21 SURGERY — INSERTION, TUNNELED CENTRAL VENOUS DEVICE, WITH PORT
Anesthesia: General | Site: Chest | Laterality: Left

## 2018-05-21 MED ORDER — HYDROCODONE-ACETAMINOPHEN 5-325 MG PO TABS: 1.0000 | ORAL_TABLET | ORAL | 0 refills | Status: DC | PRN

## 2018-05-21 MED ORDER — LIDOCAINE HCL (PF) 1 % IJ SOLN
INTRAMUSCULAR | Status: AC
  Filled 2018-05-21: qty 30

## 2018-05-21 MED ORDER — PROMETHAZINE HCL 25 MG/ML IJ SOLN: 6.2500 mg | INTRAMUSCULAR | Status: DC | PRN

## 2018-05-21 MED ORDER — HEPARIN SOD (PORK) LOCK FLUSH 100 UNIT/ML IV SOLN
INTRAVENOUS | Status: DC | PRN
  Administered 2018-05-21: 500 [IU] via INTRAVENOUS

## 2018-05-21 MED ORDER — PROPOFOL 10 MG/ML IV BOLUS
INTRAVENOUS | Status: DC | PRN
  Administered 2018-05-21: 10 mg via INTRAVENOUS
  Administered 2018-05-21 (×5): 20 mg via INTRAVENOUS

## 2018-05-21 MED ORDER — CEFAZOLIN SODIUM-DEXTROSE 2-4 GM/100ML-% IV SOLN
INTRAVENOUS | Status: AC
  Filled 2018-05-21: qty 100

## 2018-05-21 MED ORDER — SUCCINYLCHOLINE CHLORIDE 200 MG/10ML IV SOSY
PREFILLED_SYRINGE | INTRAVENOUS | Status: AC
  Filled 2018-05-21: qty 20

## 2018-05-21 MED ORDER — CEFAZOLIN SODIUM-DEXTROSE 2-4 GM/100ML-% IV SOLN
2.0000 g | INTRAVENOUS | Status: AC
  Administered 2018-05-21: 08:00:00 2 g via INTRAVENOUS

## 2018-05-21 MED ORDER — MIDAZOLAM HCL 2 MG/2ML IJ SOLN: 0.5000 mg | Freq: Once | INTRAMUSCULAR | Status: DC | PRN

## 2018-05-21 MED ORDER — PROPOFOL 500 MG/50ML IV EMUL
INTRAVENOUS | Status: DC | PRN
  Administered 2018-05-21: 75 ug/kg/min via INTRAVENOUS
  Administered 2018-05-21: 08:00:00 via INTRAVENOUS

## 2018-05-21 MED ORDER — MIDAZOLAM HCL 2 MG/2ML IJ SOLN
INTRAMUSCULAR | Status: AC
  Filled 2018-05-21: qty 2

## 2018-05-21 MED ORDER — PROPOFOL 10 MG/ML IV BOLUS
INTRAVENOUS | Status: AC
  Filled 2018-05-21: qty 20

## 2018-05-21 MED ORDER — CHLORHEXIDINE GLUCONATE CLOTH 2 % EX PADS: 6.0000 | MEDICATED_PAD | Freq: Once | CUTANEOUS | Status: DC

## 2018-05-21 MED ORDER — HYDROMORPHONE HCL 1 MG/ML IJ SOLN: 0.2500 mg | INTRAMUSCULAR | Status: DC | PRN

## 2018-05-21 MED ORDER — KETOROLAC TROMETHAMINE 30 MG/ML IJ SOLN
30.0000 mg | Freq: Once | INTRAMUSCULAR | Status: AC
  Administered 2018-05-21: 30 mg via INTRAVENOUS
  Filled 2018-05-21: qty 1

## 2018-05-21 MED ORDER — LACTATED RINGERS IV SOLN
INTRAVENOUS | Status: DC
  Administered 2018-05-21: 07:00:00 via INTRAVENOUS

## 2018-05-21 MED ORDER — ROCURONIUM BROMIDE 10 MG/ML (PF) SYRINGE
PREFILLED_SYRINGE | INTRAVENOUS | Status: AC
  Filled 2018-05-21: qty 20

## 2018-05-21 MED ORDER — SODIUM CHLORIDE (PF) 0.9 % IJ SOLN
INTRAMUSCULAR | Status: DC | PRN
  Administered 2018-05-21: 500 mL

## 2018-05-21 MED ORDER — LIDOCAINE HCL (PF) 1 % IJ SOLN
INTRAMUSCULAR | Status: DC | PRN
  Administered 2018-05-21: 7 mL
  Administered 2018-05-21: 2 mL

## 2018-05-21 MED ORDER — HYDROCODONE-ACETAMINOPHEN 7.5-325 MG PO TABS: 1.0000 | ORAL_TABLET | Freq: Once | ORAL | Status: DC | PRN

## 2018-05-21 MED ORDER — HEPARIN SOD (PORK) LOCK FLUSH 100 UNIT/ML IV SOLN
INTRAVENOUS | Status: AC
  Filled 2018-05-21: qty 5

## 2018-05-21 SURGICAL SUPPLY — 29 items
BAG DECANTER FOR FLEXI CONT (MISCELLANEOUS) ×2 IMPLANT
CHLORAPREP W/TINT 10.5 ML (MISCELLANEOUS) ×2 IMPLANT
CLOTH BEACON ORANGE TIMEOUT ST (SAFETY) ×2 IMPLANT
COVER LIGHT HANDLE STERIS (MISCELLANEOUS) ×4 IMPLANT
COVER WAND RF STERILE (DRAPES) ×2 IMPLANT
DECANTER SPIKE VIAL GLASS SM (MISCELLANEOUS) ×2 IMPLANT
DERMABOND ADVANCED (GAUZE/BANDAGES/DRESSINGS) ×1
DERMABOND ADVANCED .7 DNX12 (GAUZE/BANDAGES/DRESSINGS) ×1 IMPLANT
DRAPE C-ARM FOLDED MOBILE STRL (DRAPES) ×2 IMPLANT
ELECT REM PT RETURN 9FT ADLT (ELECTROSURGICAL) ×2
ELECTRODE REM PT RTRN 9FT ADLT (ELECTROSURGICAL) ×1 IMPLANT
GLOVE BIOGEL M 7.0 STRL (GLOVE) ×2 IMPLANT
GLOVE BIOGEL PI IND STRL 7.0 (GLOVE) ×2 IMPLANT
GLOVE BIOGEL PI INDICATOR 7.0 (GLOVE) ×2
GLOVE SURG SS PI 7.5 STRL IVOR (GLOVE) ×2 IMPLANT
GOWN STRL REUS W/TWL LRG LVL3 (GOWN DISPOSABLE) ×4 IMPLANT
IV NS 500ML (IV SOLUTION) ×1
IV NS 500ML BAXH (IV SOLUTION) ×1 IMPLANT
KIT PORT POWER 8FR ISP MRI (Port) ×2 IMPLANT
KIT TURNOVER KIT A (KITS) ×2 IMPLANT
NEEDLE HYPO 25X1 1.5 SAFETY (NEEDLE) ×2 IMPLANT
PACK MINOR (CUSTOM PROCEDURE TRAY) ×2 IMPLANT
PAD ARMBOARD 7.5X6 YLW CONV (MISCELLANEOUS) ×2 IMPLANT
SET BASIN LINEN APH (SET/KITS/TRAYS/PACK) ×2 IMPLANT
SUT MNCRL AB 4-0 PS2 18 (SUTURE) ×2 IMPLANT
SUT VIC AB 3-0 SH 27 (SUTURE) ×1
SUT VIC AB 3-0 SH 27X BRD (SUTURE) ×1 IMPLANT
SYR 5ML LL (SYRINGE) ×2 IMPLANT
SYR CONTROL 10ML LL (SYRINGE) ×2 IMPLANT

## 2018-05-21 NOTE — Discharge Instructions (Signed)

## 2018-05-21 NOTE — Op Note (Signed)
Patient:  Devin Tran  DOB:  [birth year]  MRN:  [id number]   Preop Diagnosis: Recurrent Hodgkin's lymphoma  Postop Diagnosis: Same  Procedure: Port-A-Cath insertion  Surgeon: Aviva Signs, MD  Anes: MAC  Indications: Patient is a 53 year old black male with a history of recurrent Hodgkin's lymphoma who presents for a Port-A-Cath insertion.  He is about to undergo chemotherapy.  The risks and benefits of the procedure including bleeding, infection, and pneumothorax were fully explained to the patient, who gave informed consent.  Procedure note: The patient was placed in the Trendelenburg position after the left upper chest was prepped and draped using the usual sterile technique with ChloraPrep.  Surgical site confirmation was performed.  1% Xylocaine was used for local anesthesia.  An incision was made through a previous surgical scar just below the left clavicle.  A subcutaneous pocket was formed.  A needle was advanced into the left subclavian vein using the Seldinger technique without difficulty.  A guidewire was then advanced into the right atrium under fluoroscopic guidance.  An introducer and peel-away sheath were placed over the guidewire.  The catheter was inserted through the peel-away sheath and the peel-away sheath was removed.  The catheter was then attached to the port and the port placed in subcutaneous pocket.  Adequate positioning was confirmed by fluoroscopy.  Good backflow of venous blood was noted on aspiration of the port.  The port was flushed with heparin flush.  Subcutaneous layer was reapproximated using a 3-0 Vicryl interrupted suture.  The skin was closed using a 4-0 Monocryl subcuticular suture.  Dermabond was applied.  All tape and needle counts were correct at the end of the procedure.  The patient was transferred to PACU in stable condition.  A chest x-ray will be performed at that time.  Complications: None  EBL: Minimal  Specimen: None

## 2018-05-21 NOTE — Anesthesia Preprocedure Evaluation (Signed)
Anesthesia Evaluation  Patient identified by MRN, date of birth, ID band Patient awake  General Assessment Comment:States last time -no Nausea with MAC  Reviewed: Allergy & Precautions, NPO status , Patient's Chart, lab work & pertinent test results  History of Anesthesia Complications (+) PONV  Airway Mallampati: II  TM Distance: >3 FB Neck ROM: Full    Dental no notable dental hx. (+) Teeth Intact   Pulmonary neg pulmonary ROS,    Pulmonary exam normal breath sounds clear to auscultation       Cardiovascular Exercise Tolerance: Good hypertension, Pt. on medications negative cardio ROS Normal cardiovascular examI Rhythm:Regular Rate:Normal     Neuro/Psych negative neurological ROS  negative psych ROS   GI/Hepatic negative GI ROS, Neg liver ROS,   Endo/Other  negative endocrine ROS  Renal/GU negative Renal ROS  negative genitourinary   Musculoskeletal negative musculoskeletal ROS (+)   Abdominal   Peds negative pediatric ROS (+)  Hematology negative hematology ROS (+)   Anesthesia Other Findings H/o childhood Ca , now with newly Dx'd Lymphoma -here for port   Reproductive/Obstetrics negative OB ROS                             Anesthesia Physical Anesthesia Plan  ASA: III  Anesthesia Plan: General   Post-op Pain Management:    Induction: Intravenous  PONV Risk Score and Plan:   Airway Management Planned: Nasal Cannula and Simple Face Mask  Additional Equipment:   Intra-op Plan:   Post-operative Plan: Extubation in OR  Informed Consent: I have reviewed the patients History and Physical, chart, labs and discussed the procedure including the risks, benefits and alternatives for the proposed anesthesia with the patient or authorized representative who has indicated his/her understanding and acceptance.     Dental advisory given  Plan Discussed with: CRNA  Anesthesia  Plan Comments: (Full PPE use planned  GETA as needed d/w pt -WTP with MAC )        Anesthesia Quick Evaluation

## 2018-05-21 NOTE — Interval H&P Note (Signed)
History and Physical Interval Note:  05/21/2018 7:30 AM  Devin Tran  has presented today for surgery, with the diagnosis of lymphoma.  The various methods of treatment have been discussed with the patient and family. After consideration of risks, benefits and other options for treatment, the patient has consented to  Procedure(s): INSERTION PORT-A-CATH (N/A) as a surgical intervention.  The patient's history has been reviewed, patient examined, no change in status, stable for surgery.  I have reviewed the patient's chart and labs.  Questions were answered to the patient's satisfaction.     Aviva Signs

## 2018-05-21 NOTE — Anesthesia Postprocedure Evaluation (Signed)
Anesthesia Post Note  Patient: Devin Tran  Procedure(s) Performed: INSERTION PORT-A-CATH (attached catheter in left subclavian) (Left Chest)  Patient location during evaluation: PACU Anesthesia Type: General Level of consciousness: awake and alert and oriented Pain management: pain level controlled Vital Signs Assessment: post-procedure vital signs reviewed and stable Respiratory status: spontaneous breathing Cardiovascular status: stable Postop Assessment: no apparent nausea or vomiting Anesthetic complications: no     Last Vitals:  Vitals:   05/21/18 0632 05/21/18 0635  Pulse:  75  Resp:  18  Temp: 36.8 C   SpO2:  96%    Last Pain:  Vitals:   05/21/18 0632  TempSrc: Oral  PainSc: 0-No pain                 Armel Rabbani

## 2018-05-21 NOTE — H&P (Signed)
Devin Tran is an 53 y.o. male.   Chief Complaint: Recurrent Hodgkin's lymphoma HPI: Patient is a 53 year old black male with a history of Hodgkin's lymphoma who is now suffering from a recurrence.  He is about to undergo chemotherapy for treatment.  Oncology is asked for a Port-A-Cath to be placed to start chemotherapy.  Patient denies any pain.  Past Medical History:  Diagnosis Date  . Complication of anesthesia   . Erectile dysfunction   . Hodgkin's lymphoma ([place])   . Hyperlipidemia   . Hypertension   . Infertility   . Low testosterone   . PONV (postoperative nausea and vomiting)     Past Surgical History:  Procedure Laterality Date  . AXILLARY LYMPH NODE BIOPSY Right 05/10/2018   Procedure: AXILLARY LYMPH NODE BIOPSY, RIGHT;  Surgeon: Aviva Signs, MD;  Location: [facility];  Service: General;  Laterality: Right;  . PORTA CATH INSERTION    . PORTA CATH REMOVAL    . SPLENECTOMY Left Over 20 years ago   Due to Hodgkin's lymphoma    Family History  Problem Relation Age of Onset  . Hypertension Mother   . Cancer Father        lung  . Hypertension Father   . Diabetes Father   . COPD Father   . Hypertension Brother   . Stroke Brother    Social History:  reports that he has never smoked. He has never used smokeless tobacco. He reports that he does not drink alcohol or use drugs.  Allergies:  Allergies  Allergen Reactions  . Bleomycin Other (See Comments)    Cramping all over    Medications Prior to Admission  Medication Sig Dispense Refill  . aspirin EC 81 MG tablet Take 81 mg by mouth daily.     . Multiple Vitamins-Minerals (ONE-A-DAY MENS HEALTH FORMULA PO) Take 1 tablet by mouth daily.     Marland Kitchen Propylene Glycol (SYSTANE BALANCE) 0.6 % SOLN Place 1 drop into both eyes 2 (two) times daily as needed (dry eyes).    Marland Kitchen amLODipine (NORVASC) 5 MG tablet TAKE 1 TABLET BY MOUTH EVERY DAY (Patient taking differently: Take 5 mg by mouth daily. TAKE 1 TABLET BY MOUTH EVERY DAY) 90  tablet 1    Results for orders placed or performed in visit on 05/19/18 (from the past 48 hour(s))  Hepatitis B surface antigen     Status: None   Collection Time: 05/19/18 12:34 PM  Result Value Ref Range   Hepatitis B Surface Ag Negative Negative    Comment: (NOTE) Performed At: [facility] [place], [place] [id number] Rush Farmer MD [id number]   Hepatitis B surface antibody     Status: None   Collection Time: 05/19/18 12:34 PM  Result Value Ref Range   Hep B S Ab Non Reactive     Comment: (NOTE)              Non Reactive: Inconsistent with immunity,                            less than 10 mIU/mL              Reactive:     Consistent with immunity,                            greater than 9.9 mIU/mL Performed At: [facility] [address]  [place], [place] [id number] Rush Farmer MD [id number]   Hepatitis C Antibody     Status: None   Collection Time: 05/19/18 12:34 PM  Result Value Ref Range   HCV Ab <0.1 0.0 - 0.9 s/co ratio    Comment: (NOTE)                                  Negative:     < 0.8                             Indeterminate: 0.8 - 0.9                                  Positive:     > 0.9 The CDC recommends that a positive HCV antibody result be followed up with a HCV Nucleic Acid Amplification test (024097). Performed At: [facility] [place], [place] [id number] Rush Farmer MD AS:[id number]   Hepatitis B core antibody, total     Status: None   Collection Time: 05/19/18 12:34 PM  Result Value Ref Range   Hep B Core Total Ab Negative Negative    Comment: (NOTE) Performed At: [facility] [place], [place] [id number] Rush Farmer MD [id number]   Uric acid     Status: None   Collection Time: 05/19/18 12:34 PM  Result Value Ref Range   Uric Acid, Serum 5.9 3.7 - 8.6 mg/dL    Comment: Performed at [facility], [address] [place], [place] [address]   No  results found.  Review of Systems  Constitutional: Positive for malaise/fatigue.  HENT: Negative.   Eyes: Negative.   Respiratory: Negative.   Cardiovascular: Negative.   Gastrointestinal: Negative.   Genitourinary: Negative.   Musculoskeletal: Negative.   Skin: Negative.   Neurological: Negative.   Endo/Heme/Allergies: Negative.   Psychiatric/Behavioral: Negative.     Pulse 75, temperature 98.3 F (36.8 C), temperature source Oral, resp. rate 18, height 6\' 2"  (1.88 m), weight 105.4 kg, SpO2 96 %. Physical Exam  Vitals reviewed. Constitutional: He is oriented to person, place, and time. He appears well-developed and well-nourished. No distress.  HENT:  Head: Normocephalic and atraumatic.  Cardiovascular: Normal rate, regular rhythm and normal heart sounds. Exam reveals no gallop and no friction rub.  No murmur heard. Respiratory: Effort normal and breath sounds normal. No respiratory distress. He has no wheezes. He has no rales.  Neurological: He is alert and oriented to person, place, and time.  Skin: Skin is warm and dry.    Oncology notes reviewed Assessment/Plan Impression: Recurrent Hodgkin's lymphoma Plan: Patient will undergo Port-A-Cath insertion on 05/21/2018.  The risks and benefits of the procedure including bleeding, infection, and pneumothorax were fully explained to the patient, who gave informed consent.  Following Covid-19 protocol, the patient needs to undergo this procedure urgently as he is suffering from a malignancy and is about to undergo chemotherapy.  Patient was seen the day of surgery as per Covid-19 protocol.  Aviva Signs, MD 05/21/2018, 7:14 AM

## 2018-05-21 NOTE — Transfer of Care (Signed)
Immediate Anesthesia Transfer of Care Note  Patient: Devin Tran  Procedure(s) Performed: INSERTION PORT-A-CATH (attached catheter in left subclavian) (Left Chest)  Patient Location: PACU  Anesthesia Type:MAC  Level of Consciousness: awake  Airway & Oxygen Therapy: Patient Spontanous Breathing  Post-op Assessment: Report given to RN  Post vital signs: Reviewed and stable  Last Vitals:  Vitals Value Taken Time  BP    Temp    Pulse    Resp    SpO2      Last Pain:  Vitals:   05/21/18 1610  TempSrc: Oral  PainSc: 0-No pain      Patients Stated Pain Goal: 7 (96/04/54 0981)  Complications: No apparent anesthesia complications

## 2018-05-24 ENCOUNTER — Other Ambulatory Visit: Payer: Self-pay

## 2018-05-24 ENCOUNTER — Encounter (HOSPITAL_COMMUNITY): Payer: Self-pay | Admitting: General Surgery

## 2018-05-24 ENCOUNTER — Other Ambulatory Visit (HOSPITAL_COMMUNITY): Payer: Self-pay

## 2018-05-24 ENCOUNTER — Ambulatory Visit (HOSPITAL_COMMUNITY)
Admission: RE | Admit: 2018-05-24 | Discharge: 2018-05-24 | Disposition: A | Discharge status: Home / Self Care | Payer: BLUE CROSS/BLUE SHIELD | Source: Ambulatory Visit | Attending: Hematology | Admitting: Hematology

## 2018-05-24 DIAGNOSIS — R591 Generalized enlarged lymph nodes: Secondary | ICD-10-CM

## 2018-05-24 DIAGNOSIS — Z8571 Personal history of Hodgkin lymphoma: Secondary | ICD-10-CM

## 2018-05-24 LAB — PULMONARY FUNCTION TEST
DL/VA % pred: 123 %
DL/VA: 5.42 ml/min/mmHg/L
DLCO unc % pred: 101 %
DLCO unc: 32.59 ml/min/mmHg
FEF 25-75 Post: 4 L/sec
FEF 25-75 Pre: 3.14 L/sec
FEF2575-%Change-Post: 27 %
FEF2575-%Pred-Post: 110 %
FEF2575-%Pred-Pre: 86 %
FEV1-%Change-Post: 5 %
FEV1-%Pred-Post: 99 %
FEV1-%Pred-Pre: 94 %
FEV1-Post: 3.72 L
FEV1-Pre: 3.53 L
FEV1FVC-%Change-Post: 7 %
FEV1FVC-%Pred-Pre: 97 %
FEV6-%Change-Post: -2 %
FEV6-%Pred-Post: 97 %
FEV6-%Pred-Pre: 99 %
FEV6-Post: 4.43 L
FEV6-Pre: 4.52 L
FEV6FVC-%Change-Post: 0 %
FEV6FVC-%Pred-Post: 102 %
FEV6FVC-%Pred-Pre: 102 %
FVC-%Change-Post: -1 %
FVC-%Pred-Post: 95 %
FVC-%Pred-Pre: 97 %
FVC-Post: 4.45 L
FVC-Pre: 4.52 L
Post FEV1/FVC ratio: 84 %
Post FEV6/FVC ratio: 99 %
Pre FEV1/FVC ratio: 78 %
Pre FEV6/FVC Ratio: 100 %

## 2018-05-24 MED ORDER — ALBUTEROL SULFATE (2.5 MG/3ML) 0.083% IN NEBU
2.5000 mg | INHALATION_SOLUTION | Freq: Once | RESPIRATORY_TRACT | Status: AC
  Administered 2018-05-24: 2.5 mg via RESPIRATORY_TRACT

## 2018-05-24 NOTE — Progress Notes (Signed)
*  PRELIMINARY RESULTS* Echocardiogram 2D Echocardiogram has been performed.  Devin Tran 05/24/2018, 9:22 AM

## 2018-05-25 ENCOUNTER — Encounter: Payer: Self-pay | Admitting: Family Medicine

## 2018-05-25 ENCOUNTER — Other Ambulatory Visit: Payer: Self-pay

## 2018-05-25 ENCOUNTER — Encounter (HOSPITAL_COMMUNITY): Payer: Self-pay | Admitting: Hematology

## 2018-05-25 ENCOUNTER — Inpatient Hospital Stay (HOSPITAL_BASED_OUTPATIENT_CLINIC_OR_DEPARTMENT_OTHER): Payer: BLUE CROSS/BLUE SHIELD | Admitting: Hematology

## 2018-05-25 ENCOUNTER — Inpatient Hospital Stay (HOSPITAL_COMMUNITY): Payer: BLUE CROSS/BLUE SHIELD

## 2018-05-25 ENCOUNTER — Ambulatory Visit (INDEPENDENT_AMBULATORY_CARE_PROVIDER_SITE_OTHER): Payer: BLUE CROSS/BLUE SHIELD | Admitting: Family Medicine

## 2018-05-25 DIAGNOSIS — C8108 Nodular lymphocyte predominant Hodgkin lymphoma, lymph nodes of multiple sites: Secondary | ICD-10-CM | POA: Diagnosis not present

## 2018-05-25 DIAGNOSIS — I10 Essential (primary) hypertension: Secondary | ICD-10-CM

## 2018-05-25 DIAGNOSIS — R591 Generalized enlarged lymph nodes: Secondary | ICD-10-CM

## 2018-05-25 DIAGNOSIS — Z5111 Encounter for antineoplastic chemotherapy: Secondary | ICD-10-CM | POA: Diagnosis not present

## 2018-05-25 DIAGNOSIS — C8104 Nodular lymphocyte predominant Hodgkin lymphoma, lymph nodes of axilla and upper limb: Secondary | ICD-10-CM

## 2018-05-25 DIAGNOSIS — Z23 Encounter for immunization: Secondary | ICD-10-CM | POA: Diagnosis not present

## 2018-05-25 DIAGNOSIS — Z5112 Encounter for antineoplastic immunotherapy: Secondary | ICD-10-CM | POA: Diagnosis not present

## 2018-05-25 DIAGNOSIS — Z8571 Personal history of Hodgkin lymphoma: Secondary | ICD-10-CM

## 2018-05-25 DIAGNOSIS — R590 Localized enlarged lymph nodes: Secondary | ICD-10-CM | POA: Diagnosis not present

## 2018-05-25 DIAGNOSIS — Z5189 Encounter for other specified aftercare: Secondary | ICD-10-CM | POA: Diagnosis not present

## 2018-05-25 DIAGNOSIS — Z9081 Acquired absence of spleen: Secondary | ICD-10-CM | POA: Insufficient documentation

## 2018-05-25 LAB — SEDIMENTATION RATE: Sed Rate: 0 mm/hr (ref 0–16)

## 2018-05-25 MED ORDER — PNEUMOCOCCAL VAC POLYVALENT 25 MCG/0.5ML IJ INJ
0.5000 mL | INJECTION | Freq: Once | INTRAMUSCULAR | Status: AC
  Administered 2018-05-25: 15:00:00 0.5 mL via INTRAMUSCULAR
  Filled 2018-05-25: qty 0.5

## 2018-05-25 MED ORDER — AMLODIPINE BESYLATE 10 MG PO TABS: ORAL_TABLET | ORAL | 1 refills | Status: DC

## 2018-05-25 MED ORDER — MENINGOCOCCAL A C Y&W-135 CONJ IM INJ
0.5000 mL | INJECTION | Freq: Once | INTRAMUSCULAR | Status: AC
  Administered 2018-05-25: 0.5 mL via INTRAMUSCULAR
  Filled 2018-05-25: qty 0.5

## 2018-05-25 NOTE — Assessment & Plan Note (Addendum)
1.  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  He also underwent splenectomy. -Recurrence in April 1992, treated with CCNU, etoposide and cis-platinum with 6 cycles -Now presents with right axillary lump for the past 2 months.  Night sweats for the last 2 months.  Denies any fevers or weight loss.  - CT of the chest on 05/04/2018 shows 3.2 cm right axillary lymph node along with multiple small lymph nodes.  Several left axillary lymph node measuring up to 2.8 cm.  No mediastinal or hilar adenopathy.  There is supraclavicular adenopathy. - Family history significant for mother with breast cancer and father with lung cancer who was a smoker. -PET/CT scan on 05/12/2018 shows hypermetabolic lymphadenopathy in the bilateral axillary and subpectoral region, right subclavicular region and portacaval space. -Biopsy of the right axillary lymph node on 05/10/2018 shows B-cell lymphoma phenotypically consistent with nodular lymphocyte predominant Hodgkin's lymphoma. -LDH and beta-2 microglobulin were normal.  Hepatitis B and C panel is negative.  Uric acid is normal. -PFTs show FEV1 by FVC ratio is reduced with no significant response following bronchodilators.  Diffusion capacity is normal. -Echocardiogram shows EF of 55 to 60%. - We talked about the pathology report and treatment of his lymphoma.  I recommended R CHOP based chemotherapy as he never received it before.  I will give him Neulasta with it.  We talked about side effects in detail.  He already has a port placed. -He has likely received Adriamycin 200 mg per metered square when he had his first chemotherapy in 1989.  We will track his cumulative doses.  Will consider dexrazoxane once his cumulative dose reaches about 350 mg per metered square.  We will try to limit his Adriamycin total exposure to 550 mg per metered square. -His blood  pressure slightly high today because of anxiety.  If it continues to be high, I would like to optimize it by adding a beta-blocker and ACE inhibitor. -I will also get an opinion from transplant physicians at [facility]. -We will start him on allopurinol 300 mg daily.   2.  Postsplenectomy state: -He received flu shot but denied any booster vaccines. -He will receive Menactra and Pneumovax today.

## 2018-05-25 NOTE — Progress Notes (Signed)
[place], [place] [address]   CLINIC:  Medical Oncology/Hematology  PCP:  [name], MD [place] [address] [phone]   REASON FOR VISIT:  Follow-up for Right axillary adenopathy   BRIEF ONCOLOGIC HISTORY:    Nodular lymphocyte predom Hodgkin lymphoma lymph nodes multiple sites ([place])   05/25/2018 Initial Diagnosis    Nodular lymphocyte predom Hodgkin lymphoma lymph nodes multiple sites ([place])    06/01/2018 -  Chemotherapy    The patient had DOXOrubicin (ADRIAMYCIN) chemo injection 118 mg, 50 mg/m2, Intravenous,  Once, 0 of 6 cycles palonosetron (ALOXI) injection 0.25 mg, 0.25 mg, Intravenous,  Once, 0 of 6 cycles pegfilgrastim-cbqv (UDENYCA) injection 6 mg, 6 mg, Subcutaneous, Once, 0 of 6 cycles vinCRIStine (ONCOVIN) 2 mg in sodium chloride 0.9 % 50 mL chemo infusion, 2 mg, Intravenous,  Once, 0 of 6 cycles riTUXimab (RITUXAN) 900 mg in sodium chloride 0.9 % 250 mL (2.6471 mg/mL) infusion, 375 mg/m2, Intravenous,  Once, 0 of 6 cycles cyclophosphamide (CYTOXAN) 1,760 mg in sodium chloride 0.9 % 250 mL chemo infusion, 750 mg/m2, Intravenous,  Once, 0 of 6 cycles  for chemotherapy treatment.       CANCER STAGING: Cancer Staging No matching staging information was found for the patient.   INTERVAL HISTORY:  Mr. Umar 53 y.o. male returns for routine follow-up. He is here today alone. He states that he has experienced itching in his groin area since his last visit. He states that he did well with the port placement and has been feeling ok since his last visit.Denies any nausea, vomiting, or diarrhea. Denies any new pains. Had not noticed any recent bleeding such as epistaxis, hematuria or hematochezia. Denies recent chest pain on exertion, shortness of breath on minimal exertion, pre-syncopal episodes, or palpitations. Denies any numbness or tingling in hands or feet. Denies any recent fevers, infections, or recent  hospitalizations. Patient reports appetite at 75% and energy level at 100%.  REVIEW OF SYSTEMS:  Review of Systems  Constitutional: Positive for diaphoresis.  All other systems reviewed and are negative.    PAST MEDICAL/SURGICAL HISTORY:  Past Medical History:  Diagnosis Date  . Complication of anesthesia   . Erectile dysfunction   . Hodgkin's lymphoma ([place])   . Hyperlipidemia   . Hypertension   . Infertility   . Low testosterone   . PONV (postoperative nausea and vomiting)    Past Surgical History:  Procedure Laterality Date  . AXILLARY LYMPH NODE BIOPSY Right 05/10/2018   Procedure: AXILLARY LYMPH NODE BIOPSY, RIGHT;  Surgeon: Aviva Signs, MD;  Location: [facility];  Service: General;  Laterality: Right;  . PORTA CATH INSERTION    . PORTA CATH REMOVAL    . PORTACATH PLACEMENT Left 05/21/2018   Procedure: INSERTION PORT-A-CATH (attached catheter in left subclavian);  Surgeon: Aviva Signs, MD;  Location: [facility];  Service: General;  Laterality: Left;  . SPLENECTOMY Left Over 20 years ago   Due to Hodgkin's lymphoma     SOCIAL HISTORY:  Social History   Socioeconomic History  . Marital status: Single    Spouse name: Not on file  . Number of children: Not on file  . Years of education: Not on file  . Highest education level: Not on file  Occupational History  . Occupation: Drive Chief Operating Officer: [employer]  . Financial resource strain: Not on file  . Food insecurity:  Worry: Not on file    Inability: Not on file  . Transportation needs:    Medical: Not on file    Non-medical: Not on file  Tobacco Use  . Smoking status: Never Smoker  . Smokeless tobacco: Never Used  Substance and Sexual Activity  . Alcohol use: No  . Drug use: No  . Sexual activity: Yes  Lifestyle  . Physical activity:    Days per week: Not on file    Minutes per session: Not on file  . Stress: Not on file  Relationships  . Social connections:    Talks on  phone: Not on file    Gets together: Not on file    Attends religious service: Not on file    Active member of club or organization: Not on file    Attends meetings of clubs or organizations: Not on file    Relationship status: Not on file  . Intimate partner violence:    Fear of current or ex partner: Not on file    Emotionally abused: Not on file    Physically abused: Not on file    Forced sexual activity: Not on file  Other Topics Concern  . Not on file  Social History Narrative  . Not on file    FAMILY HISTORY:  Family History  Problem Relation Age of Onset  . Hypertension Mother   . Cancer Father        lung  . Hypertension Father   . Diabetes Father   . COPD Father   . Hypertension Brother   . Stroke Brother     CURRENT MEDICATIONS:  Outpatient Encounter Medications as of 05/25/2018  Medication Sig  . amLODipine (NORVASC) 10 MG tablet TAKE 1 TABLET BY MOUTH EVERY DAY (Patient taking differently: Take 5 mg by mouth daily. TAKE 1 TABLET BY MOUTH EVERY DAY)  . aspirin EC 81 MG tablet Take 81 mg by mouth daily.   . Multiple Vitamins-Minerals (ONE-A-DAY MENS HEALTH FORMULA PO) Take 1 tablet by mouth daily.   Marland Kitchen Propylene Glycol (SYSTANE BALANCE) 0.6 % SOLN Place 1 drop into both eyes 2 (two) times daily as needed (dry eyes).  Marland Kitchen HYDROcodone-acetaminophen (NORCO) 5-325 MG tablet Take 1 tablet by mouth every 4 (four) hours as needed for moderate pain. (Patient not taking: Reported on 05/25/2018)  . [EXPIRED] meningococcal polysaccharide (MENACTRA) injection 0.5 mL   . [EXPIRED] pneumococcal 23 valent vaccine (PNU-IMMUNE) injection 0.5 mL    No facility-administered encounter medications on file as of 05/25/2018.     ALLERGIES:  Allergies  Allergen Reactions  . Bleomycin Other (See Comments)    Cramping all over     PHYSICAL EXAM:  ECOG Performance status: 0  Vitals:   05/25/18 1340  BP: (!) 166/109  Pulse: 94  Resp: 18  Temp: 99.1 F (37.3 C)  SpO2: 97%    Filed Weights   05/25/18 1340  Weight: 229 lb 14.4 oz (104.3 kg)    Physical Exam Vitals signs reviewed.  Constitutional:      Appearance: Normal appearance.  Cardiovascular:     Rate and Rhythm: Normal rate and regular rhythm.     Heart sounds: Normal heart sounds.  Pulmonary:     Effort: Pulmonary effort is normal.     Breath sounds: Normal breath sounds.  Abdominal:     General: There is no distension.     Palpations: Abdomen is soft. There is no mass.  Musculoskeletal:  General: No swelling.  Lymphadenopathy:     Cervical: Cervical adenopathy present.  Skin:    General: Skin is warm.  Neurological:     General: No focal deficit present.     Mental Status: He is alert and oriented to person, place, and time.  Psychiatric:        Mood and Affect: Mood normal.        Behavior: Behavior normal.      LABORATORY DATA:  I have reviewed the labs as listed.  CBC    Component Value Date/Time   WBC 7.3 05/03/2018 1553   WBC 7.0 05/20/2013 1121   RBC 6.09 (H) 05/03/2018 1553   RBC 5.8 05/20/2013 1121   HGB 16.1 05/03/2018 1553   HCT 49.8 05/03/2018 1553   PLT 280 05/03/2018 1553   MCV 82 05/03/2018 1553   MCH 26.4 (L) 05/03/2018 1553   MCH 25.5 (A) 05/20/2013 1121   MCHC 32.3 05/03/2018 1553   MCHC 29.9 (A) 05/20/2013 1121   RDW 14.4 05/03/2018 1553   LYMPHSABS 4.5 (H) 05/03/2018 1553   EOSABS 0.2 05/03/2018 1553   BASOSABS 0.1 05/03/2018 1553   CMP Latest Ref Rng & Units 05/06/2018 05/03/2018 01/12/2018  Glucose 65 - 99 mg/dL - 97 104(H)  BUN 6 - 24 mg/dL - 16 14  Creatinine 0.76 - 1.27 mg/dL - 1.18 1.13  Sodium 134 - 144 mmol/L - 144 140  Potassium 3.5 - 5.2 mmol/L - 3.8 4.7  Chloride 96 - 106 mmol/L - 101 100  CO2 20 - 29 mmol/L - 23 24  Calcium 8.7 - 10.2 mg/dL - 10.1 10.2  Total Protein 6.5 - 8.1 g/dL 8.2(H) 7.4 7.0  Total Bilirubin 0.3 - 1.2 mg/dL 0.9 0.4 0.5  Alkaline Phos 38 - 126 U/L 70 78 78  AST 15 - 41 U/L 24 18 22   ALT 0 - 44 U/L 32 25 30        DIAGNOSTIC IMAGING:  I have independently reviewed the scans and discussed with the patient.   I have reviewed Venita Lick LPN's note and agree with the documentation.  I personally performed a face-to-face visit, made revisions and my assessment and plan is as follows.    ASSESSMENT & PLAN:   Nodular lymphocyte predom Hodgkin lymphoma lymph nodes multiple sites ([place]) 1.  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  He also underwent splenectomy. -Recurrence in April 1992, treated with CCNU, etoposide and cis-platinum with 6 cycles -Now presents with right axillary lump for the past 2 months.  Night sweats for the last 2 months.  Denies any fevers or weight loss.  - CT of the chest on 05/04/2018 shows 3.2 cm right axillary lymph node along with multiple small lymph nodes.  Several left axillary lymph node measuring up to 2.8 cm.  No mediastinal or hilar adenopathy.  There is supraclavicular adenopathy. - Family history significant for mother with breast cancer and father with lung cancer who was a smoker. -PET/CT scan on 05/12/2018 shows hypermetabolic lymphadenopathy in the bilateral axillary and subpectoral region, right subclavicular region and portacaval space. -Biopsy of the right axillary lymph node on 05/10/2018 shows B-cell lymphoma phenotypically consistent with nodular lymphocyte predominant Hodgkin's lymphoma. -LDH and beta-2 microglobulin were normal.  Hepatitis B and C panel is negative.  Uric acid is normal. -PFTs show FEV1 by FVC ratio is reduced with no significant response following bronchodilators.  Diffusion capacity is normal. -Echocardiogram shows EF of 55 to 60%. - We talked about the pathology report and treatment of his lymphoma.  I recommended R CHOP based chemotherapy as he never received it before.  I will give him Neulasta with it.  We talked  about side effects in detail.  He already has a port placed. -He has likely received Adriamycin 200 mg per metered square when he had his first chemotherapy in 1989.  We will track his cumulative doses.  Will consider dexrazoxane once his cumulative dose reaches about 350 mg per metered square.  We will try to limit his Adriamycin total exposure to 550 mg per metered square. -His blood pressure slightly high today because of anxiety.  If it continues to be high, I would like to optimize it by adding a beta-blocker and ACE inhibitor. -I will also get an opinion from transplant physicians at [facility]. -We will start him on allopurinol 300 mg daily.   2.  Postsplenectomy state: -He received flu shot but denied any booster vaccines. -He will receive Menactra and Pneumovax today.   Total time spent is 40 minutes with more than 50% of the time spent face-to-face discussing new diagnosis, treatment options, side effects and coordination of care.  Orders placed this encounter:  No orders of the defined types were placed in this encounter.     Derek Jack, MD [place] [phone]

## 2018-05-25 NOTE — Patient Instructions (Addendum)
Devin Tran at [facility] Discharge Instructions  You were seen today by Dr. Delton Coombes. He went over your recent lab and test results. He will see you back in 1 week for labs and follow up.   Thank you for choosing [facility] at [facility] to provide your oncology and hematology care.  To afford each patient quality time with our provider, please arrive at least 15 minutes before your scheduled appointment time.   If you have a lab appointment with the [facility] please come in thru the  Main Entrance and check in at the main information desk  You need to re-schedule your appointment should you arrive 10 or more minutes late.  We strive to give you quality time with our providers, and arriving late affects you and other patients whose appointments are after yours.  Also, if you no show three or more times for appointments you may be dismissed from the clinic at the providers discretion.     Again, thank you for choosing [facility].  Our hope is that these requests will decrease the amount of time that you wait before being seen by our physicians.       _____________________________________________________________  Should you have questions after your visit to [facility], please contact our office at [phone] between the hours of 8:00 a.m. and 4:30 p.m.  Voicemails left after 4:00 p.m. will not be returned until the following business day.  For prescription refill requests, have your pharmacy contact our office and allow 72 hours.    Cancer Center Support Programs:   > Cancer Support Group  2nd Tuesday of the month 1pm-2pm, Journey Room

## 2018-05-25 NOTE — Progress Notes (Signed)
    Subjective:    Patient ID: Devin Tran, male    DOB: May 25, 1965, 53 y.o.   MRN: [id number]  CC: Hypertension   HPI: Devin Tran is a 53 y.o. male presenting for Hypertension    Follow-up of hypertension. Patient has no history of headache chest pain or shortness of breath or recent cough. Patient also denies symptoms of TIA such as numbness weakness lateralizing. Patient checks  blood pressure at home and has not had any elevated readings recently. Patient denies side effects from his medication. States taking it regularly.  Pt. Recently was found to have a recurrence of his lymphoma. Wil be seen by Oncology today for treatment options.  Depression screen [facility] 2/9 01/12/2018 07/13/2017 11/11/2016 05/12/2016 04/02/2015  Decreased Interest 0 0 0 0 0  Down, Depressed, Hopeless 0 0 0 0 0  PHQ - 2 Score 0 0 0 0 0     Relevant past medical, surgical, family and social history reviewed and updated as indicated.  Interim medical history since our last visit reviewed. Allergies and medications reviewed and updated.  ROS:  Review of Systems  Constitutional: Negative.   HENT: Negative.   Eyes: Negative for visual disturbance.  Respiratory: Negative for cough and shortness of breath.   Cardiovascular: Negative for chest pain and leg swelling.  Gastrointestinal: Negative for abdominal pain, diarrhea, nausea and vomiting.  Genitourinary: Negative for difficulty urinating.  Musculoskeletal: Negative for arthralgias and myalgias.  Skin: Negative for rash.  Neurological: Negative for headaches.  Psychiatric/Behavioral: Negative for sleep disturbance.     Social History   Tobacco Use  Smoking Status Never Smoker  Smokeless Tobacco Never Used       Objective:     Wt Readings from Last 3 Encounters:  05/25/18 229 lb 14.4 oz (104.3 kg)  05/21/18 232 lb 6.4 oz (105.4 kg)  05/19/18 232 lb 6.4 oz (105.4 kg)     Exam deferred. Pt. Harboring due to COVID 19. Phone visit performed.   Assessment & Plan:  No diagnosis found.  Meds ordered this encounter  Medications  . amLODipine (NORVASC) 10 MG tablet    Sig: TAKE 1 TABLET BY MOUTH EVERY DAY    Dispense:  90 tablet    Refill:  1    No orders of the defined types were placed in this encounter.     There are no diagnoses linked to this encounter.  Virtual Visit via telephone Note  I discussed the limitations, risks, security and privacy concerns of performing an evaluation and management service by telephone and the availability of in person appointments. The patient was identified with two identifiers. Pt.expressed understanding and agreed to proceed. Pt. Is at home. Dr. Livia Snellen is in his home office.  Follow Up Instructions:   I discussed the assessment and treatment plan with the patient. The patient was provided an opportunity to ask questions and all were answered. The patient agreed with the plan and demonstrated an understanding of the instructions.   The patient was advised to call back or seek an in-person evaluation if the symptoms worsen or if the condition fails to improve as anticipated.  Visit started: 8:55 Call ended:  9:10 Total minutes including chart review and phone contact time: 26   Follow up plan: Return in about 6 months (around 11/24/2018).  Devin Fraise, MD [facility] 05/25/2018, 2:34 PM

## 2018-05-25 NOTE — Progress Notes (Signed)
Devin Tran tolerated Pneumococcal and menactra vaccines well without complaints or incident.Pt discharged self ambulatory in satisfactory condition

## 2018-05-25 NOTE — Progress Notes (Signed)
START ON PATHWAY REGIMEN - Lymphoma and CLL     A cycle is every 21 days:     Prednisone      Rituximab      Cyclophosphamide      Doxorubicin      Vincristine   **Always confirm dose/schedule in your pharmacy ordering system**  Patient Characteristics: Nodular Lymphocyte Predominant Hodgkin Lymphoma, First Line, Stage III / IV Disease Type: Not Applicable Disease Type: Not Applicable Disease Type: Nodular Lymphocyte Predominant Hodgkin Lymphoma Line of therapy: First Line [place] Stage: IIIB Intent of Therapy: Curative Intent, Discussed with Patient

## 2018-05-26 ENCOUNTER — Telehealth: Payer: Self-pay

## 2018-05-26 LAB — HIV ANTIBODY (ROUTINE TESTING W REFLEX): HIV Screen 4th Generation wRfx: NONREACTIVE

## 2018-05-26 NOTE — Telephone Encounter (Signed)
Neulasta Onpro Patient Outreach Note  Patient was contacted on 05/26/2018 in regards to switching G-CSF therapy to Neulasta Onpro ([email or web address]). Patient was educated on the purpose of this proposed change in therapy due to COVID-19 pandemic. Patient was educated about Neulasta Onpro on-body injector and patient will be provided with an educational video while in infusion on their next scheduled date if changed to Neulasta Onpro.   [x]  Patient agrees to change in therapy. Begin process to change to Neulasta Onpro therapy.  []  Patient does not agree to change in therapy. No change to Neulasta Onpro at this time.    Thank You,  Kerri Perches  05/26/2018 2:28 PM

## 2018-05-27 MED ORDER — LIDOCAINE-PRILOCAINE 2.5-2.5 % EX CREA: TOPICAL_CREAM | CUTANEOUS | 3 refills | Status: DC

## 2018-05-27 MED ORDER — PROCHLORPERAZINE MALEATE 10 MG PO TABS: 10.0000 mg | ORAL_TABLET | Freq: Four times a day (QID) | ORAL | 6 refills | Status: DC | PRN

## 2018-05-27 MED ORDER — PREDNISONE 20 MG PO TABS: 100.0000 mg | ORAL_TABLET | Freq: Every day | ORAL | 0 refills | Status: DC

## 2018-05-27 MED ORDER — ALLOPURINOL 300 MG PO TABS: 300.0000 mg | ORAL_TABLET | Freq: Every day | ORAL | 0 refills | Status: DC

## 2018-05-27 NOTE — Patient Instructions (Addendum)
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 You will be treated with a chemotherapy/immunotherapy regimen called RCHOP (see below) every 21 days.  The intent of treatment is to cure.  You will see the doctor regularly throughout treatment.  We monitor your lab work prior to every treatment. The doctor monitors your response to treatment by the way you are feeling, your blood work, and scans periodically.  There will be wait times while you are here for treatment.  It will take about 30 minutes to 1 hour for your lab work to result.  Then there will be wait times while pharmacy mixes your medications.   You will receive pre-medications prior to chemotherapy.  These include:  Tylenol and Benadryl:  Given to help prevent any type of reaction to chemotherapy.    Medications you will receive in the clinic prior to your chemotherapy medications:  Aloxi:  ALOXI is a medicine called an "antiemetic."   ALOXI is used in adults to help prevent the  nausea and vomiting that happens with certain anti-cancer medicines (chemotherapy).  Aloxi is a long acting medication, and will remain in your system for 24-36 hours.   Emend:  This is an anti-nausea medication that is used with Aloxi to help prevent nausea and vomiting caused by chemotherapy.  Dexamethasone:  This is a steroid given prior to chemotherapy to help prevent allergic reactions; it may also help prevent and control nausea and diarrhea.    You will get Neulasta after every treatment of chemo:  Neulasta - this medication is not chemotherapy but is being given because you have had chemo. It is usually given 24-48 hours after the completion of chemotherapy. This medication works by boosting your bone marrow's supply of white blood cells. White blood cells are what protect our bodies against infection. The medication is given in the form of a  subcutaneous injection. It is given in the fatty tissue of your abdomen, or in the skin on the back of your arm . It is a short needle. The major side effect of this medication is bone or muscle pain. The drug of choice to relieve or lessen the pain is Aleve or Ibuprofen. If a physician has ever told you not to take Aleve or Ibuprofen - then don't take it. You should then take Tylenol/acetaminophen. Take either medication as the bottle directs you to.  The level of pain you experience as a result of this injection can range from none, to mild or moderate, or severe. Please let us know if you develop moderate or severe bone pain.   You can take Claritin 10 mg over the counter for a few days after receiving Neulasta to help with the bone aches and pains.     R-CHOP is the acronym for the combination of chemotherapy drugs and other drugs you will receive for your treatment.  The drugs this regimen includes are:  Rituxan (rituximab); Cytoxan (cyclophosphamide); Oncovin (vincristine); Adriamycin (doxorubicin HCl); and prednisone (not a chemo drug).  Cyclophosphamide (Generic Name) Other Names: Cytoxan, Neosar  About This Drug  Cyclophosphamide is a drug used to treat cancer. It is given in the vein (IV) or by mouth. This drug takes 30 minutes to infuse.  Possible Side Effects (More Common)  . Nausea and throwing up (vomiting). These symptoms may happen within a few hours after your treatment and may last up to 72 hours. Medicines are available to  stop or lessen these side effects.  . Bone marrow depression. This is a decrease in the number of white blood cells, red blood cells, and platelets. This may raise your risk of infection, make you tired and weak (fatigue), and raise your risk of bleeding.  . Hair loss: You may notice hair getting thin. Some patients lose their hair. Hair loss is often complete scalp hair loss and can involve loss of eyebrows, eyelashes, and pubic hair. You may notice this a  few days or weeks after treatment has started. Most often hair loss is temporary; your hair should grow back when treatment is done.  . Decreased appetite (decreased hunger)  . Blurred vision  . Soreness of the mouth and throat. You may have red areas, white patches, or sores that hurt.  . Effects on the bladder. This drug may cause irritation and bleeding in the bladder. You may have blood in your urine. To help stop this, you will get extra fluids to help you pass more urine. You may get a drug called mesna, which helps to decrease irritation and bleeding. You may also get a medicine to help you pass more urine. You may have a catheter (tube) placed in your bladder so that your bladder will be washed with this drug.  Possible Side Effects (Less Common)  . Darkening of the skin or nails  . Metallic taste in the mouth  . Changes in lung tissue may happen with large amounts of this drug. These changes may not last forever, and your lung tissue may go back to normal. Sometimes these changes may not be seen for many years. You may get a cough or have trouble catching your breath.    Allergic Reactions  Serious allergic reactions including anaphylaxis are rare. While you are getting this drug in your vein (IV), tell your nurse right away if you have any of these symptoms of an allergic reaction:  . Trouble catching your breath  . Feeling like your tongue or throat are swelling  . Feeling your heart beat quickly or in a not normal way (palpitations)  . Feeling dizzy or lightheaded  . Flushing, itching, rash, and/or hives  Treating Side Effects  . Drink 6-8 cups of fluids each day unless your doctor has told you to limit your fluid intake due to some other health problem. A cup is 8 ounces of fluid. If you throw up or have loose bowel movements you should drink more fluids so that you do not become dehydrated (lack water in the body due to losing too much fluid).  . Ask your doctor or  nurse about medicine that is available to help stop or lessen nausea or throwing up.  . Mouth care is very important. Your mouth care should consist of routine, gentle cleaning of your teeth or dentures and rinsing your mouth with a mixture of 1/2 teaspoon of salt in 8 ounces of water or  teaspoon of baking soda in 8 ounces of water. This should be done at least after each meal and at bedtime.  . If you have mouth sores, avoid mouthwash that has alcohol. Also avoid alcohol and smoking because they can bother your mouth and throat.  . Talk with your nurse about getting a wig before you lose your hair. Also, call the [place] at 800-ACS-2345 to find out information about the " Look Good.Marland KitchenMarland KitchenFeel Better" program close to where you live. It is a free program where women undergoing chemotherapy learn  about wigs, turbans and scarves as well as makeup techniques and skin and nail care.  Important Information  . Whenever you tell a doctor or nurse your health history, always tell them that you have received cyclophosphamide in the past.  . If you take this drug by mouth swallow the medicine whole. Do not chew, break or crush it.  . You can take the medicine with or without food. If you have nausea, take it with food. Do not take the pills at bedtime.  Food and Drug Interactions  There are no known interactions of cyclophosphamide with food. This drug may interact with other medicines. Tell your doctor and pharmacist about all the medicines and dietary supplements (vitamins, minerals, herbs and others) that you are taking at this time. The safety and use of dietary supplements and alternative diets are often not known. Using these might affect your cancer or interfere with your treatment. Until more is known, you should not use dietary supplements or alternative diets without your cancer doctor's help.  When to Call the Doctor  Call your doctor or nurse right away if you have any of these  symptoms:  . Fever of 100.5 F (38 C) or higher  . Chills  . Bleeding or bruising that is not normal  . Blurred vision or other changes in eyesight  . Pain when passing urine; blood in urine  . Pain in your lower back or side  . Wheezing or trouble breathing  . Swelling of legs, ankles, or feet  . Feeling dizzy or lightheaded  . Feeling confused or agitated  . Signs of liver problems: dark urine, pale bowel movements, bad stomach pain, feeling very tired and weak, unusual itching, or yellowing of the eyes or skin  . Unusual thirst or passing urine often  . Nausea that stops you from eating or drinking  . Throwing up more than 3 times a day  Call your doctor or nurse as soon as possible if any of these symptoms happen:  . Pain in your mouth or throat that makes it hard to eat or drink  . Nausea not relieved by prescribed medicines  Sexual Problems and Reproductive Concerns  . Infertility warning: Sexual problems and reproduction concerns may happen. In both men and women, this drug may affect your ability to have children. This cannot be determined before your treatment. Talk with your doctor or nurse if you plan to have children. Ask for information on sperm or egg banking.  . In men, this drug may interfere with your ability to make sperm, but it should not change your ability to have sexual relations.  . In women, menstrual bleeding may become irregular or stop while you are getting this drug. Do not assume that you cannot become pregnant if you do not have a menstrual period.  . Women may go through signs of menopause (change of life) like vaginal dryness or itching. Vaginal lubricants can be used to lessen vaginal dryness, itching, and pain during sexual relations.  . Genetic counseling is available for you to talk about the effects of this drug therapy on future pregnancies. Also, a genetic counselor can look at the possible risk of problems in the unborn baby due  to this medicine if an exposure happens during pregnancy.  . Pregnancy warning: This drug may have harmful effects on the unborn child, so effective methods of birth control should be used during your cancer treatment.  . Breast feeding warning: Women should not breast  feed during treatment because this drug could enter the breast milk and badly harm a breast feeding baby.    Doxorubicin (Generic Name) Other Names: Adriamycin, hydroxyl daunorubicin  About This Drug  Doxorubicin is a drug used to treat cancer. This drug is given in the vein (IV).  This is an IV push that will take about 5-10 minutes.  Possible Side Effects (More Common)  . Bone marrow depression. This is a decrease in the number of white blood cells, red blood cells, and platelets. This may raise your risk of infection, make you tired and weak (fatigue), and raise your risk of bleeding.  . Hair loss: Hair loss is often complete scalp hair loss and can involve loss of eyebrows, eyelashes, and pubic hair. You may notice this a few days or weeks after treatment has started. Most often hair loss is temporary; your hair should grow back when treatment is done.  . Nausea and throwing up (vomiting). These symptoms may happen within a few hours after your treatment and may last up to 24 hours. Medicines are available to stop or lessen these side effects.  . Soreness of the mouth and throat. You may have red areas, white patches, or sores that hurt.  . Change in the color of your urine to pink or red. This color change will go away in one to two days.  . Effects on the heart: This drug can weaken the heart and lower heart function. Your heart function will be checked as needed. You may have trouble catching your breath, mainly during activities.  You may also have trouble breathing while lying down, and have swelling in your ankles.  . Sensitivity to light (photosensitivity). Photosensitivity means that you may become more  sensitive to the effects of the sun, sun lamps, and tanning beds. Your eyes may water more, mostly in bright light.  . Metallic taste in the mouth: This may change the taste of food and drinks  . Decreased appetite (decreased hunger)  . Darkening of the skin or nails  . Weakness that interferes with your daily activities   Possible Side Effects (Less Common)  . Skin and tissue irritation may involve redness, pain, warmth, or swelling at the IV site. This happens if the drug leaks out of the vein and into nearby tissue.  . Changes in your liver function. Your doctor will check your liver function as needed.  . This drug may cause an increased risk of developing a second cancer  Allergic Reaction  Serious allergic reactions, including anaphylaxis are rare. While you are getting this drug in your vein (IV), tell your nurse right away if you have any of these symptoms of an allergic reaction:  . Trouble catching your breath  . Feeling like your tongue or throat are swelling  . Feeling your heart beat quickly or in a not normal way (palpitations)  . Feeling dizzy or lightheaded  . Flushing, itching, rash, and/or hives  Treating Side Effects  . Drink 6-8 cups of fluids every day unless your doctor has told you to limit your fluid intake due to some other health problem. A cup is 8 ounces of fluid. If you vomit or have diarrhea, you should drink more fluids so that you do not become dehydrated (lack water in the body due to losing too much fluid).  . Ask your doctor or nurse about medicine that is available to help stop or lessen nausea, throwing up, and/or loose bowel movements  .  Wear dark sunglasses and use sunscreen with SPF 30 or higher when you are outdoors even for a short time. Cover up when you are out in the sun. Wear wide-brimmed hats, long-sleeved shirts, and pants. Keep your neck, chest, and back covered.  . Mouth care is very important. Your mouth care should consist  of routine, gentle cleaning of your teeth or dentures and rinsing your mouth with a mixture of 1/2 teaspoon of salt in 8 ounces of water or  teaspoon of baking soda in 8 ounces of water. This should be done at least after each meal and at bedtime.  . If you have mouth sores, avoid mouthwash that has alcohol. Avoid alcohol and smoking because they can bother your mouth and throat.  . Talk with your nurse about getting a wig before you lose your hair. Also, call the [place] at 800-ACS-2345 to find out information about the "Look Good, Feel Better" program close to where you live. It is a free program where women getting chemotherapy can learn about wigs, turbans and scarves as well as makeup techniques and skin and nail care.  . While you are getting this drug, please tell your nurse right away if you have any pain, redness, or swelling at the site of the IV infusion.  Food and Drug Interactions  There are no known interactions of doxorubicin with food. This drug may interact with other medicines. Tell your doctor and pharmacist about all the medicines and dietary supplements (vitamins, minerals, herbs and others) that you are taking at this time. The safety and use of dietary supplements and alternative diets are often not known. Using these might affect your cancer or interfere with your treatment. Until more is known, you should not use dietary supplements or alternative diets without your cancer doctor's help.  When to Call the Doctor  Call your doctor or nurse right away if you have any of these symptoms:  . Fever of 100.5 F (38 C) or above  . Chills  . Easy bruising or bleeding  . Wheezing or trouble breathing  . Rash or itching  . Feeling dizzy or lightheaded  . Feeling that your heart is beating in a fast or not normal way (palpitations)  . Loose bowel movements (diarrhea) more than 4 times a day or diarrhea with weakness or feeling lightheaded  . Nausea that  stops you from eating or drinking  . Throwing up more than 3 times a day  . Signs of liver problems: dark urine, pale bowel movements, bad stomach pain, feeling very tired and weak, unusual itching, or yellowing of the eyes or skin,  . During the IV infusion, if you have pain, redness, or swelling at the site of the IV infusion, please tell your nurse right away  Call your doctor or nurse as soon as possible if any of these symptoms happen:  . Decreased urine  . Pain in your mouth or throat that makes it hard to eat or drink  . Nausea and throwing up that is not relieved by prescribed medicines  . Rash that is not relieved by prescribed medicines  . Swelling of legs, ankles, or feet  . Weight gain of 5 pounds in one week (fluid retention)  . Lasting loss of appetite or rapid weight loss of five pounds in a week  . Fatigue that interferes with your daily activities  . Extreme weakness that interferes with normal activities  Sexual Problems and Reproduction Concerns  .  Infertility warning: Sexual problems and reproduction concerns may happen. In both men and women, this drug may affect your ability to have children. This cannot be determined before your treatment. Talk with your doctor or nurse if you plan to have children. Ask for information on sperm or egg banking.  . In men, this drug may interfere with your ability to make sperm, but it should not change your ability to have sexual relations.  . In women, menstrual bleeding may become irregular or stop while you are getting this drug. Do not assume that you cannot become pregnant if you do not have a menstrual period.  . Women may go through signs of menopause (change of life) like vaginal dryness or itching. Vaginal lubricants can be used to lessen vaginal dryness, itching, and pain during sexual relations.  . Genetic counseling is available for you to talk about the effects of this drug therapy on future pregnancies. Also,  a genetic counselor can look at the possible risk of problems in the unborn baby due to this medicine if an exposure happens during pregnancy.  . Pregnancy warning: This drug may have harmful effects on the unborn baby, so effective methods of birth control should be used by you and your partner during your cancer treatment and for at least 6 months after treatment  . Breast feeding warning: Women should not breast feed during treatment because this drug could enter the breast milk and badly harm a breast feeding baby.   Vincristine sulfate (Generic Name) Other Names: Oncovin, Vincasar, VCR  About This Drug  Vincristine is a drug used to treat cancer. It is given in the vein (IV).  This drug will take 10 minutes to infuse.  Possible Side Effects (More Common)  . Constipation (not able to move bowels)  . Hair loss. You may notice hair getting thin. Some patients lose their hair. Your hair often grows back when treatment is done. Most often hair loss is temporary; your hair should grow back when treatment is done.  . Effects on the nerves are called peripheral neuropathy. You may feel numbness, tingling, or pain in your hands and feet. It may be hard for you to button your clothes, open jars, or walk as usual. The effect on the nerves may get worse with more doses of the drug. These effects get better in some people after the drug is stopped but it does not get better in all people.   Possible Side Effects (Less Common) . Soreness of the mouth and throat. You may have red areas, white patches, or sores that hurt.  . Swelling of your legs, ankles, and/or feet  . Skin and tissue irritation may involve redness, pain, warmth, or swelling at the IV site. This happens if the drug leaks out of the vein and into nearby tissue.  . Blood pressure changes. Some patients have low blood pressure and some patients have high blood pressure.  . Jaw pain  . Hoarseness  . Blurred or double vision  or other changes in eyesight may happen but are a rare side effect.  . Feeling dizzy or lightheaded  . Drooping eyelids are a rare side effect.  . Depression  . Rash  . Bone marrow depression. This is a decrease in the number of white blood cells, red blood cells, and platelets. This may raise your risk of infection, make you tired and weak (fatigue), and raise your risk of bleeding.  . Chest pain or symptoms of a heart  attack. Most heart attacks involve pain in the center of the chest that lasts more than a few minutes. The pain may go away and come back or it can be constant. It can feel like pressure, squeezing, fullness, or pain. Sometimes pain is felt in one or both arms, the back, neck, jaw, or stomach. If any of these symptoms last 2 minutes, call 911.  . Trouble sleeping  . Lack of appetite; weight loss  Allergic Reactions  Serious allergic reactions including anaphylaxis are rare. While you are getting this drug in your vein (IV), tell your nurse right away if you have any of these symptoms of an allergic reaction:  . Trouble catching your breath  . Feeling like your tongue or throat are swelling  . Feeling your heart beat quickly or in a not normal way (palpitations)  . Feeling dizzy or lightheaded  . Flushing, itching, rash, and/or hives  Treating Side Effects  . While you are getting this drug in your IV, please tell your nurse right away if you have any pain, redness, or swelling at the site of the IV infusion.  . Ask your doctor or nurse how to prevent or lessen constipation. Constipation can become a serious side effect. If you are constipated, check with your doctor or nurse before you use enemas, laxatives, or suppositories.  . Drink 6-8 cups of fluids each day unless your doctor has told you to limit your fluid intake due to some other health problem. A cup is 8 ounces of fluid. If you throw up or have loose bowel movements, you should drink more fluids so  that you do not become dehydrated (lack water in the body from losing too much fluid).  . Mouth care is very important. Your mouth care should consist of gently cleaning your teeth or dentures and rinsing your mouth with a mixture of  teaspoon salt in 8 ounces of water or  teaspoon sodium bicarbonate (baking soda) in 8 ounces of water. This should be done at least after each meal and at bedtime.  . If you have mouth sores, avoid mouthwash that has alcohol. Also avoid alcohol and smoking because they can bother your mouth and throat.  . If you get a rash do not put anything on it unless your doctor or nurse says you may. Keep the area around the rash clean and dry. Ask your doctor for medicine if your rash bothers you.  . If you have numbness and tingling in your hands and feet, be careful when cooking, walking, and handling sharp objects and hot liquids.  . Talk with your nurse about getting a wig before you lose your hair. Also, call the [place] at 800-ACS-2345 to find out information about the "Look Good, Feel Better" program close to where you live. It is a free program where women getting chemotherapy can learn about wigs, turbans and scarves as well as makeup techniques and skin and nail care.  Food and Drug Interactions  . There are known interactions of Vincristine with grapefruit. Do not eat grapefruit or drink grapefruit juice while taking this drug.  . Check with your doctor or pharmacist about all other prescription medicines and dietary supplements you are taking before starting this medicine as there are a lot of known drug interactions with Vincristine. Also, check with your doctor or pharmacist before starting any new prescription, over-the-counter medicines, or dietary supplement to make sure that there are no interactions.  When to Call the  Doctor  Call your doctor or nurse right away if you have any of these symptoms:  . Redness, pain, warmth, or swelling at  the IV site while you are getting this drug  . No bowel movement in 3 days or when you feel uncomfortable.  . Abdominal pain  . Fever of 100.5 F (38 C) or higher  . Chills  . Feeling short of breath  . Easy bleeding or bruising  . Jaw pain  . Hoarseness  . Drooping eyelids  . Blurred vision or other changes in eyesight  . Feeling confused, restless, or irritable  . Seizures (Common symptoms of a seizure can include confusion, blacking out, passing out, loss of hearing or vision, blurred vision, unusual smells or tastes (such as burning rubber), trouble talking, tremors or shaking in parts or all of the body, repeated body movements, tense muscles that do not relax, and loss of control of urine and bowels. There are other less common symptoms of seizures. If you or your family member suspects you are having a seizure, call 911 right away).  . Hallucinations (seeing, hearing, or feeling things that are not really there)  . Feeling  . Rash  . Chest pain or symptoms of a heart attack. Most heart attacks involve pain in the center of the chest that lasts more than a few minutes. The pain may go away and come back. It can feel like pressure, squeezing, fullness, or pain. Sometimes pain is felt in one or both arms, the back, neck, jaw, or stomach. If any of these symptoms last 2 minutes, call 911.  Call your doctor or nurse as soon as possible if you have any of these symptoms:  . Pain in fingers, toes, joints, or testicles  . Numbness, tingling, or decreased feeling or weakness in fingers, toes, arms, or legs   . Trouble walking or changes in the way you walk  . Swelling of your legs, ankles, and/or feet  . Headache  . Pain in your mouth or throat that makes it hard to eat or drink  . Loss of appetite or weight loss  . Hearing changes  . Depression  . Trouble sleeping  Reproduction Concerns  . Women may go through signs of menopause (change of life) like vaginal  dryness or itching. Vaginal lubricants can be used to lessen vaginal dryness, itching, and pain during sexual relations.  . Pregnancy warning: This drug may have harmful effects on the unborn child, so effective methods of birth control should be used during your cancer treatment.  . Breast feeding warning: It is not known if this drug passes into breast milk. For this reason, women should talk to their doctor about the risks and benefits of breast feeding during treatment with this drug because this drug may enter the breast milk and badly harm a breast feeding baby.  Rituximab (Generic Name) Other Name: Rituxan  About This Drug  Rituximab is a monoclonal antibody used to treat cancer. This drug is given in the vein (IV).  This first time this is given it will be infused slower to monitor for infusion reactions.    Possible Side Effects (More Common)  . Bone marrow depression. This is a decrease in the number of white blood cells, red blood cells, and platelets. This  may raise your risk of infection, make you tired and weak (fatigue), and raise your risk of bleeding.  . Rash-skin irritation, redness or itching (dermatitis)  . Flu-like symptoms: fever, headache,  muscle and joint aches, and fatigue (low energy, feeling weak)  . Infusion-related reactions  . Hepatitis B - if you have ever had hepatitis B, the virus may come back during treatment with this drug. Your doctor will test to see if you have ever had hepatitis B prior to your treatment.  . Changes in your central nervous system can happen. The central nervous system is made up of your brain and spinal cord. You could feel: extreme tiredness, agitation, confusion, or have: hallucinations (see or hear things that are not there), trouble understanding or speaking, loss of control of your bowels or bladder, eyesight changes, numbness or lack of strength to your arms, legs, face, or body, seizures or coma. If you start to have any of  these symptoms let your doctor know right away.  . Tumor lysis: This drug may act on the cancer cells very quickly. This may affect how your kidneys work. Your doctor will monitor your kidney function.  . Changes in your liver function. Your doctor will check your liver function as needed.  . Nausea and throwing up (vomiting): these symptoms may happen within a few hours after your treatment and may last up to 24 hours. Medicines are available to stop or lessen these side effects.  . Loose bowel movements (diarrhea) that may last for a few days  . Abdominal pain  . Infections  . Cough, runny nose  . Swelling of your legs, ankles and/or feet or hands  . High blood pressure. Your doctor will check your blood pressure as needed.  . Abnormal heart beat  Possible Side Effects (Less Common)  . Shortness of breath  . Soreness of the mouth and throat. You may have red areas, white patches, or sores that hurt.  Infusion Reactions  Infusion Reactions are the most common side effect linked to use of this drug and can be quite severe. Medicines will be given before you get the drug to lower the severity of this side effect. The infusion reactions are the worse with the first dose of the drug and become less severe with more doses of the drug. While you are getting this drug in your vein (IV), tell your nurse right away if you have any of these symptoms of an allergic reaction:  . Trouble catching your breath  . Feeling like your tongue or throat are swelling  . Feeling your heart beat quickly or in a not normal way (palpitations)  . Feeling dizzy or lightheaded  . Flushing, itching, rash, and/or hives  Treating Side Effects  . Ask your doctor or nurse about medicine to stop or lessen headache, loose bowel movements (diarrhea), constipation, nausea, throwing up (vomiting), or pain.  . If you get a rash do not put anything it unless your doctor or nurse says you may. Keep the area  around the rash clean and dry. Ask your doctor for medicine if the rash bothers you.  . Drink 6-8 cups of fluids each day unless your doctor has told you to limit your fluid intake due to some other health problem. A cup is 8 ounces of fluid. If you throw up or have loose bowel movements, you should drink more fluids so that you do not become dehydrated (lack of water in the body from losing too much fluid).  . If you are not able to move your bowels, check with your doctor or nurse before you use enemas, laxatives, or suppositories  . If you have mouth sores,  avoid mouthwash that has alcohol. Also avoid alcohol and smoking because they can bother your mouth and throat.  . If you have a nose bleed, sit with your head tipped slightly forward. Apply pressure by lightly pinching the bridge of your nose between your thumb and forefinger. Call your doctor if you feel dizzy or faint or if the bleeding doesn't stop after 10 to 15 minutes  Important Information  . After treatment with this drug, vaccination with live viruses should be delayed until the immune system recovers.  . Symptoms of abnormal bleeding may be: coughing up blood, throwing up blood (may look like coffee grounds), red or black, tarry bowel movements, blood in urine, abnormally heavy menstrual flow, nosebleeds, or any unusual bleeding.  . Symptoms of high blood pressure may be: headache, blurred vision, confusion, chest pain, or a feeling that your heart is beat differently.  Marland Kitchen Urinary tract infection. Symptoms may include:  . Pain or burning when you pass urine  . Feeling like you have to pass urine often, but not much comes out when you do.  . Tender or heavy feeling in your lower abdomen  . Cloudy urine and/or urine that smells bad.  . Pain on one side of your back under your ribs. This is where your kidneys are.  . Fever, chills, nausea and/or throwing up  Food and Drug Interactions  There are no known interactions of  rituximab and any food. This drug may interact with other medicines. Tell your doctor and pharmacist about all the medicines and dietary supplements (vitamins, minerals, herbs and others) that you are taking at this time. The safety and use of dietary supplements and alternative diets are often not known. Using these might affect your cancer or interfere with your treatment. Until more is known, you should not use dietary supplements or alternative diets without your cancer doctor's help.  When to Call the Doctor  Call your doctor or nurse right away if you have any of these symptoms:  . Fever of 100.5 F (38 C) higher  . Chills  . Trouble breathing  . Rash with or without itching  . Blistering or peeling of skin  . Chest pain or symptoms of a heart attack. Most heart attacks involve pain in the center of the chest that lasts more than a few minutes. The pain may go away and come back or it can be constant. It can feel like pressure, squeezing, fullness, or pain. Sometimes pain is felt in one or both arms, the back, neck, jaw, or stomach. If any of these symptoms last 2 minutes, call 911  . Easy bleeding or bruising  . Blood in urine or bowel movements  . Feeling that your heart is beating in a fast or not normal way (palpitations)  . Nausea that stops you from eating or drinking  . Throwing up (vomiting) more than 3 times in one day  . Abdominal pain  . Loose bowel movements (diarrhea) 4 times in one day or diarrhea with weakness or lightheadedness  . No bowel movement in 3 days or if you feel uncomfortable  . Feeling dizzy or lightheaded  . Changes in your speech or vision  . Feeling confused  . Weakness of your arms and legs or poor coordination (feeling clumsy)  . Signs of liver problems: dark urine, pale bowel movements, bad stomach pain, feeling very tired and weak, unusual itching, or yellowing of skin or eyes  . Symptoms of a urinary tract infection (  see important  information)  Call your doctor or nurse as soon as possible if you have any of these symptoms:  . Swelling of your legs, ankles and/or feet  . Fatigue and /or weakness that interferes with your daily activities  . Joint and muscle pain or muscle spasms that are not relieved by prescribed medicines  . Cough that lasts longer than normal    Reproduction Concerns  . Pregnancy warning: This drug is known to cross the placenta. This drug may have harmful effects on an unborn baby. Effective methods of birth control should be used during treatment with this drug and for 12 months after the last treatment. If exposure occurs to an unborn baby, the baby's immune system may be affected, which could last for months after birth. Until the immune system recovers, live vaccines should not be administered to the baby. Be sure to talk with your doctor if you are pregnant or planning to become pregnant while getting this drug.  . Breast feeding warning: It is not known if rituximab is passed into human breast milk. In animal studies, this drug was detected in in breast milk. For this reason, women should talk to their doctor about the risks and benefits of breast feeding during treatment with this drug because this drug may enter the breast milk and badly harm a breast feeding baby.  Prednisone  About This Drug  Prednisone is a steroid that may be used to treat cancer. It is given orally (by mouth).  Possible Side Effects . Abnormal heart beat  . Headache  . High blood pressure  . Increased sweating  . Blurred vision or other changes in eyesight  . Tiredness and weakness  . Changes in mood, which may include depression or a feeling of extreme well-being  . Trouble sleeping  . Feeling restless (unable to relax)  . Skin changes such as rash, dryness, or redness  . Increased appetite (increased hunger)  . Weight gain  . Electrolyte changes  . Increased risk of infections  .  Aggravation of stomach ulcers  . Pain in your abdomen  . Nausea  . Blood sugar levels may change  . Swelling of your legs, ankles and/or feet  . Muscle loss and/or weakness (lack of muscle strength)  . Increased risk of developing osteoporosis- your bones may become weak and brittle  . Increased risk for cataracts, glaucoma or infections of the eye  . Changes in your liver function  Note: Not all possible side effects are included above.  Warnings and Precautions  . High blood pressure and changes in electrolytes, which can cause fluid build-up around your heart, lungs or elsewhere.  . Severe infections, which can be life-threatening.  . Allergic reactions, including anaphylaxis are rare but may happen in some patients. Signs of allergic reaction to this drug may be swelling of the face, feeling like your tongue or throat are swelling, trouble breathing, rash, itching, fever, chills, feeling dizzy, and/or feeling that your heart is beating in a fast or not normal way. If this happens, do not take another dose of this drug. You should get urgent medical treatment.  . Increased risk of developing a hole in your stomach, small, and/or large intestine if you have ulcers in the lining of your stomach and/or intestine, or have diverticulitis, ulcerative colitis and/or other diseases that affect the gastrointestinal tract.  . Blurred vision or other changes in eyesight.  . Severe depression and other psychiatric disorders such as mood changes.  Marland Kitchen  Effects on the endocrine glands including pituitary, adrenals and thyroid during or after use of this medication.  Note: Some of the side effects above are very rare. If you have concerns and/or questions, please discuss them with your medical team.  Important Information  . Talk to your doctor or your nurse before stopping this medication, it should be stopped gradually. Depending on the dose and length of treatment, you could  experience serious side effects if stopped abruptly (suddenly).  . Talk to your doctor before receiving any vaccinations during your treatment. Some vaccinations are not recommended while receiving prednisone.  How to Take Your Medication  . For Oral (by mouth): You can take the medicine with or without food, but it is preferable to be taken with food, especially If you have nausea or upset stomach.  . Missed dose: If you miss or vomit a dose, contact your physician. Do not take 2 doses at the same time and do not double up on the next dose.  Marland Kitchen Handling: Wash your hands after handling your medicine, your caretakers should not handle your medicine with bare hands and should wear latex gloves.  . Storage: Store this medicine in the original container at room temperature, protect from moisture. Discuss with your nurse or your doctor how to dispose of unused medicine.  Treating Side Effects  . Drink plenty of fluids (a minimum of eight glasses per day is recommended).  . To help with nausea and vomiting, eat small, frequent meals instead of three large meals a day. Choose foods and drinks that are at room temperature. Ask your nurse or doctor about other helpful tips and medicine that is available to help stop or lessen these symptoms.  . If you throw up, you should drink more fluids so that you do not become dehydrated (lack of water in the body from losing too much fluid).  . Manage tiredness by pacing your activities for the day. Be sure to include periods of rest between energy-draining activities.  . To help with muscle weakness, get regular exercise. If you feel too tired to exercise vigorously, try taking a short walk.  . If you are having trouble sleeping, talk to your nurse or doctor on tips to help you sleep better.  . If you are feeling depressed, talk to your nurse or doctor about it.  Marland Kitchen Keeping your pain under control is important to your well-being. Please tell your  doctor or nurse if you are experiencing pain.  . If you have diabetes, keep good control of your blood sugar level. Tell your nurse or your doctor if your glucose levels are higher or lower than normal.  . To decrease the risk of infection, wash your hands regularly.  . Avoid close contact with people who have a cold, the flu, or other infections.  . Take your temperature as your doctor or nurse tells you, and whenever you feel like you may have a fever.  . If you get a rash do not put anything on it unless your doctor or nurse says you may. Keep the area around the rash clean and dry. Ask your doctor for medicine if your rash bothers you.  . Moisturize your skin several times a day  . Avoid sun exposure and apply sunscreen routinely when outdoors.  Food and Drug Interactions  . This drug may interact with grapefruit and grapefruit juice. Talk to your doctor as this could make side effects worse.  . Check with your doctor  or pharmacist about all other prescription medicines and over-the-counter medicines and dietary supplements (vitamins, minerals, herbs and others) you are taking before starting this medicine as there are known drug interactions with prednisone. Also, check with your doctor or pharmacist before starting any new prescription or over-the-counter medicines, or dietary supplements to make sure that there are no interactions.  . There are known interactions of prednisone with other medicines and products like aspirin, and other nonsteroidal anti-inflammatory agents. Ask your doctor what over-the-counter (OTC) medicines you can take.  . There are known interactions of prednisone with blood thinning medicine such as warfarin. Ask your doctor what precautions you should take.  Marland Kitchen Avoid the use of St. John's Wort while taking prednisone as this may lower the levels of the drug in your body, which can make it less effective.  When to Call the Doctor  Call your doctor  or nurse if you have any of these symptoms and/or any new or unusual symptoms:  . Fever of 100.4 F (38 C) or higher  . Chills  . A headache that does not go away  . Blurry vision or other changes in eyesight  . Feel irritable, nervous or restless  . Trouble sleeping  . Tiredness or weakness that interferes with your daily activities  . Trouble breathing  . Feeling that your heart is beating in a fast or not normal way (palpitations)  . Chest pain or symptoms of a heart attack. Most heart attacks involve pain in the center of the chest that lasts more than a few minutes. The pain may go away and come back or it can be constant. It can feel like pressure, squeezing, fullness, or pain. Sometimes pain is felt in one or both arms, the back, neck, jaw, or stomach. If any of these symptoms last 2 minutes, call 911.  . Nausea that stops you from eating or drinking and/or is not relieved by prescribed medicines  . Throwing up more than 3 times a day  . Severe abdominal pain that does not go away  . Heartburn or indigestion  . Abnormal blood sugar  . Severe mood changes such as depression or unusual thoughts and/or behaviors  . Thoughts of hurting yourself or others, and suicide  . Feeling hopeless on most days  . Unusual thirst, passing urine often, headache, sweating, shakiness, irritability  . Swelling of legs, ankles, or feet  . Weight gain of 5 pounds in one week (fluid retention)  . A new rash or a rash that is not relieved by prescribed medicines  . Signs of possible liver problems: dark urine, pale bowel movements, bad stomach pain, feeling very tired and weak, unusual itching, or yellowing of the eyes or skin  . Signs of allergic reaction: swelling of the face, feeling like your tongue or throat are swelling, trouble breathing, rash, itching, fever, chills, feeling dizzy, and/or feeling that your heart is beating in a fast or not normal way. If this happens,  call 911 for emergency care.  . If you think you may be pregnant  Reproduction Warnings  . Pregnancy warning: It is not known if this drug may harm an unborn child. For this reason, be sure to talk with your doctor if you are pregnant or planning to become pregnant while receiving this drug. Let your doctor know right away if you think you may be pregnant.  . Breastfeeding warning: It is not known if this drug passes into breast milk. For this reason, women  should talk to their doctor about the risks and benefits of breastfeeding during treatment with this drug because this drug may enter the breast milk and cause harm to a breastfeeding baby.  . Fertility warning: Human fertility studies have not been done with this drug. Talk with your doctor or nurse if you plan to have children. Ask for information on sperm or egg banking.  SELF CARE ACTIVITIES WHILE ON CHEMOTHERAPY:  Hydration Increase your fluid intake 48 hours prior to treatment and drink at least 8 to 12 cups (64 ounces) of water/decaffeinated beverages per day after treatment. You can still have your cup of coffee or soda but these beverages do not count as part of your 8 to 12 cups that you need to drink daily. No alcohol intake.  Medications Continue taking your normal prescription medication as prescribed.  If you start any new herbal or new supplements please let us know first to make sure it is safe.  Mouth Care Have teeth cleaned professionally before starting treatment. Keep dentures and partial plates clean. Use soft toothbrush and do not use mouthwashes that contain alcohol. Biotene is a good mouthwash that is available at most pharmacies or may be ordered by calling [phone]. Use warm salt water gargles (1 teaspoon salt per 1 quart warm water) before and after meals and at bedtime. Or you may rinse with 2 tablespoons of three-percent hydrogen peroxide mixed in eight ounces of water. If you are still having problems  with your mouth or sores in your mouth please call the clinic. If you need dental work, please let the doctor know before you go for your appointment so that we can coordinate the best possible time for you in regards to your chemo regimen. You need to also let your dentist know that you are actively taking chemo. We may need to do labs prior to your dental appointment.  Skin Care Always use sunscreen that has not expired and with SPF (Sun Protection Factor) of 50 or higher. Wear hats to protect your head from the sun. Remember to use sunscreen on your hands, ears, face, & feet.  Use good moisturizing lotions such as udder cream, eucerin, or even Vaseline. Some chemotherapies can cause dry skin, color changes in your skin and nails.    . Avoid long, hot showers or baths. . Use gentle, fragrance-free soaps and laundry detergent. . Use moisturizers, preferably creams or ointments rather than lotions because the thicker consistency is better at preventing skin dehydration. Apply the cream or ointment within 15 minutes of showering. Reapply moisturizer at night, and moisturize your hands every time after you wash them.  Hair Loss (if your doctor says your hair will fall out)  . If your doctor says that your hair is likely to fall out, decide before you begin chemo whether you want to wear a wig. You may want to shop before treatment to match your hair color. . Hats, turbans, and scarves can also camouflage hair loss, although some people prefer to leave their heads uncovered. If you go bare-headed outdoors, be sure to use sunscreen on your scalp. . Cut your hair short. It eases the inconvenience of shedding lots of hair, but it also can reduce the emotional impact of watching your hair fall out. . Don't perm or color your hair during chemotherapy. Those chemical treatments are already damaging to hair and can enhance hair loss. Once your chemo treatments are done and your hair has grown back, it's OK to  resume dyeing or perming hair.  With chemotherapy, hair loss is almost always temporary. But when it grows back, it may be a different color or texture. In older adults who still had hair color before chemotherapy, the new growth may be completely gray.  Often, new hair is very fine and soft.  Infection Prevention Please wash your hands for at least 30 seconds using warm soapy water. Handwashing is the #1 way to prevent the spread of germs. Stay away from sick people or people who are getting over a cold. If you develop respiratory systems such as green/yellow mucus production or productive cough or persistent cough let us know and we will see if you need an antibiotic. It is a good idea to keep a pair of gloves on when going into grocery stores/[employer] to decrease your risk of coming into contact with germs on the carts, etc. Carry alcohol hand gel with you at all times and use it frequently if out in public. If your temperature reaches 100.5 or higher please call the clinic and let us know.  If it is after hours or on the weekend please go to the ER if your temperature is over 100.5.  Please have your own personal thermometer at home to use.    Sex and bodily fluids If you are going to have sex, a condom must be used to protect the person that isn't taking chemotherapy. Chemo can decrease your libido (sex drive). For a few days after chemotherapy, chemotherapy can be excreted through your bodily fluids.  When using the toilet please close the lid and flush the toilet twice.  Do this for a few day after you have had chemotherapy.   Effects of chemotherapy on your sex life Some changes are simple and won't last long. They won't affect your sex life permanently.  Sometimes you may feel: . too tired . not strong enough to be very active . sick or sore  . not in the mood . anxious or low Your anxiety might not seem related to sex. For example, you may be worried about the cancer and how your  treatment is going. Or you may be worried about money, or about how you family are coping with your illness. These things can cause stress, which can affect your interest in sex. It's important to talk to your partner about how you feel. Remember - the changes to your sex life don't usually last long. There's usually no medical reason to stop having sex during chemo. The drugs won't have any long term physical effects on your performance or enjoyment of sex. Cancer can't be passed on to your partner during sex  Contraception It's important to use reliable contraception during treatment. Avoid getting pregnant while you or your partner are having chemotherapy. This is because the drugs may harm the baby. Sometimes chemotherapy drugs can leave a man or woman infertile.  This means you would not be able to have children in the future. You might want to talk to someone about permanent infertility. It can be very difficult to learn that you may no longer be able to have children. Some people find counselling helpful. There might be ways to preserve your fertility, although this is easier for men than for women. You may want to speak to a fertility expert. You can talk about sperm banking or harvesting your eggs. You can also ask about other fertility options, such as donor eggs. If you have or have had breast cancer, your  doctor might advise you not to take the contraceptive pill. This is because the hormones in it might affect the cancer.  It is not known for sure whether or not chemotherapy drugs can be passed on through semen or secretions from the vagina. Because of this some doctors advise people to use a barrier method if you have sex during treatment. This applies to vaginal, anal or oral sex. Generally, doctors advise a barrier method only for the time you are actually having the treatment and for about a week after your treatment. Advice like this can be worrying, but this does not mean that you have  to avoid being intimate with your partner. You can still have close contact with your partner and continue to enjoy sex.  Animals If you have cats or birds we just ask that you not change the litter or change the cage.  Please have someone else do this for you while you are on chemotherapy.   Food Safety During and After Cancer Treatment Food safety is important for people both during and after cancer treatment. Cancer and cancer treatments, such as chemotherapy, radiation therapy, and stem cell/bone marrow transplantation, often weaken the immune system. This makes it harder for your body to protect itself from foodborne illness, also called food poisoning. Foodborne illness is caused by eating food that contains harmful bacteria, parasites, or viruses.  Foods to avoid Some foods have a higher risk of becoming tainted with bacteria. These include: Marland Kitchen Unwashed fresh fruit and vegetables, especially leafy vegetables that can hide dirt and other contaminants . Raw sprouts, such as alfalfa sprouts . Raw or undercooked beef, especially ground beef, or other raw or undercooked meat and poultry . Fatty, fried, or spicy foods immediately before or after treatment.  These can sit heavy on your stomach and make you feel nauseous. . Raw or undercooked shellfish, such as oysters. . Sushi and sashimi, which often contain raw fish.  . Unpasteurized beverages, such as unpasteurized fruit juices, raw milk, raw yogurt, or cider . Undercooked eggs, such as soft boiled, over easy, and poached; raw, unpasteurized eggs; or foods made with raw egg, such as homemade raw cookie dough and homemade mayonnaise  Simple steps for food safety  Shop smart. . Do not buy food stored or displayed in an unclean area. . Do not buy bruised or damaged fruits or vegetables. . Do not buy cans that have cracks, dents, or bulges. . Pick up foods that can spoil at the end of your shopping trip and store them in a cooler on the way  home.  Prepare and clean up foods carefully. . Rinse all fresh fruits and vegetables under running water, and dry them with a clean towel or paper towel. . Clean the top of cans before opening them. . After preparing food, wash your hands for 20 seconds with hot water and soap. Pay special attention to areas between fingers and under nails. . Clean your utensils and dishes with hot water and soap. Marland Kitchen Disinfect your kitchen and cutting boards using 1 teaspoon of liquid, unscented bleach mixed into 1 quart of water.    Dispose of old food. . Eat canned and packaged food before its expiration date (the "use by" or "best before" date). . Consume refrigerated leftovers within 3 to 4 days. After that time, throw out the food. Even if the food does not smell or look spoiled, it still may be unsafe. Some bacteria, such as Listeria, can grow even on  foods stored in the refrigerator if they are kept for too long.  Take precautions when eating out. . At restaurants, avoid buffets and salad bars where food sits out for a long time and comes in contact with many people. Food can become contaminated when someone with a virus, often a norovirus, or another "bug" handles it. . Put any leftover food in a "to-go" container yourself, rather than having the server do it. And, refrigerate leftovers as soon as you get home. . Choose restaurants that are clean and that are willing to prepare your food as you order it cooked.   MEDICATIONS:                                                                                                                                                                Compazine/Prochlorperazine 10mg  tablet. Take 1 tablet every 6 hours as needed for nausea/vomiting. (This can make you sleepy)   EMLA cream. Apply a quarter size amount to port site 1 hour prior to chemo. Do not rub in. Cover with plastic wrap.   Over-the-Counter Meds:  Colace - 100 mg capsules - take 2 capsules daily.   If this doesn't help then you can increase to 2 capsules twice daily.  Call us if this does not help your bowels move.   Imodium 2mg  capsule. Take 2 capsules after the 1st loose stool and then 1 capsule every 2 hours until you go a total of 12 hours without having a loose stool. Call the [facility] if loose stools continue. If diarrhea occurs at bedtime, take 2 capsules at bedtime. Then take 2 capsules every 4 hours until morning. Call [facility].    Diarrhea Sheet   If you are having loose stools/diarrhea, please purchase Imodium and begin taking as outlined:  At the first sign of poorly formed or loose stools you should begin taking Imodium (loperamide) 2 mg capsules.  Take two tablets (4mg ) followed by one tablet (2mg ) every 2 hours - DO NOT EXCEED 8 tablets in 24 hours.  If it is bedtime and you are having loose stools, take 2 tablets at bedtime, then 2 tablets every 4 hours until morning.   Always call the [facility] if you are having loose stools/diarrhea that you can't get under control.  Loose stools/diarrhea leads to dehydration (loss of water) in your body.  We have other options of trying to get the loose stools/diarrhea to stop but you must let us know!   Constipation Sheet  Colace - 100 mg capsules - take 2 capsules daily.  If this doesn't help then you can increase to 2 capsules twice daily.  Please call if the above does not work for you.   Do not go more than 2 days without  a bowel movement.  It is very important that you do not become constipated.  It will make you feel sick to your stomach (nausea) and can cause abdominal pain and vomiting.   Nausea Sheet   Compazine/Prochlorperazine 10mg  tablet. Take 1 tablet every 6 hours as needed for nausea/vomiting. (This can make you sleepy)  If you are having persistent nausea (nausea that does not stop) please call the [facility] and let us know the amount of nausea that you are experiencing.  If you begin to vomit,  you need to call the [facility] and if it is the weekend and you have vomited more than one time and can't get it to stop-go to the Emergency Room.  Persistent nausea/vomiting can lead to dehydration (loss of fluid in your body) and will make you feel terrible.   Ice chips, sips of clear liquids, foods that are @ room temperature, crackers, and toast tend to be better tolerated.   SYMPTOMS TO REPORT AS SOON AS POSSIBLE AFTER TREATMENT:   FEVER GREATER THAN 100.5 F  CHILLS WITH OR WITHOUT FEVER  NAUSEA AND VOMITING THAT IS NOT CONTROLLED WITH YOUR NAUSEA MEDICATION  UNUSUAL SHORTNESS OF BREATH  UNUSUAL BRUISING OR BLEEDING  TENDERNESS IN MOUTH AND THROAT WITH OR WITHOUT PRESENCE OF ULCERS  URINARY PROBLEMS  BOWEL PROBLEMS  UNUSUAL RASH      Wear comfortable clothing and clothing appropriate for easy access to any Portacath or PICC line. Let us know if there is anything that we can do to make your therapy better!    What to do if you need assistance after hours or on the weekends: CALL [phone].  HOLD on the line, do not hang up.  You will hear multiple messages but at the end you will be connected with a nurse triage line.  They will contact the doctor if necessary.  Most of the time they will be able to assist you.  Do not call the hospital operator.      I have been informed and understand all of the instructions given to me and have received a copy. I have been instructed to call the clinic [phone] or my family physician as soon as possible for continued medical care, if indicated. I do not have any more questions at this time but understand that I may call the [facility] or the Patient Navigator at [phone] during office hours should I have questions or need assistance in obtaining follow-up care.

## 2018-05-30 ENCOUNTER — Encounter: Payer: Self-pay | Admitting: General Surgery

## 2018-06-01 ENCOUNTER — Inpatient Hospital Stay (HOSPITAL_COMMUNITY): Payer: BLUE CROSS/BLUE SHIELD

## 2018-06-01 ENCOUNTER — Inpatient Hospital Stay (HOSPITAL_BASED_OUTPATIENT_CLINIC_OR_DEPARTMENT_OTHER): Payer: BLUE CROSS/BLUE SHIELD | Admitting: Hematology

## 2018-06-01 ENCOUNTER — Encounter (HOSPITAL_COMMUNITY): Payer: Self-pay | Admitting: Hematology

## 2018-06-01 ENCOUNTER — Telehealth (HOSPITAL_COMMUNITY): Payer: Self-pay | Admitting: Hematology

## 2018-06-01 ENCOUNTER — Other Ambulatory Visit: Payer: Self-pay

## 2018-06-01 VITALS — BP 130/83 | HR 87 | Temp 98.6°F | Resp 18

## 2018-06-01 DIAGNOSIS — C8104 Nodular lymphocyte predominant Hodgkin lymphoma, lymph nodes of axilla and upper limb: Secondary | ICD-10-CM

## 2018-06-01 DIAGNOSIS — Z5112 Encounter for antineoplastic immunotherapy: Secondary | ICD-10-CM | POA: Diagnosis not present

## 2018-06-01 DIAGNOSIS — Z5111 Encounter for antineoplastic chemotherapy: Secondary | ICD-10-CM | POA: Diagnosis not present

## 2018-06-01 DIAGNOSIS — C8108 Nodular lymphocyte predominant Hodgkin lymphoma, lymph nodes of multiple sites: Secondary | ICD-10-CM

## 2018-06-01 DIAGNOSIS — Z23 Encounter for immunization: Secondary | ICD-10-CM | POA: Diagnosis not present

## 2018-06-01 DIAGNOSIS — R590 Localized enlarged lymph nodes: Secondary | ICD-10-CM | POA: Diagnosis not present

## 2018-06-01 DIAGNOSIS — Z5189 Encounter for other specified aftercare: Secondary | ICD-10-CM | POA: Diagnosis not present

## 2018-06-01 LAB — COMPREHENSIVE METABOLIC PANEL
ALT: 31 U/L (ref 0–44)
AST: 20 U/L (ref 15–41)
Albumin: 4.6 g/dL (ref 3.5–5.0)
Alkaline Phosphatase: 66 U/L (ref 38–126)
Anion gap: 12 (ref 5–15)
BUN: 15 mg/dL (ref 6–20)
CO2: 22 mmol/L (ref 22–32)
Calcium: 9.4 mg/dL (ref 8.9–10.3)
Chloride: 104 mmol/L (ref 98–111)
Creatinine, Ser: 0.98 mg/dL (ref 0.61–1.24)
GFR calc Af Amer: >60 mL/min (ref >60)
GFR calc non Af Amer: >60 mL/min (ref >60)
Glucose, Bld: 205 mg/dL — ABNORMAL HIGH (ref 70–99)
Potassium: 3.4 mmol/L — ABNORMAL LOW (ref 3.5–5.1)
Sodium: 138 mmol/L (ref 135–145)
Total Bilirubin: 0.9 mg/dL (ref 0.3–1.2)
Total Protein: 7.8 g/dL (ref 6.5–8.1)

## 2018-06-01 LAB — CBC WITH DIFFERENTIAL/PLATELET
Abs Immature Granulocytes: 0 10*3/uL (ref 0.00–0.07)
Basophils Absolute: 0.1 10*3/uL (ref 0.0–0.1)
Basophils Relative: 1 %
Eosinophils Absolute: 0.1 10*3/uL (ref 0.0–0.5)
Eosinophils Relative: 2 %
HCT: 45.9 % (ref 39.0–52.0)
Hemoglobin: 15.1 g/dL (ref 13.0–17.0)
Immature Granulocytes: 0 %
Lymphocytes Relative: 50 %
Lymphs Abs: 2.6 10*3/uL (ref 0.7–4.0)
MCH: 27.2 pg (ref 26.0–34.0)
MCHC: 32.9 g/dL (ref 30.0–36.0)
MCV: 82.7 fL (ref 80.0–100.0)
Monocytes Absolute: 0.4 10*3/uL (ref 0.1–1.0)
Monocytes Relative: 7 %
Neutro Abs: 2.1 10*3/uL (ref 1.7–7.7)
Neutrophils Relative %: 40 %
Platelets: 211 10*3/uL (ref 150–400)
RBC: 5.55 MIL/uL (ref 4.22–5.81)
RDW: 14.4 % (ref 11.5–15.5)
WBC: 5.2 10*3/uL (ref 4.0–10.5)
nRBC: 0 % (ref 0.0–0.2)

## 2018-06-01 MED ORDER — FAMOTIDINE IN NACL 20-0.9 MG/50ML-% IV SOLN
INTRAVENOUS | Status: AC
  Filled 2018-06-01: qty 50

## 2018-06-01 MED ORDER — ACETAMINOPHEN 325 MG PO TABS
650.0000 mg | ORAL_TABLET | Freq: Once | ORAL | Status: AC
  Administered 2018-06-01: 09:00:00 650 mg via ORAL

## 2018-06-01 MED ORDER — HEPARIN SOD (PORK) LOCK FLUSH 100 UNIT/ML IV SOLN
500.0000 [IU] | Freq: Once | INTRAVENOUS | Status: AC | PRN
  Administered 2018-06-01: 16:00:00 500 [IU]

## 2018-06-01 MED ORDER — DIPHENHYDRAMINE HCL 25 MG PO CAPS
ORAL_CAPSULE | ORAL | Status: AC
  Filled 2018-06-01: qty 2

## 2018-06-01 MED ORDER — VINCRISTINE SULFATE CHEMO INJECTION 1 MG/ML
2.0000 mg | Freq: Once | INTRAVENOUS | Status: AC
  Administered 2018-06-01: 15:00:00 2 mg via INTRAVENOUS
  Filled 2018-06-01: qty 2

## 2018-06-01 MED ORDER — PEGFILGRASTIM 6 MG/0.6ML ~~LOC~~ PSKT
6.0000 mg | PREFILLED_SYRINGE | Freq: Once | SUBCUTANEOUS | Status: AC
  Administered 2018-06-01: 16:00:00 6 mg via SUBCUTANEOUS
  Filled 2018-06-01: qty 0.6

## 2018-06-01 MED ORDER — FAMOTIDINE IN NACL 20-0.9 MG/50ML-% IV SOLN
20.0000 mg | Freq: Once | INTRAVENOUS | Status: AC
  Administered 2018-06-01: 09:00:00 20 mg via INTRAVENOUS

## 2018-06-01 MED ORDER — PALONOSETRON HCL INJECTION 0.25 MG/5ML
0.2500 mg | Freq: Once | INTRAVENOUS | Status: AC
  Administered 2018-06-01: 09:00:00 0.25 mg via INTRAVENOUS
  Filled 2018-06-01: qty 5

## 2018-06-01 MED ORDER — SODIUM CHLORIDE 0.9 % IV SOLN
750.0000 mg/m2 | Freq: Once | INTRAVENOUS | Status: AC
  Administered 2018-06-01: 15:00:00 1760 mg via INTRAVENOUS
  Filled 2018-06-01: qty 88

## 2018-06-01 MED ORDER — DOXORUBICIN HCL CHEMO IV INJECTION 2 MG/ML
50.0000 mg/m2 | Freq: Once | INTRAVENOUS | Status: AC
  Administered 2018-06-01: 15:00:00 118 mg via INTRAVENOUS
  Filled 2018-06-01: qty 59

## 2018-06-01 MED ORDER — SODIUM CHLORIDE 0.9% FLUSH
10.0000 mL | INTRAVENOUS | Status: DC | PRN
  Administered 2018-06-01 (×2): 10 mL
  Filled 2018-06-01 (×2): qty 10

## 2018-06-01 MED ORDER — SODIUM CHLORIDE 0.9 % IV SOLN
375.0000 mg/m2 | Freq: Once | INTRAVENOUS | Status: AC
  Administered 2018-06-01: 900 mg via INTRAVENOUS
  Filled 2018-06-01: qty 50

## 2018-06-01 MED ORDER — ACETAMINOPHEN 325 MG PO TABS
ORAL_TABLET | ORAL | Status: AC
  Filled 2018-06-01: qty 2

## 2018-06-01 MED ORDER — SODIUM CHLORIDE 0.9 % IV SOLN
Freq: Once | INTRAVENOUS | Status: AC
  Administered 2018-06-01: 09:00:00 via INTRAVENOUS

## 2018-06-01 MED ORDER — SODIUM CHLORIDE 0.9 % IV SOLN
10.0000 mg | Freq: Once | INTRAVENOUS | Status: AC
  Administered 2018-06-01: 10 mg via INTRAVENOUS
  Filled 2018-06-01: qty 10

## 2018-06-01 MED ORDER — DIPHENHYDRAMINE HCL 25 MG PO CAPS
50.0000 mg | ORAL_CAPSULE | Freq: Once | ORAL | Status: AC
  Administered 2018-06-01: 09:00:00 50 mg via ORAL

## 2018-06-01 NOTE — Progress Notes (Signed)
5784 CBCD and CMET results reviewed with and pt seen by Dr. Delton Coombes and pt approved for chemo tx today per MD.CXR results reviewed prior to accessing portacath as well. 6962 Drug-specific information reviewed with and given to pt by D.Wilson Therapist, sports.Pt verbalized understanding and permit for chemotherapy signed.

## 2018-06-01 NOTE — Patient Instructions (Signed)
[facility] at [facility] Discharge Instructions  You were seen today by Dr. Katragadda. He went over your recent lab results. He will see you back in  for labs and follow up.   Thank you for choosing [facility] at [facility] to provide your oncology and hematology care.  To afford each patient quality time with our provider, please arrive at least 15 minutes before your scheduled appointment time.   If you have a lab appointment with the Cancer Center please come in thru the  Main Entrance and check in at the main information desk  You need to re-schedule your appointment should you arrive 10 or more minutes late.  We strive to give you quality time with our providers, and arriving late affects you and other patients whose appointments are after yours.  Also, if you no show three or more times for appointments you may be dismissed from the clinic at the providers discretion.     Again, thank you for choosing [facility].  Our hope is that these requests will decrease the amount of time that you wait before being seen by our physicians.       _____________________________________________________________  Should you have questions after your visit to [facility], please contact our office at [phone] between the hours of 8:00 a.m. and 4:30 p.m.  Voicemails left after 4:00 p.m. will not be returned until the following business day.  For prescription refill requests, have your pharmacy contact our office and allow 72 hours.    Cancer Center Support Programs:   > Cancer Support Group  2nd Tuesday of the month 1pm-2pm, Journey Room    

## 2018-06-01 NOTE — Telephone Encounter (Signed)
Enrolled pt in to the [employer] program  [phone] crd # [id number] EXP 8/23 [phone] XMDY#[id number]

## 2018-06-01 NOTE — Progress Notes (Signed)
Devin Tran, [place] [address]   CLINIC:  Medical Oncology/Hematology  PCP:  [name], MD 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 ([place])    06/01/2018 -  Chemotherapy    The patient had DOXOrubicin (ADRIAMYCIN) chemo injection 118 mg, 50 mg/m2 = 118 mg, Intravenous,  Once, 1 of 6 cycles palonosetron (ALOXI) injection 0.25 mg, 0.25 mg, Intravenous,  Once, 1 of 6 cycles Administration: 0.25 mg (06/01/2018) pegfilgrastim (NEULASTA ONPRO KIT) injection 6 mg, 6 mg, Subcutaneous, Once, 1 of 6 cycles vinCRIStine (ONCOVIN) 2 mg in sodium chloride 0.9 % 50 mL chemo infusion, 2 mg, Intravenous,  Once, 1 of 6 cycles riTUXimab (RITUXAN) 900 mg in sodium chloride 0.9 % 250 mL (2.6471 mg/mL) infusion, 375 mg/m2 = 900 mg, Intravenous,  Once, 1 of 6 cycles Administration: 900 mg (06/01/2018) cyclophosphamide (CYTOXAN) 1,760 mg in sodium chloride 0.9 % 250 mL chemo infusion, 750 mg/m2 = 1,760 mg, Intravenous,  Once, 1 of 6 cycles  for chemotherapy treatment.       CANCER STAGING: Recurrent Stage IV NLP HL   INTERVAL HISTORY:  Devin Tran 53 y.o. male presents for Cycle 1 of RCHOP. He reports overall doing well. He denies any fevers, chills.  Positive for night sweats. Appetite is stable, denies weight loss. Denies any chest pain, SOB, lightheadedness or dizziness. He states he is ready to proceed with treatment today.  Appetite and energy levels are 100%.  He reports that he took prednisone this morning.  He is also taking allopurinol for the last 1 week.    REVIEW OF SYSTEMS:  Review of Systems  Constitutional: Negative.   HENT:  Negative.   Eyes: Negative.   Respiratory: Negative.    Cardiovascular: Negative.   Gastrointestinal: Negative.   Genitourinary: Negative.    Musculoskeletal: Negative.   Neurological: Negative.   Hematological: Negative.   Psychiatric/Behavioral: Negative.      PAST MEDICAL/SURGICAL HISTORY:  Past Medical History:  Diagnosis Date  . Complication of anesthesia   . Erectile dysfunction   . Hodgkin's lymphoma ([place])   . Hyperlipidemia   . Hypertension   . Infertility   . Low testosterone   . PONV (postoperative nausea and vomiting)    Past Surgical History:  Procedure Laterality Date  . AXILLARY LYMPH NODE BIOPSY Right 05/10/2018   Procedure: AXILLARY LYMPH NODE BIOPSY, RIGHT;  Surgeon: Aviva Signs, MD;  Location: [facility];  Service: General;  Laterality: Right;  . PORTA CATH INSERTION    . PORTA CATH REMOVAL    . PORTACATH PLACEMENT Left 05/21/2018   Procedure: INSERTION PORT-A-CATH (attached catheter in left subclavian);  Surgeon: Aviva Signs, MD;  Location: [facility];  Service: General;  Laterality: Left;  . SPLENECTOMY Left Over 20 years ago   Due to Hodgkin's lymphoma     SOCIAL HISTORY:  Social History   Socioeconomic History  . Marital status: Single    Spouse name: Not on file  . Number of children: Not on file  . Years of education: Not on file  . Highest education level: Not on file  Occupational History  . Occupation: Drive Chief Operating Officer: [employer]  .  Financial resource strain: Not on file  . Food insecurity:    Worry: Not on file    Inability: Not on file  . Transportation needs:    Medical: Not on file    Non-medical: Not on file  Tobacco Use  . Smoking status: Never Smoker  . Smokeless tobacco: Never Used  Substance and Sexual Activity  . Alcohol use: No  . Drug use: No  . Sexual activity: Yes  Lifestyle  . Physical activity:    Days per week: Not on file    Minutes per session: Not on file  . Stress: Not on file  Relationships  . Social connections:    Talks on  phone: Not on file    Gets together: Not on file    Attends religious service: Not on file    Active member of club or organization: Not on file    Attends meetings of clubs or organizations: Not on file    Relationship status: Not on file  . Intimate partner violence:    Fear of current or ex partner: Not on file    Emotionally abused: Not on file    Physically abused: Not on file    Forced sexual activity: Not on file  Other Topics Concern  . Not on file  Social History Narrative  . Not on file    FAMILY HISTORY:  Family History  Problem Relation Age of Onset  . Hypertension Mother   . Cancer Father        lung  . Hypertension Father   . Diabetes Father   . COPD Father   . Hypertension Brother   . Stroke Brother     CURRENT MEDICATIONS:  Outpatient Encounter Medications as of 06/01/2018  Medication Sig  . allopurinol (ZYLOPRIM) 300 MG tablet Take 1 tablet (300 mg total) by mouth daily.  Marland Kitchen amLODipine (NORVASC) 10 MG tablet TAKE 1 TABLET BY MOUTH EVERY DAY (Patient taking differently: Take 5 mg by mouth daily. TAKE 1 TABLET BY MOUTH EVERY DAY)  . Ascorbic Acid (VITAMIN C) 1000 MG tablet Take 1,000 mg by mouth daily. Vitamin c lozenge  . aspirin EC 81 MG tablet Take 81 mg by mouth daily.   . CYCLOPHOSPHAMIDE IV Inject into the vein every 21 ( twenty-one) days.  Marland Kitchen DOXOrubicin (ADRIAMYCIN) 2 MG/ML injection Inject into the vein every 21 ( twenty-one) days.  Marland Kitchen HYDROcodone-acetaminophen (NORCO) 5-325 MG tablet Take 1 tablet by mouth every 4 (four) hours as needed for moderate pain.  Marland Kitchen lidocaine-prilocaine (EMLA) cream Apply pea-sized amount to port a cath site and cover with plastic wrap one hour prior to each chemotherapy appointment  . Multiple Vitamins-Minerals (ONE-A-DAY MENS HEALTH FORMULA PO) Take 1 tablet by mouth daily.   . predniSONE (DELTASONE) 20 MG tablet Take 5 tablets (100 mg total) by mouth daily. Take on days 1-5 of chemotherapy.  . prochlorperazine (COMPAZINE) 10  MG tablet Take 1 tablet (10 mg total) by mouth every 6 (six) hours as needed (Nausea or vomiting).  . Propylene Glycol (SYSTANE BALANCE) 0.6 % SOLN Place 1 drop into both eyes 2 (two) times daily as needed (dry eyes).  . riTUXimab (RITUXAN IV) Inject into the vein every 21 ( twenty-one) days.  . vinCRIStine 2 mg in sodium chloride 0.9 % 50 mL Inject 2 mg into the vein every 21 ( twenty-one) days.   No facility-administered encounter medications on file as of 06/01/2018.     ALLERGIES:  Allergies  Allergen Reactions  .  Bleomycin Other (See Comments)    Cramping all over     PHYSICAL EXAM:  ECOG Performance status: 0  Vitals:   06/01/18 0811  BP: (!) 152/97  Pulse: 92  Resp: 18  Temp: 97.9 F (36.6 C)  SpO2: 99%   Filed Weights   06/01/18 0811  Weight: 230 lb 6.4 oz (104.5 kg)    Physical Exam Vitals signs reviewed.  Constitutional:      Appearance: Normal appearance. He is normal weight.  HENT:     Head: Normocephalic.     Nose: Nose normal.     Mouth/Throat:     Mouth: Mucous membranes are moist.     Pharynx: Oropharynx is clear.  Eyes:     Extraocular Movements: Extraocular movements intact.     Conjunctiva/sclera: Conjunctivae normal.     Pupils: Pupils are equal, round, and reactive to light.  Neck:     Musculoskeletal: Normal range of motion.  Cardiovascular:     Rate and Rhythm: Normal rate and regular rhythm.     Pulses: Normal pulses.  Pulmonary:     Effort: Pulmonary effort is normal.     Breath sounds: Normal breath sounds.  Abdominal:     General: Abdomen is flat. Bowel sounds are normal.     Palpations: Abdomen is soft.  Skin:    General: Skin is warm and dry.  Neurological:     General: No focal deficit present.     Mental Status: He is alert and oriented to person, place, and time.  Psychiatric:        Mood and Affect: Mood normal.        Behavior: Behavior normal.        Judgment: Judgment normal.      LABORATORY DATA:  I have  reviewed the labs as listed.  CBC    Component Value Date/Time   WBC 5.2 06/01/2018 0757   RBC 5.55 06/01/2018 0757   HGB 15.1 06/01/2018 0757   HGB 16.1 05/03/2018 1553   HCT 45.9 06/01/2018 0757   HCT 49.8 05/03/2018 1553   PLT 211 06/01/2018 0757   PLT 280 05/03/2018 1553   MCV 82.7 06/01/2018 0757   MCV 82 05/03/2018 1553   MCH 27.2 06/01/2018 0757   MCHC 32.9 06/01/2018 0757   RDW 14.4 06/01/2018 0757   RDW 14.4 05/03/2018 1553   LYMPHSABS 2.6 06/01/2018 0757   LYMPHSABS 4.5 (H) 05/03/2018 1553   MONOABS 0.4 06/01/2018 0757   EOSABS 0.1 06/01/2018 0757   EOSABS 0.2 05/03/2018 1553   BASOSABS 0.1 06/01/2018 0757   BASOSABS 0.1 05/03/2018 1553   CMP Latest Ref Rng & Units 06/01/2018 05/06/2018 05/03/2018  Glucose 70 - 99 mg/dL 205(H) - 97  BUN 6 - 20 mg/dL 15 - 16  Creatinine 0.61 - 1.24 mg/dL 0.98 - 1.18  Sodium 135 - 145 mmol/L 138 - 144  Potassium 3.5 - 5.1 mmol/L 3.4(L) - 3.8  Chloride 98 - 111 mmol/L 104 - 101  CO2 22 - 32 mmol/L 22 - 23  Calcium 8.9 - 10.3 mg/dL 9.4 - 10.1  Total Protein 6.5 - 8.1 g/dL 7.8 8.2(H) 7.4  Total Bilirubin 0.3 - 1.2 mg/dL 0.9 0.9 0.4  Alkaline Phos 38 - 126 U/L 66 70 78  AST 15 - 41 U/L _0 ALT 0 - 44 U/L 31 32 25       DIAGNOSTIC IMAGING:  I have independently reviewed the scans and discussed with the patient.  I have reviewed Venita Lick LPN's note and agree with the documentation.  I personally performed a face-to-face visit, made revisions and my assessment and plan is as follows.    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  He also underwent splenectomy. -Recurrence in April 1992, treated with CCNU, etoposide and cis-platinum with 6 cycles -Presentation with right axillary mass for the past 2  months.  Night sweats for the past 2 months.  Denies any fevers or weight loss.  - CT of the chest on 05/04/2018 shows 3.2 cm right axillary lymph node along with multiple small lymph nodes.  Several left axillary lymph node measuring up to 2.8 cm.  No mediastinal or hilar adenopathy.  There is supraclavicular adenopathy. - Family history significant for mother with breast cancer and father with lung cancer who was a smoker. -PET/CT scan on 05/12/2018 shows hypermetabolic lymphadenopathy in the bilateral axillary and subpectoral region, right subclavicular region and portacaval space. -Biopsy of the right axillary lymph node on 05/10/2018 shows B-cell lymphoma phenotypically consistent with nodular lymphocyte predominant Hodgkin's lymphoma. -LDH and beta-2 microglobulin were normal.  Hepatitis B and C panel is negative.  Uric acid is normal. -PET scan on 05/12/2018 shows hypermetabolic lymphadenopathy in the bilateral axillary and subpectoral regions, right supraclavicular region and portacaval space. -Echocardiogram shows EF of 55 to 60%. - Pathology report was discussed in detail.  R-CHOP chemotherapy was recommended. -He has likely received Adriamycin 200 mg per metered square when he had his first chemotherapy in 1989.  We will track his cumulative doses.  Will consider dexrazoxane once his cumulative dose reaches about 350 mg per metered square.  We will try to limit his Adriamycin total exposure to 550 mg per metered square. -Consider referral for transplant at [facility]. -Continue allopurinol 300 mg daily. --Proceed with Cycle 1 RCHOP today.   2.  Postsplenectomy state: -He received flu shot this past year. -Received Menactra and Pneumovax on 05/25/2018.  Total time spent is 40 minutes with more than 50% of the time spent face-to-face discussing chemotherapy side effects and coordination of care.    Orders placed this encounter:  No orders of the defined types were placed in this encounter.      Derek Jack, MD [place] [phone]

## 2018-06-01 NOTE — Patient Instructions (Addendum)
[facility] Discharge Instructions for Patients Receiving Chemotherapy   Beginning January 23rd 2017 lab work for the [facility] will be done in the  Main lab at [facility] on 1st floor. If you have a lab appointment with the [facility] please come in thru the  Main Entrance and check in at the main information desk   Today you received the following chemotherapy agents Rituxan,Adriamycin,Cytoxan and Vincristine as well as Neulasta on-pro. Follow-up as scheduled. Call clinic for any questions or concerns  To help prevent nausea and vomiting after your treatment, we encourage you to take your nausea medication   If you develop nausea and vomiting, or diarrhea that is not controlled by your medication, call the clinic.  The clinic phone number is [phone]. Office hours are Monday-Friday 8:30am-5:00pm.  BELOW ARE SYMPTOMS THAT SHOULD BE REPORTED IMMEDIATELY:  *FEVER GREATER THAN 101.0 F  *CHILLS WITH OR WITHOUT FEVER  NAUSEA AND VOMITING THAT IS NOT CONTROLLED WITH YOUR NAUSEA MEDICATION  *UNUSUAL SHORTNESS OF BREATH  *UNUSUAL BRUISING OR BLEEDING  TENDERNESS IN MOUTH AND THROAT WITH OR WITHOUT PRESENCE OF ULCERS  *URINARY PROBLEMS  *BOWEL PROBLEMS  UNUSUAL RASH Items with * indicate a potential emergency and should be followed up as soon as possible. If you have an emergency after office hours please contact your primary care physician or go to the nearest emergency department.  Please call the clinic during office hours if you have any questions or concerns.   You may also contact the Patient Navigator at [phone] should you have any questions or need assistance in obtaining follow up care.      Resources For Cancer Patients and their Caregivers ? American Cancer Society: Can assist with transportation, wigs, general needs, runs Look Good Feel Better.        339-813-4627 ? Cancer Care: Provides financial assistance, online support  groups, medication/co-pay assistance.  1-800-813-HOPE (437)410-6438) ? [facility] Assists [place] cancer patients and their families through emotional , educational and financial support.  (618)104-0219 ? [place] DSS Where to apply for food stamps, Medicaid and utility assistance. (239)443-3873 ? [facility]: Transportation to medical appointments. 843-017-8165 ? Social Security Administration: May apply for disability if have a Stage IV cancer. [phone] 610-297-3522 ? [employer], Disability and Transit Services: Assists with nutrition, care and transit needs. [phone]

## 2018-06-01 NOTE — Progress Notes (Signed)
Treatment given today per MD orders. Tolerated infusion without adverse affects. Vital signs stable. No complaints at this time. Discharged from clinic ambulatory. F/U with [facility] as scheduled.   

## 2018-06-01 NOTE — Assessment & Plan Note (Addendum)
1.  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  He also underwent splenectomy. -Recurrence in April 1992, treated with CCNU, etoposide and cis-platinum with 6 cycles -Presentation with right axillary mass for the past 2 months.  Night sweats for the past 2 months.  Denies any fevers or weight loss.  - CT of the chest on 05/04/2018 shows 3.2 cm right axillary lymph node along with multiple small lymph nodes.  Several left axillary lymph node measuring up to 2.8 cm.  No mediastinal or hilar adenopathy.  There is supraclavicular adenopathy. - Family history significant for mother with breast cancer and father with lung cancer who was a smoker. -PET/CT scan on 05/12/2018 shows hypermetabolic lymphadenopathy in the bilateral axillary and subpectoral region, right subclavicular region and portacaval space. -Biopsy of the right axillary lymph node on 05/10/2018 shows B-cell lymphoma phenotypically consistent with nodular lymphocyte predominant Hodgkin's lymphoma. -LDH and beta-2 microglobulin were normal.  Hepatitis B and C panel is negative.  Uric acid is normal. -PET scan on 05/12/2018 shows hypermetabolic lymphadenopathy in the bilateral axillary and subpectoral regions, right supraclavicular region and portacaval space. -Echocardiogram shows EF of 55 to 60%. - Pathology report was discussed in detail.  R-CHOP chemotherapy was recommended. -He has likely received Adriamycin 200 mg per metered square when he had his first chemotherapy in 1989.  We will track his cumulative doses.  Will consider dexrazoxane once his cumulative dose reaches about 350 mg per metered square.  We will try to limit his Adriamycin total exposure to 550 mg per metered square. -Consider referral for transplant at [facility]. -Continue allopurinol 300 mg daily. --Proceed with Cycle 1 RCHOP today.   2.  Postsplenectomy  state: -He received flu shot this past year. -Received Menactra and Pneumovax on 05/25/2018.

## 2018-06-01 NOTE — Progress Notes (Signed)
0900: Chemotherapy education packet given and discussed with pt in detail.  Discussed diagnosis and staging, and tx regimen.  Reviewed chemotherapy medications and side effects, as well as pre-medications.  Instructed on how to manage side effects at home, and when to call the clinic.  Importance of fever/chills discussed with pt. Discussed precautions to implement at home after receiving tx, as well as self care strategies. Phone numbers provided for clinic during regular working hours, also how to reach the clinic after hours and on weekends. Pt provided the opportunity to ask questions - all questions answered to pt's satisfaction. Consent for chemotherapy obtained at this time.

## 2018-06-02 ENCOUNTER — Telehealth (HOSPITAL_COMMUNITY): Payer: Self-pay

## 2018-06-02 NOTE — Telephone Encounter (Signed)
24 hr Chemo F/U call: Pt reports he did well last night but has experienced some mild nausea this am. He took his nausea medication which was effective today. Pt denies any fever,chills or pain at this time. Pt instructed to call for any further issues or questions and verbalized understanding

## 2018-06-03 ENCOUNTER — Encounter (HOSPITAL_COMMUNITY): Payer: Self-pay | Admitting: Hematology

## 2018-06-03 NOTE — Progress Notes (Signed)
Faxed current Epic records to Attn: [facility] [place] [phone]. She will contact patient to schedule.  She will contact our office if additional records needed.

## 2018-06-04 ENCOUNTER — Other Ambulatory Visit (HOSPITAL_COMMUNITY): Payer: BLUE CROSS/BLUE SHIELD

## 2018-06-04 ENCOUNTER — Ambulatory Visit (HOSPITAL_COMMUNITY): Payer: BLUE CROSS/BLUE SHIELD | Admitting: Hematology

## 2018-06-04 ENCOUNTER — Inpatient Hospital Stay (HOSPITAL_COMMUNITY): Payer: BLUE CROSS/BLUE SHIELD

## 2018-06-04 ENCOUNTER — Ambulatory Visit (HOSPITAL_COMMUNITY): Payer: BLUE CROSS/BLUE SHIELD

## 2018-06-09 ENCOUNTER — Encounter: Payer: Self-pay | Admitting: *Deleted

## 2018-06-18 ENCOUNTER — Other Ambulatory Visit (HOSPITAL_COMMUNITY): Payer: Self-pay | Admitting: Hematology

## 2018-06-18 DIAGNOSIS — C8108 Nodular lymphocyte predominant Hodgkin lymphoma, lymph nodes of multiple sites: Secondary | ICD-10-CM

## 2018-06-22 ENCOUNTER — Inpatient Hospital Stay (HOSPITAL_COMMUNITY): Payer: BLUE CROSS/BLUE SHIELD

## 2018-06-22 ENCOUNTER — Encounter (HOSPITAL_COMMUNITY): Payer: Self-pay | Admitting: Hematology

## 2018-06-22 ENCOUNTER — Encounter (HOSPITAL_COMMUNITY): Payer: Self-pay

## 2018-06-22 ENCOUNTER — Other Ambulatory Visit: Payer: Self-pay

## 2018-06-22 ENCOUNTER — Inpatient Hospital Stay (HOSPITAL_COMMUNITY): Payer: BLUE CROSS/BLUE SHIELD | Attending: Hematology | Admitting: Hematology

## 2018-06-22 VITALS — BP 115/73 | HR 87 | Temp 98.6°F | Resp 18

## 2018-06-22 VITALS — BP 135/94 | HR 100 | Temp 98.6°F | Resp 18 | Wt 229.8 lb

## 2018-06-22 DIAGNOSIS — C8108 Nodular lymphocyte predominant Hodgkin lymphoma, lymph nodes of multiple sites: Secondary | ICD-10-CM

## 2018-06-22 DIAGNOSIS — Z5111 Encounter for antineoplastic chemotherapy: Secondary | ICD-10-CM | POA: Insufficient documentation

## 2018-06-22 DIAGNOSIS — R8290 Unspecified abnormal findings in urine: Secondary | ICD-10-CM

## 2018-06-22 DIAGNOSIS — Z5189 Encounter for other specified aftercare: Secondary | ICD-10-CM | POA: Insufficient documentation

## 2018-06-22 DIAGNOSIS — Z5112 Encounter for antineoplastic immunotherapy: Secondary | ICD-10-CM | POA: Insufficient documentation

## 2018-06-22 LAB — CBC WITH DIFFERENTIAL/PLATELET
Abs Immature Granulocytes: 0.08 10*3/uL — ABNORMAL HIGH (ref 0.00–0.07)
Basophils Absolute: 0.1 10*3/uL (ref 0.0–0.1)
Basophils Relative: 1 %
Eosinophils Absolute: 0.1 10*3/uL (ref 0.0–0.5)
Eosinophils Relative: 2 %
HCT: 43.7 % (ref 39.0–52.0)
Hemoglobin: 14.4 g/dL (ref 13.0–17.0)
Immature Granulocytes: 1 %
Lymphocytes Relative: 51 %
Lymphs Abs: 4.2 10*3/uL — ABNORMAL HIGH (ref 0.7–4.0)
MCH: 27.3 pg (ref 26.0–34.0)
MCHC: 33 g/dL (ref 30.0–36.0)
MCV: 82.9 fL (ref 80.0–100.0)
Monocytes Absolute: 1 10*3/uL (ref 0.1–1.0)
Monocytes Relative: 13 %
Neutro Abs: 2.6 10*3/uL (ref 1.7–7.7)
Neutrophils Relative %: 32 %
Platelets: 379 10*3/uL (ref 150–400)
RBC: 5.27 MIL/uL (ref 4.22–5.81)
RDW: 15.2 % (ref 11.5–15.5)
WBC: 8.2 10*3/uL (ref 4.0–10.5)
nRBC: 0 % (ref 0.0–0.2)

## 2018-06-22 LAB — COMPREHENSIVE METABOLIC PANEL
ALT: 31 U/L (ref 0–44)
AST: 20 U/L (ref 15–41)
Albumin: 4.3 g/dL (ref 3.5–5.0)
Alkaline Phosphatase: 63 U/L (ref 38–126)
Anion gap: 12 (ref 5–15)
BUN: 17 mg/dL (ref 6–20)
CO2: 24 mmol/L (ref 22–32)
Calcium: 9.3 mg/dL (ref 8.9–10.3)
Chloride: 105 mmol/L (ref 98–111)
Creatinine, Ser: 1.04 mg/dL (ref 0.61–1.24)
GFR calc Af Amer: >60 mL/min (ref >60)
GFR calc non Af Amer: >60 mL/min (ref >60)
Glucose, Bld: 240 mg/dL — ABNORMAL HIGH (ref 70–99)
Potassium: 3.6 mmol/L (ref 3.5–5.1)
Sodium: 141 mmol/L (ref 135–145)
Total Bilirubin: 0.3 mg/dL (ref 0.3–1.2)
Total Protein: 7.2 g/dL (ref 6.5–8.1)

## 2018-06-22 MED ORDER — SODIUM CHLORIDE 0.9 % IV SOLN
10.0000 mg | Freq: Once | INTRAVENOUS | Status: AC
  Administered 2018-06-22: 10 mg via INTRAVENOUS
  Filled 2018-06-22: qty 10

## 2018-06-22 MED ORDER — FAMOTIDINE IN NACL 20-0.9 MG/50ML-% IV SOLN
20.0000 mg | Freq: Once | INTRAVENOUS | Status: AC
  Administered 2018-06-22: 09:00:00 20 mg via INTRAVENOUS
  Filled 2018-06-22: qty 50

## 2018-06-22 MED ORDER — DIPHENHYDRAMINE HCL 25 MG PO CAPS
50.0000 mg | ORAL_CAPSULE | Freq: Once | ORAL | Status: AC
  Administered 2018-06-22: 09:00:00 50 mg via ORAL
  Filled 2018-06-22: qty 2

## 2018-06-22 MED ORDER — DOXORUBICIN HCL CHEMO IV INJECTION 2 MG/ML
50.0000 mg/m2 | Freq: Once | INTRAVENOUS | Status: AC
  Administered 2018-06-22: 11:00:00 118 mg via INTRAVENOUS
  Filled 2018-06-22: qty 59

## 2018-06-22 MED ORDER — HEPARIN SOD (PORK) LOCK FLUSH 100 UNIT/ML IV SOLN
500.0000 [IU] | Freq: Once | INTRAVENOUS | Status: AC | PRN
  Administered 2018-06-22: 500 [IU]

## 2018-06-22 MED ORDER — PALONOSETRON HCL INJECTION 0.25 MG/5ML
0.2500 mg | Freq: Once | INTRAVENOUS | Status: AC
  Administered 2018-06-22: 09:00:00 0.25 mg via INTRAVENOUS
  Filled 2018-06-22: qty 5

## 2018-06-22 MED ORDER — ACETAMINOPHEN 325 MG PO TABS
650.0000 mg | ORAL_TABLET | Freq: Once | ORAL | Status: AC
  Administered 2018-06-22: 09:00:00 650 mg via ORAL
  Filled 2018-06-22: qty 2

## 2018-06-22 MED ORDER — SODIUM CHLORIDE 0.9 % IV SOLN
Freq: Once | INTRAVENOUS | Status: AC
  Administered 2018-06-22: 09:00:00 via INTRAVENOUS

## 2018-06-22 MED ORDER — PEGFILGRASTIM 6 MG/0.6ML ~~LOC~~ PSKT
6.0000 mg | PREFILLED_SYRINGE | Freq: Once | SUBCUTANEOUS | Status: AC
  Administered 2018-06-22: 14:00:00 6 mg via SUBCUTANEOUS
  Filled 2018-06-22: qty 0.6

## 2018-06-22 MED ORDER — SODIUM CHLORIDE 0.9 % IV SOLN
750.0000 mg/m2 | Freq: Once | INTRAVENOUS | Status: AC
  Administered 2018-06-22: 12:00:00 1760 mg via INTRAVENOUS
  Filled 2018-06-22: qty 88

## 2018-06-22 MED ORDER — SODIUM CHLORIDE 0.9% FLUSH
10.0000 mL | INTRAVENOUS | Status: DC | PRN
  Administered 2018-06-22: 08:00:00 10 mL
  Filled 2018-06-22: qty 10

## 2018-06-22 MED ORDER — SODIUM CHLORIDE 0.9 % IV SOLN
375.0000 mg/m2 | Freq: Once | INTRAVENOUS | Status: AC
  Administered 2018-06-22: 13:00:00 900 mg via INTRAVENOUS
  Filled 2018-06-22: qty 50

## 2018-06-22 MED ORDER — VINCRISTINE SULFATE CHEMO INJECTION 1 MG/ML
2.0000 mg | Freq: Once | INTRAVENOUS | Status: AC
  Administered 2018-06-22: 11:00:00 2 mg via INTRAVENOUS
  Filled 2018-06-22: qty 2

## 2018-06-22 NOTE — Progress Notes (Signed)
Patient for treatment today.  Stated pinkish urine from last treatment that only lasted for a couple of days. Denied foul odor, clots, or burning with urination with first treatment or today.  Patient felt like he may have not drank enough water with last treatment.  Dr. Delton Coombes notified.   Patient seen by Dr. Delton Coombes with lab review.  Ok for treatment today verbal order Dr. Delton Coombes.  Patient instructed by the oncologist to increase his water intake to 3070ml to keep his bladder flushed.  Patient verbalized understanding.    Neulasta Onpro applied to arm with green indicator light flashing.  Reviewed instructions and card given with Onpro instructions.  Patient verbalized understanding.  Patient tolerated chemotherapy with no complaints voiced.  Port site clean and dry with no bruising or swelling noted at site.  Good blood return noted before and after administration of chemotherapy.  Band aid applied.  Patient left ambulatory with VSS and no s/s of distress noted.

## 2018-06-22 NOTE — Progress Notes (Signed)
[facility] [place], [place] [address]   CLINIC:  Medical Oncology/Hematology  PCP:  [name], MD [place] [address] [phone]   REASON FOR VISIT:  Follow-up for Stage IV NLP HL   BRIEF ONCOLOGIC HISTORY:    Nodular lymphocyte predom Hodgkin lymphoma lymph nodes multiple sites ([place])   05/25/2018 Initial Diagnosis    Nodular lymphocyte predom Hodgkin lymphoma lymph nodes multiple sites ([place])    06/01/2018 -  Chemotherapy    The patient had DOXOrubicin (ADRIAMYCIN) chemo injection 118 mg, 50 mg/m2 = 118 mg, Intravenous,  Once, 2 of 6 cycles Administration: 118 mg (06/01/2018) palonosetron (ALOXI) injection 0.25 mg, 0.25 mg, Intravenous,  Once, 2 of 6 cycles Administration: 0.25 mg (06/01/2018) pegfilgrastim (NEULASTA ONPRO KIT) injection 6 mg, 6 mg, Subcutaneous, Once, 2 of 6 cycles Administration: 6 mg (06/01/2018) vinCRIStine (ONCOVIN) 2 mg in sodium chloride 0.9 % 50 mL chemo infusion, 2 mg, Intravenous,  Once, 2 of 6 cycles Administration: 2 mg (06/01/2018) riTUXimab (RITUXAN) 900 mg in sodium chloride 0.9 % 250 mL (2.6471 mg/mL) infusion, 375 mg/m2 = 900 mg, Intravenous,  Once, 1 of 1 cycle Administration: 900 mg (06/01/2018) cyclophosphamide (CYTOXAN) 1,760 mg in sodium chloride 0.9 % 250 mL chemo infusion, 750 mg/m2 = 1,760 mg, Intravenous,  Once, 2 of 6 cycles Administration: 1,760 mg (06/01/2018) riTUXimab (RITUXAN) 900 mg in sodium chloride 0.9 % 160 mL infusion, 375 mg/m2 = 900 mg (100 % of original dose 375 mg/m2), Intravenous,  Once, 1 of 5 cycles Dose modification: 375 mg/m2 (original dose 375 mg/m2, Cycle 2)  for chemotherapy treatment.       CANCER STAGING: Cancer Staging No matching staging information was found for the patient.   INTERVAL HISTORY:  Devin Tran 53 y.o. male returns for routine follow-up and consideration for next cycle of chemotherapy. He is here today alone. He states that he has noticed chills  at night. He states that he has some blurred vision since his last visit. He also notes that he had pink tinge to urine. He states that he did experience some constipation but nothing terrible, he was educated on how to prevent this in the future. Denies any nausea, vomiting, or diarrhea. Denies any new pains. Had not noticed any recent bleeding such as epistaxis, hematuria or hematochezia. Denies recent chest pain on exertion, shortness of breath on minimal exertion, pre-syncopal episodes, or palpitations. Denies any numbness or tingling in hands or feet. Denies any recent fevers, infections, or recent hospitalizations. Patient reports appetite at 75% and energy level at 75%.     REVIEW OF SYSTEMS:  Review of Systems  All other systems reviewed and are negative.    PAST MEDICAL/SURGICAL HISTORY:  Past Medical History:  Diagnosis Date  . Complication of anesthesia   . Erectile dysfunction   . Hodgkin's lymphoma ([place])   . Hyperlipidemia   . Hypertension   . Infertility   . Low testosterone   . PONV (postoperative nausea and vomiting)    Past Surgical History:  Procedure Laterality Date  . AXILLARY LYMPH NODE BIOPSY Right 05/10/2018   Procedure: AXILLARY LYMPH NODE BIOPSY, RIGHT;  Surgeon: Aviva Signs, MD;  Location: [facility];  Service: General;  Laterality: Right;  . IR LYMPHANGIOGRAM EXTREMITY UNI LEFT  03/02/1987  . PORTA CATH INSERTION    . PORTA CATH REMOVAL    . PORTACATH PLACEMENT Left 05/21/2018   Procedure: INSERTION PORT-A-CATH (attached catheter in left subclavian);  Surgeon: Aviva Signs, MD;  Location: [facility];  Service: General;  Laterality: Left;  . SPLENECTOMY Left Over 20 years ago   Due to Hodgkin's lymphoma     SOCIAL HISTORY:  Social History   Socioeconomic History  . Marital status: Single    Spouse name: Not on file  . Number of children: Not on file  . Years of education: Not on file  . Highest education level: Not on file  Occupational History  .  Occupation: Drive Chief Operating Officer: [employer]  . Financial resource strain: Not on file  . Food insecurity:    Worry: Not on file    Inability: Not on file  . Transportation needs:    Medical: Not on file    Non-medical: Not on file  Tobacco Use  . Smoking status: Never Smoker  . Smokeless tobacco: Never Used  Substance and Sexual Activity  . Alcohol use: No  . Drug use: No  . Sexual activity: Yes  Lifestyle  . Physical activity:    Days per week: Not on file    Minutes per session: Not on file  . Stress: Not on file  Relationships  . Social connections:    Talks on phone: Not on file    Gets together: Not on file    Attends religious service: Not on file    Active member of club or organization: Not on file    Attends meetings of clubs or organizations: Not on file    Relationship status: Not on file  . Intimate partner violence:    Fear of current or ex partner: Not on file    Emotionally abused: Not on file    Physically abused: Not on file    Forced sexual activity: Not on file  Other Topics Concern  . Not on file  Social History Narrative  . Not on file    FAMILY HISTORY:  Family History  Problem Relation Age of Onset  . Hypertension Mother   . Cancer Father        lung  . Hypertension Father   . Diabetes Father   . COPD Father   . Hypertension Brother   . Stroke Brother     CURRENT MEDICATIONS:  Outpatient Encounter Medications as of 06/22/2018  Medication Sig  . allopurinol (ZYLOPRIM) 300 MG tablet Take 1 tablet (300 mg total) by mouth daily.  Marland Kitchen amLODipine (NORVASC) 10 MG tablet TAKE 1 TABLET BY MOUTH EVERY DAY (Patient taking differently: Take 5 mg by mouth daily. TAKE 1 TABLET BY MOUTH EVERY DAY)  . Ascorbic Acid (VITAMIN C) 1000 MG tablet Take 1,000 mg by mouth daily. Vitamin c lozenge  . aspirin EC 81 MG tablet Take 81 mg by mouth daily.   . CYCLOPHOSPHAMIDE IV Inject into the vein every 21 ( twenty-one) days.  Marland Kitchen  DOXOrubicin (ADRIAMYCIN) 2 MG/ML injection Inject into the vein every 21 ( twenty-one) days.  Marland Kitchen HYDROcodone-acetaminophen (NORCO) 5-325 MG tablet Take 1 tablet by mouth every 4 (four) hours as needed for moderate pain.  Marland Kitchen lidocaine-prilocaine (EMLA) cream Apply pea-sized amount to port a cath site and cover with plastic wrap one hour prior to each chemotherapy appointment  . Multiple Vitamins-Minerals (ONE-A-DAY MENS HEALTH FORMULA PO) Take 1 tablet by mouth daily.   . predniSONE (DELTASONE) 20 MG tablet Take 5 tablets (100 mg total) by mouth daily. Take on days 1-5 of chemotherapy.  . prochlorperazine (COMPAZINE) 10 MG tablet  Take 1 tablet (10 mg total) by mouth every 6 (six) hours as needed (Nausea or vomiting).  . Propylene Glycol (SYSTANE BALANCE) 0.6 % SOLN Place 1 drop into both eyes 2 (two) times daily as needed (dry eyes).  . riTUXimab (RITUXAN IV) Inject into the vein every 21 ( twenty-one) days.  . vinCRIStine 2 mg in sodium chloride 0.9 % 50 mL Inject 2 mg into the vein every 21 ( twenty-one) days.   No facility-administered encounter medications on file as of 06/22/2018.     ALLERGIES:  Allergies  Allergen Reactions  . Bleomycin Other (See Comments)    Cramping all over     PHYSICAL EXAM:  ECOG Performance status: 1  Vitals:   06/22/18 0803  BP: (!) 135/94  Pulse: 100  Resp: 18  Temp: 98.6 F (37 C)  SpO2: 97%   Filed Weights   06/22/18 0803  Weight: 229 lb 12.8 oz (104.2 kg)    Physical Exam Vitals signs reviewed.  Constitutional:      Appearance: Normal appearance.  Cardiovascular:     Rate and Rhythm: Normal rate and regular rhythm.     Heart sounds: Normal heart sounds.  Pulmonary:     Effort: Pulmonary effort is normal.     Breath sounds: Normal breath sounds.  Abdominal:     General: There is no distension.     Palpations: Abdomen is soft. There is no mass.  Musculoskeletal:        General: No swelling.  Lymphadenopathy:     Cervical: No cervical  adenopathy.  Skin:    General: Skin is warm.  Neurological:     General: No focal deficit present.     Mental Status: He is alert and oriented to person, place, and time.  Psychiatric:        Mood and Affect: Mood normal.        Behavior: Behavior normal.      LABORATORY DATA:  I have reviewed the labs as listed.  CBC    Component Value Date/Time   WBC 8.2 06/22/2018 0803   RBC 5.27 06/22/2018 0803   HGB 14.4 06/22/2018 0803   HGB 16.1 05/03/2018 1553   HCT 43.7 06/22/2018 0803   HCT 49.8 05/03/2018 1553   PLT 379 06/22/2018 0803   PLT 280 05/03/2018 1553   MCV 82.9 06/22/2018 0803   MCV 82 05/03/2018 1553   MCH 27.3 06/22/2018 0803   MCHC 33.0 06/22/2018 0803   RDW 15.2 06/22/2018 0803   RDW 14.4 05/03/2018 1553   LYMPHSABS 4.2 (H) 06/22/2018 0803   LYMPHSABS 4.5 (H) 05/03/2018 1553   MONOABS 1.0 06/22/2018 0803   EOSABS 0.1 06/22/2018 0803   EOSABS 0.2 05/03/2018 1553   BASOSABS 0.1 06/22/2018 0803   BASOSABS 0.1 05/03/2018 1553   CMP Latest Ref Rng & Units 06/22/2018 06/01/2018 05/06/2018  Glucose 70 - 99 mg/dL 240(H) 205(H) -  BUN 6 - 20 mg/dL 17 15 -  Creatinine 0.61 - 1.24 mg/dL 1.04 0.98 -  Sodium 135 - 145 mmol/L 141 138 -  Potassium 3.5 - 5.1 mmol/L 3.6 3.4(L) -  Chloride 98 - 111 mmol/L 105 104 -  CO2 22 - 32 mmol/L 24 22 -  Calcium 8.9 - 10.3 mg/dL 9.3 9.4 -  Total Protein 6.5 - 8.1 g/dL 7.2 7.8 8.2(H)  Total Bilirubin 0.3 - 1.2 mg/dL 0.3 0.9 0.9  Alkaline Phos 38 - 126 U/L 63 66 70  AST 15 - 41 U/L 20  20 24  ALT 0 - 44 U/L 31 31 32       DIAGNOSTIC IMAGING:  I have independently reviewed the scans and discussed with the patient.   I have reviewed Venita Lick LPN's note and agree with the documentation.  I personally performed a face-to-face visit, made revisions and my assessment and plan is as follows.    ASSESSMENT & PLAN:   Nodular lymphocyte predom Hodgkin lymphoma lymph nodes multiple sites ([place]) 1.  Nodular lymphocyte predominant  Hodgkin's lymphoma (recurrent 3B): -History of stage IV NLP HL with liver involvement diagnosed in 1989, treated with MOPP-ABVD.  He also underwent splenectomy. -Recurrence in April 1992, treated with CCNU, etoposide and cis-platinum with 6 cycles -Presentation with right axillary mass for the past 2 months.  Night sweats for the past 2 months.  Denies any fevers or weight loss.  - CT of the chest on 05/04/2018 shows 3.2 cm right axillary lymph node along with multiple small lymph nodes.  Several left axillary lymph node measuring up to 2.8 cm.  No mediastinal or hilar adenopathy.  There is supraclavicular adenopathy. - Family history significant for mother with breast cancer and father with lung cancer who was a smoker. -PET/CT scan on 05/12/2018 shows hypermetabolic lymphadenopathy in the bilateral axillary and subpectoral region, right subclavicular region and portacaval space. -Biopsy of the right axillary lymph node on 05/10/2018 shows B-cell lymphoma phenotypically consistent with nodular lymphocyte predominant Hodgkin's lymphoma. -LDH and beta-2 microglobulin were normal.  Hepatitis B and C panel is negative.  Uric acid is normal. -Echocardiogram shows EF of 55 to 60%. -Cycle 1 of R-CHOP on 05/24/2018. -He had blurring of eyesight in the first week.  It could be secondary to prednisone although Compazine can also do that. -His blood sugars have been elevated lately.  He said he would cut back on sugary drinks. -He did report mild pinkish discoloration of his urine during the first week.  He denies any blood in the urine.  I have reinforced him to drink 2 to 3 L of fluids every day. -Physical examination today revealed decrease in size of the right axillary lymph node mass. -He received Adriamycin 200 mg/m2 when he had his first chemotherapy in 1989.  We will consider dexrazoxane once his cumulative doses reach to about 350 mg per metered square.  We will also try to limit his Adriamycin total  exposure to 5 2 mg per metered square. -He was reinforced to keep his appointment with lymphoma specialist at [facility]. -We will continue allopurinol 300 mg daily.  He will proceed with cycle 2 of R-CHOP today. -We will see him back in 3 weeks for follow-up.  I will consider repeating PET CT scan after cycle 3.   2.  Postsplenectomy state: -He received flu shot this past year. -Received Menactra and Pneumovax on 05/25/2018.      Orders placed this encounter:  No orders of the defined types were placed in this encounter.     Derek Jack, MD [place] [phone]

## 2018-06-22 NOTE — Patient Instructions (Addendum)
[place] at [facility] Discharge Instructions  You were seen today by Dr. Delton Coombes. He went over your recent lab results. Make sure you push your fluids and drink 2-3 liters of water a day.  He will see you back in 3 weeks for labs and follow up.   Thank you for choosing [place] at [facility] to provide your oncology and hematology care.  To afford each patient quality time with our provider, please arrive at least 15 minutes before your scheduled appointment time.   If you have a lab appointment with the [facility] please come in thru the  Main Entrance and check in at the main information desk  You need to re-schedule your appointment should you arrive 10 or more minutes late.  We strive to give you quality time with our providers, and arriving late affects you and other patients whose appointments are after yours.  Also, if you no show three or more times for appointments you may be dismissed from the clinic at the providers discretion.     Again, thank you for choosing [facility].  Our hope is that these requests will decrease the amount of time that you wait before being seen by our physicians.       _____________________________________________________________  Should you have questions after your visit to [facility], please contact our office at [phone] between the hours of 8:00 a.m. and 4:30 p.m.  Voicemails left after 4:00 p.m. will not be returned until the following business day.  For prescription refill requests, have your pharmacy contact our office and allow 72 hours.    Cancer Center Support Programs:   > Cancer Support Group  2nd Tuesday of the month 1pm-2pm, Journey Room

## 2018-06-22 NOTE — Assessment & Plan Note (Addendum)
1.  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  He also underwent splenectomy. -Recurrence in April 1992, treated with CCNU, etoposide and cis-platinum with 6 cycles -Presentation with right axillary mass for the past 2 months.  Night sweats for the past 2 months.  Denies any fevers or weight loss.  - CT of the chest on 05/04/2018 shows 3.2 cm right axillary lymph node along with multiple small lymph nodes.  Several left axillary lymph node measuring up to 2.8 cm.  No mediastinal or hilar adenopathy.  There is supraclavicular adenopathy. - Family history significant for mother with breast cancer and father with lung cancer who was a smoker. -PET/CT scan on 05/12/2018 shows hypermetabolic lymphadenopathy in the bilateral axillary and subpectoral region, right subclavicular region and portacaval space. -Biopsy of the right axillary lymph node on 05/10/2018 shows B-cell lymphoma phenotypically consistent with nodular lymphocyte predominant Hodgkin's lymphoma. -LDH and beta-2 microglobulin were normal.  Hepatitis B and C panel is negative.  Uric acid is normal. -Echocardiogram shows EF of 55 to 60%. -Cycle 1 of R-CHOP on 05/24/2018. -He had blurring of eyesight in the first week.  It could be secondary to prednisone although Compazine can also do that. -His blood sugars have been elevated lately.  He said he would cut back on sugary drinks. -He did report mild pinkish discoloration of his urine during the first week.  He denies any blood in the urine.  I have reinforced him to drink 2 to 3 L of fluids every day. -Physical examination today revealed decrease in size of the right axillary lymph node mass. -He received Adriamycin 200 mg/m2 when he had his first chemotherapy in 1989.  We will consider dexrazoxane once his cumulative doses reach to about 350 mg per metered square.  We will also try to  limit his Adriamycin total exposure to 5 2 mg per metered square. -He was reinforced to keep his appointment with lymphoma specialist at [facility]. -We will continue allopurinol 300 mg daily.  He will proceed with cycle 2 of R-CHOP today. -We will see him back in 3 weeks for follow-up.  I will consider repeating PET CT scan after cycle 3.   2.  Postsplenectomy state: -He received flu shot this past year. -Received Menactra and Pneumovax on 05/25/2018.

## 2018-06-24 ENCOUNTER — Telehealth: Payer: Self-pay | Admitting: Family Medicine

## 2018-06-24 ENCOUNTER — Other Ambulatory Visit (HOSPITAL_COMMUNITY): Payer: Self-pay | Admitting: *Deleted

## 2018-06-24 DIAGNOSIS — C8108 Nodular lymphocyte predominant Hodgkin lymphoma, lymph nodes of multiple sites: Secondary | ICD-10-CM

## 2018-06-24 MED ORDER — ALLOPURINOL 300 MG PO TABS: 300.0000 mg | ORAL_TABLET | Freq: Every day | ORAL | 1 refills | Status: DC

## 2018-07-13 ENCOUNTER — Encounter (HOSPITAL_COMMUNITY): Payer: Self-pay | Admitting: Hematology

## 2018-07-13 ENCOUNTER — Inpatient Hospital Stay (HOSPITAL_BASED_OUTPATIENT_CLINIC_OR_DEPARTMENT_OTHER): Payer: BC Managed Care – PPO | Admitting: Hematology

## 2018-07-13 ENCOUNTER — Inpatient Hospital Stay (HOSPITAL_COMMUNITY): Payer: BC Managed Care – PPO | Attending: Hematology

## 2018-07-13 ENCOUNTER — Encounter (HOSPITAL_COMMUNITY): Payer: Self-pay

## 2018-07-13 ENCOUNTER — Other Ambulatory Visit: Payer: Self-pay

## 2018-07-13 ENCOUNTER — Inpatient Hospital Stay (HOSPITAL_COMMUNITY): Payer: BC Managed Care – PPO

## 2018-07-13 VITALS — BP 122/78 | HR 86 | Temp 98.1°F | Resp 18

## 2018-07-13 VITALS — BP 140/91 | HR 92 | Temp 98.6°F | Resp 18 | Wt 232.0 lb

## 2018-07-13 DIAGNOSIS — Z5112 Encounter for antineoplastic immunotherapy: Secondary | ICD-10-CM | POA: Diagnosis not present

## 2018-07-13 DIAGNOSIS — E1165 Type 2 diabetes mellitus with hyperglycemia: Secondary | ICD-10-CM | POA: Diagnosis not present

## 2018-07-13 DIAGNOSIS — Z5189 Encounter for other specified aftercare: Secondary | ICD-10-CM | POA: Insufficient documentation

## 2018-07-13 DIAGNOSIS — C8108 Nodular lymphocyte predominant Hodgkin lymphoma, lymph nodes of multiple sites: Secondary | ICD-10-CM

## 2018-07-13 DIAGNOSIS — Z5111 Encounter for antineoplastic chemotherapy: Secondary | ICD-10-CM | POA: Diagnosis not present

## 2018-07-13 DIAGNOSIS — R739 Hyperglycemia, unspecified: Secondary | ICD-10-CM

## 2018-07-13 LAB — COMPREHENSIVE METABOLIC PANEL
ALT: 35 U/L (ref 0–44)
AST: 20 U/L (ref 15–41)
Albumin: 4.3 g/dL (ref 3.5–5.0)
Alkaline Phosphatase: 60 U/L (ref 38–126)
Anion gap: 13 (ref 5–15)
BUN: 13 mg/dL (ref 6–20)
CO2: 23 mmol/L (ref 22–32)
Calcium: 9.1 mg/dL (ref 8.9–10.3)
Chloride: 103 mmol/L (ref 98–111)
Creatinine, Ser: 1.07 mg/dL (ref 0.61–1.24)
GFR calc Af Amer: >60 mL/min (ref >60)
GFR calc non Af Amer: >60 mL/min (ref >60)
Glucose, Bld: 275 mg/dL — ABNORMAL HIGH (ref 70–99)
Potassium: 3.7 mmol/L (ref 3.5–5.1)
Sodium: 139 mmol/L (ref 135–145)
Total Bilirubin: 0.6 mg/dL (ref 0.3–1.2)
Total Protein: 7 g/dL (ref 6.5–8.1)

## 2018-07-13 LAB — CBC WITH DIFFERENTIAL/PLATELET
Abs Immature Granulocytes: 0.05 10*3/uL (ref 0.00–0.07)
Basophils Absolute: 0.1 10*3/uL (ref 0.0–0.1)
Basophils Relative: 1 %
Eosinophils Absolute: 0.3 10*3/uL (ref 0.0–0.5)
Eosinophils Relative: 4 %
HCT: 40.5 % (ref 39.0–52.0)
Hemoglobin: 13.1 g/dL (ref 13.0–17.0)
Immature Granulocytes: 1 %
Lymphocytes Relative: 42 %
Lymphs Abs: 2.7 10*3/uL (ref 0.7–4.0)
MCH: 27.1 pg (ref 26.0–34.0)
MCHC: 32.3 g/dL (ref 30.0–36.0)
MCV: 83.7 fL (ref 80.0–100.0)
Monocytes Absolute: 1 10*3/uL (ref 0.1–1.0)
Monocytes Relative: 15 %
Neutro Abs: 2.4 10*3/uL (ref 1.7–7.7)
Neutrophils Relative %: 37 %
Platelets: 304 10*3/uL (ref 150–400)
RBC: 4.84 MIL/uL (ref 4.22–5.81)
RDW: 15.9 % — ABNORMAL HIGH (ref 11.5–15.5)
WBC: 6.5 10*3/uL (ref 4.0–10.5)
nRBC: 0 % (ref 0.0–0.2)

## 2018-07-13 LAB — HEMOGLOBIN A1C
Hgb A1c MFr Bld: 8.6 % — ABNORMAL HIGH (ref 4.8–5.6)
Mean Plasma Glucose: 200.12 mg/dL

## 2018-07-13 MED ORDER — ACETAMINOPHEN 325 MG PO TABS
650.0000 mg | ORAL_TABLET | Freq: Once | ORAL | Status: AC
  Administered 2018-07-13: 650 mg via ORAL
  Filled 2018-07-13: qty 2

## 2018-07-13 MED ORDER — DIPHENHYDRAMINE HCL 25 MG PO CAPS
50.0000 mg | ORAL_CAPSULE | Freq: Once | ORAL | Status: AC
  Administered 2018-07-13: 09:00:00 50 mg via ORAL
  Filled 2018-07-13: qty 2

## 2018-07-13 MED ORDER — FAMOTIDINE IN NACL 20-0.9 MG/50ML-% IV SOLN
20.0000 mg | Freq: Once | INTRAVENOUS | Status: AC
  Administered 2018-07-13: 09:00:00 20 mg via INTRAVENOUS
  Filled 2018-07-13: qty 50

## 2018-07-13 MED ORDER — VINCRISTINE SULFATE CHEMO INJECTION 1 MG/ML
2.0000 mg | Freq: Once | INTRAVENOUS | Status: AC
  Administered 2018-07-13: 2 mg via INTRAVENOUS
  Filled 2018-07-13: qty 2

## 2018-07-13 MED ORDER — DOXORUBICIN HCL CHEMO IV INJECTION 2 MG/ML
50.0000 mg/m2 | Freq: Once | INTRAVENOUS | Status: AC
  Administered 2018-07-13: 12:00:00 118 mg via INTRAVENOUS
  Filled 2018-07-13: qty 59

## 2018-07-13 MED ORDER — SODIUM CHLORIDE 0.9 % IV SOLN
10.0000 mg | Freq: Once | INTRAVENOUS | Status: AC
  Administered 2018-07-13: 10 mg via INTRAVENOUS
  Filled 2018-07-13: qty 10

## 2018-07-13 MED ORDER — HEPARIN SOD (PORK) LOCK FLUSH 100 UNIT/ML IV SOLN
500.0000 [IU] | Freq: Once | INTRAVENOUS | Status: AC | PRN
  Administered 2018-07-13: 500 [IU]

## 2018-07-13 MED ORDER — PEGFILGRASTIM 6 MG/0.6ML ~~LOC~~ PSKT
6.0000 mg | PREFILLED_SYRINGE | Freq: Once | SUBCUTANEOUS | Status: AC
  Administered 2018-07-13: 6 mg via SUBCUTANEOUS
  Filled 2018-07-13: qty 0.6

## 2018-07-13 MED ORDER — SODIUM CHLORIDE 0.9 % IV SOLN
375.0000 mg/m2 | Freq: Once | INTRAVENOUS | Status: AC
  Administered 2018-07-13: 10:00:00 900 mg via INTRAVENOUS
  Filled 2018-07-13: qty 40

## 2018-07-13 MED ORDER — PALONOSETRON HCL INJECTION 0.25 MG/5ML
0.2500 mg | Freq: Once | INTRAVENOUS | Status: AC
  Administered 2018-07-13: 0.25 mg via INTRAVENOUS
  Filled 2018-07-13: qty 5

## 2018-07-13 MED ORDER — SODIUM CHLORIDE 0.9 % IV SOLN
Freq: Once | INTRAVENOUS | Status: AC
  Administered 2018-07-13: 09:00:00 via INTRAVENOUS

## 2018-07-13 MED ORDER — SODIUM CHLORIDE 0.9 % IV SOLN
750.0000 mg/m2 | Freq: Once | INTRAVENOUS | Status: AC
  Administered 2018-07-13: 1760 mg via INTRAVENOUS
  Filled 2018-07-13: qty 88

## 2018-07-13 MED ORDER — SODIUM CHLORIDE 0.9% FLUSH
10.0000 mL | INTRAVENOUS | Status: DC | PRN
  Administered 2018-07-13: 10 mL
  Filled 2018-07-13: qty 10

## 2018-07-13 NOTE — Progress Notes (Signed)
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 ([place])    06/01/2018 -  Chemotherapy    The patient had DOXOrubicin (ADRIAMYCIN) chemo injection 118 mg, 50 mg/m2 = 118 mg, Intravenous,  Once, 3 of 6 cycles Administration: 118 mg (06/01/2018), 118 mg (06/22/2018) palonosetron (ALOXI) injection 0.25 mg, 0.25 mg, Intravenous,  Once, 3 of 6 cycles Administration: 0.25 mg (06/01/2018), 0.25 mg (06/22/2018) pegfilgrastim (NEULASTA ONPRO KIT) injection 6 mg, 6 mg, Subcutaneous, Once, 3 of 6 cycles Administration: 6 mg (06/01/2018), 6 mg (06/22/2018) vinCRIStine (ONCOVIN) 2 mg in sodium chloride 0.9 % 50 mL chemo infusion, 2 mg, Intravenous,  Once, 3 of 6 cycles Administration: 2 mg (06/01/2018), 2 mg (06/22/2018) riTUXimab (RITUXAN) 900 mg in sodium chloride 0.9 % 250 mL (2.6471 mg/mL) infusion, 375 mg/m2 = 900 mg, Intravenous,  Once, 1 of 1 cycle Administration: 900 mg (06/01/2018) cyclophosphamide (CYTOXAN) 1,760 mg in sodium chloride 0.9 % 250 mL chemo infusion, 750 mg/m2 = 1,760 mg, Intravenous,  Once, 3 of 6 cycles Administration: 1,760 mg (06/01/2018), 1,760 mg (06/22/2018) riTUXimab (RITUXAN) 900 mg in sodium chloride 0.9 % 160 mL infusion, 375 mg/m2 = 900 mg (100 % of original dose 375 mg/m2), Intravenous,  Once, 2 of 5 cycles Dose modification: 375 mg/m2 (original dose 375 mg/m2, Cycle 2) Administration: 900 mg (06/22/2018)  for chemotherapy treatment.       CANCER STAGING: Cancer Staging No matching staging information was found for the patient.   INTERVAL HISTORY:  Mr. Rathgeber 53 y.o. male for  follow-up of an chemotherapy for his lymphoma.  He did receive cycle 2 on 06/22/2018.     REVIEW OF SYSTEMS:  Review of Systems  All other systems reviewed and are negative.    PAST MEDICAL/SURGICAL HISTORY:  Past Medical History:  Diagnosis Date  . Complication of anesthesia   . Erectile dysfunction   . Hodgkin's lymphoma ([place])   . Hyperlipidemia   . Hypertension   . Infertility   . Low testosterone   . PONV (postoperative nausea and vomiting)    Past Surgical History:  Procedure Laterality Date  . AXILLARY LYMPH NODE BIOPSY Right 05/10/2018   Procedure: AXILLARY LYMPH NODE BIOPSY, RIGHT;  Surgeon: Aviva Signs, MD;  Location: [facility];  Service: General;  Laterality: Right;  . IR LYMPHANGIOGRAM EXTREMITY UNI LEFT  03/02/1987  . PORTA CATH INSERTION    . PORTA CATH REMOVAL    . PORTACATH PLACEMENT Left 05/21/2018   Procedure: INSERTION PORT-A-CATH (attached catheter in left subclavian);  Surgeon: Aviva Signs, MD;  Location: [facility];  Service: General;  Laterality: Left;  . SPLENECTOMY Left Over 20 years ago   Due to Hodgkin's lymphoma     SOCIAL HISTORY:  Social History   Socioeconomic History  . Marital status: Single    Spouse name: Not on file  . Number of children: Not on file  . Years of education: Not on file  . Highest education level: Not on file  Occupational History  . Occupation: Drive Chief Operating Officer: [employer]  . Financial  resource strain: Not on file  . Food insecurity:    Worry: Not on file    Inability: Not on file  . Transportation needs:    Medical: Not on file    Non-medical: Not on file  Tobacco Use  . Smoking status: Never Smoker  . Smokeless tobacco: Never Used  Substance and Sexual Activity  . Alcohol use: No  . Drug use: No  . Sexual activity: Yes  Lifestyle  . Physical activity:    Days per week: Not on file    Minutes per session: Not on file  . Stress: Not on file  Relationships  . Social  connections:    Talks on phone: Not on file    Gets together: Not on file    Attends religious service: Not on file    Active member of club or organization: Not on file    Attends meetings of clubs or organizations: Not on file    Relationship status: Not on file  . Intimate partner violence:    Fear of current or ex partner: Not on file    Emotionally abused: Not on file    Physically abused: Not on file    Forced sexual activity: Not on file  Other Topics Concern  . Not on file  Social History Narrative  . Not on file    FAMILY HISTORY:  Family History  Problem Relation Age of Onset  . Hypertension Mother   . Cancer Father        lung  . Hypertension Father   . Diabetes Father   . COPD Father   . Hypertension Brother   . Stroke Brother     CURRENT MEDICATIONS:  Outpatient Encounter Medications as of 07/13/2018  Medication Sig  . allopurinol (ZYLOPRIM) 300 MG tablet Take 1 tablet (300 mg total) by mouth daily.  Marland Kitchen amLODipine (NORVASC) 10 MG tablet TAKE 1 TABLET BY MOUTH EVERY DAY (Patient taking differently: Take 5 mg by mouth daily. TAKE 1 TABLET BY MOUTH EVERY DAY)  . Ascorbic Acid (VITAMIN C) 1000 MG tablet Take 1,000 mg by mouth daily. Vitamin c lozenge  . aspirin EC 81 MG tablet Take 81 mg by mouth daily.   . CYCLOPHOSPHAMIDE IV Inject into the vein every 21 ( twenty-one) days.  Marland Kitchen DOXOrubicin (ADRIAMYCIN) 2 MG/ML injection Inject into the vein every 21 ( twenty-one) days.  Marland Kitchen HYDROcodone-acetaminophen (NORCO) 5-325 MG tablet Take 1 tablet by mouth every 4 (four) hours as needed for moderate pain.  Marland Kitchen lidocaine-prilocaine (EMLA) cream Apply pea-sized amount to port a cath site and cover with plastic wrap one hour prior to each chemotherapy appointment  . Multiple Vitamins-Minerals (ONE-A-DAY MENS HEALTH FORMULA PO) Take 1 tablet by mouth daily.   . predniSONE (DELTASONE) 20 MG tablet Take 5 tablets (100 mg total) by mouth daily. Take on days 1-5 of chemotherapy.  .  prochlorperazine (COMPAZINE) 10 MG tablet Take 1 tablet (10 mg total) by mouth every 6 (six) hours as needed (Nausea or vomiting).  . Propylene Glycol (SYSTANE BALANCE) 0.6 % SOLN Place 1 drop into both eyes 2 (two) times daily as needed (dry eyes).  . riTUXimab (RITUXAN IV) Inject into the vein every 21 ( twenty-one) days.  . vinCRIStine 2 mg in sodium chloride 0.9 % 50 mL Inject 2 mg into the vein every 21 ( twenty-one) days.   No facility-administered encounter medications on file as of 07/13/2018.     ALLERGIES:  Allergies  Allergen Reactions  .  Bleomycin Other (See Comments)    Cramping all over     PHYSICAL EXAM:  ECOG Performance status: 1  Vitals:   07/13/18 0804  BP: (!) 140/91  Pulse: 92  Resp: 18  Temp: 98.6 F (37 C)  SpO2: 99%   Filed Weights   07/13/18 0804  Weight: 232 lb (105.2 kg)    Physical Exam Vitals signs reviewed.  Constitutional:      Appearance: Normal appearance.  Cardiovascular:     Rate and Rhythm: Normal rate and regular rhythm.     Heart sounds: Normal heart sounds.  Pulmonary:     Effort: Pulmonary effort is normal.     Breath sounds: Normal breath sounds.  Abdominal:     General: There is no distension.     Palpations: Abdomen is soft. There is no mass.  Musculoskeletal:        General: No swelling.  Lymphadenopathy:     Cervical: No cervical adenopathy.  Skin:    General: Skin is warm.  Neurological:     General: No focal deficit present.     Mental Status: He is alert and oriented to person, place, and time.  Psychiatric:        Mood and Affect: Mood normal.        Behavior: Behavior normal.      LABORATORY DATA:  I have reviewed the labs as listed.  CBC    Component Value Date/Time   WBC 6.5 07/13/2018 0805   RBC 4.84 07/13/2018 0805   HGB 13.1 07/13/2018 0805   HGB 16.1 05/03/2018 1553   HCT 40.5 07/13/2018 0805   HCT 49.8 05/03/2018 1553   PLT 304 07/13/2018 0805   PLT 280 05/03/2018 1553   MCV 83.7  07/13/2018 0805   MCV 82 05/03/2018 1553   MCH 27.1 07/13/2018 0805   MCHC 32.3 07/13/2018 0805   RDW 15.9 (H) 07/13/2018 0805   RDW 14.4 05/03/2018 1553   LYMPHSABS 2.7 07/13/2018 0805   LYMPHSABS 4.5 (H) 05/03/2018 1553   MONOABS 1.0 07/13/2018 0805   EOSABS 0.3 07/13/2018 0805   EOSABS 0.2 05/03/2018 1553   BASOSABS 0.1 07/13/2018 0805   BASOSABS 0.1 05/03/2018 1553   CMP Latest Ref Rng & Units 07/13/2018 06/22/2018 06/01/2018  Glucose 70 - 99 mg/dL 275(H) 240(H) 205(H)  BUN 6 - 20 mg/dL _0 Creatinine 0.61 - 1.24 mg/dL 1.07 1.04 0.98  Sodium 135 - 145 mmol/L 139 141 138  Potassium 3.5 - 5.1 mmol/L 3.7 3.6 3.4(L)  Chloride 98 - 111 mmol/L 103 105 104  CO2 22 - 32 mmol/L _1 Calcium 8.9 - 10.3 mg/dL 9.1 9.3 9.4  Total Protein 6.5 - 8.1 g/dL 7.0 7.2 7.8  Total Bilirubin 0.3 - 1.2 mg/dL 0.6 0.3 0.9  Alkaline Phos 38 - 126 U/L 60 63 66  AST 15 - 41 U/L _2 ALT 0 - 44 U/L 35 31 31       DIAGNOSTIC IMAGING:  I have independently reviewed the scans and discussed with the patient.   I have reviewed Venita Lick LPN's note and agree with the documentation.  I personally performed a face-to-face visit, made revisions and my assessment and plan is as follows.    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  He also underwent splenectomy. -Recurrence in April 1992, treated with CCNU, etoposide and cis-platinum with 6 cycles -Presentation with right axillary mass for the past 2 months.  Night sweats for the past 2 months.  Denies any fevers or weight loss.  - CT of the chest on 05/04/2018 shows 3.2 cm right axillary lymph node along with multiple small lymph nodes.  Several left axillary lymph node measuring up to 2.8 cm.  No mediastinal or hilar  adenopathy.  There is supraclavicular adenopathy. - Family history significant for mother with breast cancer and father with lung cancer who was a smoker. -PET/CT scan on 05/12/2018 shows hypermetabolic lymphadenopathy in the bilateral axillary and subpectoral region, right subclavicular region and portacaval space. -Biopsy of the right axillary lymph node on 05/10/2018 shows B-cell lymphoma phenotypically consistent with nodular lymphocyte predominant Hodgkin's lymphoma. -LDH and beta-2 microglobulin were normal.  Hepatitis B and C panel is negative.  Uric acid is normal. -Echocardiogram shows EF of 55 to 60%. -2 cycles of R-CHOP from 05/24/2018 through 06/22/2018.  - He did not have any major side effects after cycle 2.  He complained of dry skin and some cracking of the lips at the corners of the mouth. -His blood sugar was elevated at 275.  We will check a hemoglobin A1c level today. -Physical examination showed complete resolution of the right axillary adenopathy. -I have reviewed his labs.  He may proceed with cycle 3 without any dose modifications.  I plan to repeat PET CT scan after this cycle. -He received Adriamycin 200 mg/m2 when he had his first chemotherapy in 1989.  We will consider dexrazoxane once his cumulative doses reach to about 350 mg per metered square.  We will also try to limit his Adriamycin total exposure to 550 mg per metered square. -He did not follow-up with [facility] lymphoma specialist yet.  We will review his scan and then decide if we need to call him back.   2.  Postsplenectomy state: -He received flu shot this past year. -Received Menactra and Pneumovax on 05/25/2018.  Total time spent is 25 minutes with more than 2% of the time spent face-to-face discussing treatment plan and coordination of care.    Orders placed this encounter:  Orders Placed This Encounter  Procedures  . NM PET Image Restag (PS) Skull Base To Thigh  . CBC with Differential/Platelet  .  Comprehensive metabolic panel  . Uric acid  . Hemoglobin A1c      Derek Jack, MD [place] [phone]

## 2018-07-13 NOTE — Assessment & Plan Note (Signed)
1.  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  He also underwent splenectomy. -Recurrence in April 1992, treated with CCNU, etoposide and cis-platinum with 6 cycles -Presentation with right axillary mass for the past 2 months.  Night sweats for the past 2 months.  Denies any fevers or weight loss.  - CT of the chest on 05/04/2018 shows 3.2 cm right axillary lymph node along with multiple small lymph nodes.  Several left axillary lymph node measuring up to 2.8 cm.  No mediastinal or hilar adenopathy.  There is supraclavicular adenopathy. - Family history significant for mother with breast cancer and father with lung cancer who was a smoker. -PET/CT scan on 05/12/2018 shows hypermetabolic lymphadenopathy in the bilateral axillary and subpectoral region, right subclavicular region and portacaval space. -Biopsy of the right axillary lymph node on 05/10/2018 shows B-cell lymphoma phenotypically consistent with nodular lymphocyte predominant Hodgkin's lymphoma. -LDH and beta-2 microglobulin were normal.  Hepatitis B and C panel is negative.  Uric acid is normal. -Echocardiogram shows EF of 55 to 60%. -2 cycles of R-CHOP from 05/24/2018 through 06/22/2018.  - He did not have any major side effects after cycle 2.  He complained of dry skin and some cracking of the lips at the corners of the mouth. -His blood sugar was elevated at 275.  We will check a hemoglobin A1c level today. -Physical examination showed complete resolution of the right axillary adenopathy. -I have reviewed his labs.  He may proceed with cycle 3 without any dose modifications.  I plan to repeat PET CT scan after this cycle. -He received Adriamycin 200 mg/m2 when he had his first chemotherapy in 1989.  We will consider dexrazoxane once his cumulative doses reach to about 350 mg per metered square.  We will also try to limit his  Adriamycin total exposure to 550 mg per metered square. -He did not follow-up with [facility] lymphoma specialist yet.  We will review his scan and then decide if we need to call him back.   2.  Postsplenectomy state: -He received flu shot this past year. -Received Menactra and Pneumovax on 05/25/2018.

## 2018-07-13 NOTE — Patient Instructions (Addendum)
[facility] Discharge Instructions for Patients Receiving Chemotherapy   Beginning January 23rd 2017 lab work for the Cancer Center will be done in the  Main lab at [facility] on 1st floor. If you have a lab appointment with the Cancer Center please come in thru the  Main Entrance and check in at the main information desk   Today you received the following chemotherapy agents Rituxan,Adriamycin,Vincristine and Cytoxan as well as Neulasta on-pro. Follow-up as scheduled. Call clinic for any questions or concerns  To help prevent nausea and vomiting after your treatment, we encourage you to take your nausea medication   If you develop nausea and vomiting, or diarrhea that is not controlled by your medication, call the clinic.  The clinic phone number is [phone]. Office hours are Monday-Friday 8:30am-5:00pm.  BELOW ARE SYMPTOMS THAT SHOULD BE REPORTED IMMEDIATELY:  *FEVER GREATER THAN 101.0 F  *CHILLS WITH OR WITHOUT FEVER  NAUSEA AND VOMITING THAT IS NOT CONTROLLED WITH YOUR NAUSEA MEDICATION  *UNUSUAL SHORTNESS OF BREATH  *UNUSUAL BRUISING OR BLEEDING  TENDERNESS IN MOUTH AND THROAT WITH OR WITHOUT PRESENCE OF ULCERS  *URINARY PROBLEMS  *BOWEL PROBLEMS  UNUSUAL RASH Items with * indicate a potential emergency and should be followed up as soon as possible. If you have an emergency after office hours please contact your primary care physician or go to the nearest emergency department.  Please call the clinic during office hours if you have any questions or concerns.   You may also contact the Patient Navigator at [phone] should you have any questions or need assistance in obtaining follow up care.      Resources For Cancer Patients and their Caregivers ? American Cancer Society: Can assist with transportation, wigs, general needs, runs Look Good Feel Better.        1-888-227-6333 ? Cancer Care: Provides financial assistance, online support  groups, medication/co-pay assistance.  1-800-813-HOPE (4673) ? [facility] Assists [place] cancer patients and their families through emotional , educational and financial support.  336-427-4357 ? [place] DSS Where to apply for food stamps, Medicaid and utility assistance. 336-342-1394 ? [facility]: Transportation to medical appointments. 336-347-2287 ? Social Security Administration: May apply for disability if have a Stage IV cancer. 336-342-7796 1-800-772-1213 ? [place] Aging, Disability and Transit Services: Assists with nutrition, care and transit needs. 336-349-2343         

## 2018-07-13 NOTE — Patient Instructions (Signed)
[facility] at [facility] Discharge Instructions  You were seen today by Dr. Katragadda. He went over your recent lab results. He will see you back in 3 weeks for labs and follow up.   Thank you for choosing [facility] at [facility] to provide your oncology and hematology care.  To afford each patient quality time with our provider, please arrive at least 15 minutes before your scheduled appointment time.   If you have a lab appointment with the Cancer Center please come in thru the  Main Entrance and check in at the main information desk  You need to re-schedule your appointment should you arrive 10 or more minutes late.  We strive to give you quality time with our providers, and arriving late affects you and other patients whose appointments are after yours.  Also, if you no show three or more times for appointments you may be dismissed from the clinic at the providers discretion.     Again, thank you for choosing [facility].  Our hope is that these requests will decrease the amount of time that you wait before being seen by our physicians.       _____________________________________________________________  Should you have questions after your visit to [facility], please contact our office at [phone] between the hours of 8:00 a.m. and 4:30 p.m.  Voicemails left after 4:00 p.m. will not be returned until the following business day.  For prescription refill requests, have your pharmacy contact our office and allow 72 hours.    Cancer Center Support Programs:   > Cancer Support Group  2nd Tuesday of the month 1pm-2pm, Journey Room    

## 2018-07-13 NOTE — Progress Notes (Signed)
0855 Labs reviewed with and pt seen by Dr. Delton Coombes and pt approved for chemo tx today per MD                            Sonia Baller tolerated chemo tx and Neulasta on-pro well without complaints or incident.Portacath checked with positive blood return noted prior to,during and after Adriamycin administration. Neulasta on-pro applied to pt's right arm with green indicator light flashing upon discharge. VSS upon discharge. Pt discharged self ambulatory in satisfactory condition

## 2018-07-14 ENCOUNTER — Encounter: Payer: BLUE CROSS/BLUE SHIELD | Admitting: Family Medicine

## 2018-07-16 ENCOUNTER — Encounter: Payer: BLUE CROSS/BLUE SHIELD | Admitting: Family Medicine

## 2018-07-16 ENCOUNTER — Other Ambulatory Visit (HOSPITAL_COMMUNITY): Payer: Self-pay | Admitting: Hematology

## 2018-07-16 DIAGNOSIS — C8108 Nodular lymphocyte predominant Hodgkin lymphoma, lymph nodes of multiple sites: Secondary | ICD-10-CM

## 2018-07-19 ENCOUNTER — Other Ambulatory Visit: Payer: Self-pay

## 2018-07-19 ENCOUNTER — Encounter (HOSPITAL_COMMUNITY): Payer: Self-pay | Admitting: *Deleted

## 2018-07-19 ENCOUNTER — Encounter (HOSPITAL_COMMUNITY): Payer: Self-pay

## 2018-07-19 ENCOUNTER — Inpatient Hospital Stay (HOSPITAL_COMMUNITY): Payer: BC Managed Care – PPO

## 2018-07-19 VITALS — BP 111/83 | HR 78 | Temp 98.2°F | Resp 18 | Wt 217.6 lb

## 2018-07-19 DIAGNOSIS — Z5112 Encounter for antineoplastic immunotherapy: Secondary | ICD-10-CM | POA: Diagnosis not present

## 2018-07-19 DIAGNOSIS — Z8571 Personal history of Hodgkin lymphoma: Secondary | ICD-10-CM

## 2018-07-19 DIAGNOSIS — E1165 Type 2 diabetes mellitus with hyperglycemia: Secondary | ICD-10-CM | POA: Diagnosis not present

## 2018-07-19 DIAGNOSIS — Z5111 Encounter for antineoplastic chemotherapy: Secondary | ICD-10-CM | POA: Diagnosis not present

## 2018-07-19 DIAGNOSIS — Z5189 Encounter for other specified aftercare: Secondary | ICD-10-CM | POA: Diagnosis not present

## 2018-07-19 DIAGNOSIS — C8108 Nodular lymphocyte predominant Hodgkin lymphoma, lymph nodes of multiple sites: Secondary | ICD-10-CM

## 2018-07-19 MED ORDER — HEPARIN SOD (PORK) LOCK FLUSH 100 UNIT/ML IV SOLN
500.0000 [IU] | Freq: Once | INTRAVENOUS | Status: AC | PRN
  Administered 2018-07-19: 16:00:00 500 [IU]

## 2018-07-19 MED ORDER — SODIUM CHLORIDE 0.9 % IV SOLN
Freq: Once | INTRAVENOUS | Status: AC
  Administered 2018-07-19: 14:00:00 via INTRAVENOUS
  Filled 2018-07-19: qty 1000

## 2018-07-19 MED ORDER — SODIUM CHLORIDE 0.9 % IV SOLN
Freq: Once | INTRAVENOUS | Status: AC
  Administered 2018-07-19: 13:00:00 via INTRAVENOUS

## 2018-07-19 MED ORDER — SODIUM CHLORIDE 0.9% FLUSH: 3.0000 mL | Freq: Once | INTRAVENOUS | Status: DC | PRN

## 2018-07-19 MED ORDER — SODIUM CHLORIDE 0.9% FLUSH
10.0000 mL | Freq: Once | INTRAVENOUS | Status: AC | PRN
  Administered 2018-07-19: 10 mL

## 2018-07-19 MED ORDER — ALTEPLASE 2 MG IJ SOLR: 2.0000 mg | Freq: Once | INTRAMUSCULAR | Status: DC | PRN

## 2018-07-19 MED ORDER — HEPARIN SOD (PORK) LOCK FLUSH 100 UNIT/ML IV SOLN: 250.0000 [IU] | Freq: Once | INTRAVENOUS | Status: DC | PRN

## 2018-07-19 MED ORDER — SODIUM CHLORIDE 0.9 % IV SOLN
Freq: Once | INTRAVENOUS | Status: AC
  Administered 2018-07-19: 14:00:00 via INTRAVENOUS
  Filled 2018-07-19: qty 4

## 2018-07-19 NOTE — Progress Notes (Signed)
Patient called the clinic reporting excessive nausea/vomiting over the weekend.  He also felt like he needed to be cleaned out so he took miralax and colace along with something else and now his stomach is hurting.  The patient states that the nausea medication at home isn't controlling his symptoms.    Patient is scheduled for fluids this afternoon per Dr. Delton Coombes.  Orders for nausea will be given once we evaluate the patient.

## 2018-07-19 NOTE — Patient Instructions (Signed)
[facility] at [facility]  Discharge Instructions:   _______________________________________________________________  Thank you for choosing [facility] at [facility] to provide your oncology and hematology care.  To afford each patient quality time with our providers, please arrive at least 15 minutes before your scheduled appointment.  You need to re-schedule your appointment if you arrive 10 or more minutes late.  We strive to give you quality time with our providers, and arriving late affects you and other patients whose appointments are after yours.  Also, if you no show three or more times for appointments you may be dismissed from the clinic.  Again, thank you for choosing [facility] at  [facility]. Our hope is that these requests will allow you access to exceptional care and in a timely manner. _______________________________________________________________  If you have questions after your visit, please contact our office at [phone] between the hours of 8:30 a.m. and 5:00 p.m. Voicemails left after 4:30 p.m. will not be returned until the following business day. _______________________________________________________________  For prescription refill requests, have your pharmacy contact our office. _______________________________________________________________  Recommendations made by the consultant and any test results will be sent to your referring physician. _______________________________________________________________ 

## 2018-07-19 NOTE — Progress Notes (Signed)
Pt presents today for hydration fluids and Zofran 8mg  IV. VSS. MAR reviewed and updated. Pt requesting appointment with Dr. Delton Coombes. Jene Every RN notified and requesting her at the bedside. RNester NP at the bedside to assess patient.   Hydration given today per MD orders. Tolerated infusion without adverse affects. Vital signs stable. No complaints at this time. Discharged from clinic ambulatory. F/U with [facility] as scheduled.

## 2018-07-20 ENCOUNTER — Inpatient Hospital Stay (HOSPITAL_COMMUNITY): Payer: BC Managed Care – PPO

## 2018-07-20 VITALS — BP 124/87 | HR 92 | Temp 98.7°F | Resp 18 | Wt 219.4 lb

## 2018-07-20 DIAGNOSIS — C8108 Nodular lymphocyte predominant Hodgkin lymphoma, lymph nodes of multiple sites: Secondary | ICD-10-CM | POA: Diagnosis not present

## 2018-07-20 DIAGNOSIS — Z5189 Encounter for other specified aftercare: Secondary | ICD-10-CM | POA: Diagnosis not present

## 2018-07-20 DIAGNOSIS — Z5111 Encounter for antineoplastic chemotherapy: Secondary | ICD-10-CM | POA: Diagnosis not present

## 2018-07-20 DIAGNOSIS — Z8571 Personal history of Hodgkin lymphoma: Secondary | ICD-10-CM

## 2018-07-20 DIAGNOSIS — E1165 Type 2 diabetes mellitus with hyperglycemia: Secondary | ICD-10-CM | POA: Diagnosis not present

## 2018-07-20 DIAGNOSIS — Z5112 Encounter for antineoplastic immunotherapy: Secondary | ICD-10-CM | POA: Diagnosis not present

## 2018-07-20 MED ORDER — SODIUM CHLORIDE 0.9 % IV SOLN
Freq: Once | INTRAVENOUS | Status: AC
  Administered 2018-07-20: 14:00:00 via INTRAVENOUS
  Filled 2018-07-20: qty 1000

## 2018-07-20 MED ORDER — SODIUM CHLORIDE 0.9% FLUSH
10.0000 mL | Freq: Once | INTRAVENOUS | Status: AC | PRN
  Administered 2018-07-20: 14:00:00 10 mL

## 2018-07-20 MED ORDER — HEPARIN SOD (PORK) LOCK FLUSH 100 UNIT/ML IV SOLN
500.0000 [IU] | Freq: Once | INTRAVENOUS | Status: AC | PRN
  Administered 2018-07-20: 500 [IU]

## 2018-07-20 MED ORDER — SODIUM CHLORIDE 0.9 % IV SOLN
Freq: Once | INTRAVENOUS | Status: AC
  Administered 2018-07-20: 14:00:00 via INTRAVENOUS
  Filled 2018-07-20: qty 4

## 2018-07-20 NOTE — Patient Instructions (Signed)
[facility] at [facility]  Discharge Instructions:   _______________________________________________________________  Thank you for choosing [facility] at [facility] to provide your oncology and hematology care.  To afford each patient quality time with our providers, please arrive at least 15 minutes before your scheduled appointment.  You need to re-schedule your appointment if you arrive 10 or more minutes late.  We strive to give you quality time with our providers, and arriving late affects you and other patients whose appointments are after yours.  Also, if you no show three or more times for appointments you may be dismissed from the clinic.  Again, thank you for choosing [facility] at [facility]. Our hope is that these requests will allow you access to exceptional care and in a timely manner. _______________________________________________________________  If you have questions after your visit, please contact our office at [phone] between the hours of 8:30 a.m. and 5:00 p.m. Voicemails left after 4:30 p.m. will not be returned until the following business day. _______________________________________________________________  For prescription refill requests, have your pharmacy contact our office. _______________________________________________________________  Recommendations made by the consultant and any test results will be sent to your referring physician. _______________________________________________________________ 

## 2018-07-20 NOTE — Progress Notes (Signed)
Patient states he is feeling much better today, very little nausea, eating a little . Stated he did have a regular BM today.  No other complaints. Giving hydration today as ordered.     Patient tolerated it well without problems. Vitals stable and discharged home from clinic ambulatory. Follow up as scheduled.

## 2018-07-21 ENCOUNTER — Ambulatory Visit (HOSPITAL_COMMUNITY): Payer: BC Managed Care – PPO

## 2018-07-26 ENCOUNTER — Other Ambulatory Visit: Payer: Self-pay

## 2018-07-26 ENCOUNTER — Ambulatory Visit (HOSPITAL_COMMUNITY)
Admission: RE | Admit: 2018-07-26 | Discharge: 2018-07-26 | Disposition: A | Discharge status: Home / Self Care | Payer: BC Managed Care – PPO | Source: Ambulatory Visit | Attending: Hematology | Admitting: Hematology

## 2018-07-26 DIAGNOSIS — C8108 Nodular lymphocyte predominant Hodgkin lymphoma, lymph nodes of multiple sites: Secondary | ICD-10-CM | POA: Insufficient documentation

## 2018-07-26 DIAGNOSIS — C8590 Non-Hodgkin lymphoma, unspecified, unspecified site: Secondary | ICD-10-CM | POA: Diagnosis not present

## 2018-07-26 MED ORDER — FLUDEOXYGLUCOSE F - 18 (FDG) INJECTION
13.8000 | Freq: Once | INTRAVENOUS | Status: AC | PRN
  Administered 2018-07-26: 13.8 via INTRAVENOUS

## 2018-07-28 ENCOUNTER — Other Ambulatory Visit (HOSPITAL_COMMUNITY): Payer: Self-pay | Admitting: *Deleted

## 2018-08-03 ENCOUNTER — Inpatient Hospital Stay (HOSPITAL_COMMUNITY): Payer: BC Managed Care – PPO

## 2018-08-03 ENCOUNTER — Other Ambulatory Visit: Payer: Self-pay

## 2018-08-03 ENCOUNTER — Inpatient Hospital Stay (HOSPITAL_BASED_OUTPATIENT_CLINIC_OR_DEPARTMENT_OTHER): Payer: BC Managed Care – PPO | Admitting: Hematology

## 2018-08-03 ENCOUNTER — Encounter (HOSPITAL_COMMUNITY): Payer: Self-pay | Admitting: Hematology

## 2018-08-03 VITALS — BP 119/75 | HR 78

## 2018-08-03 VITALS — BP 145/86 | HR 89 | Temp 98.7°F | Resp 18 | Wt 224.0 lb

## 2018-08-03 DIAGNOSIS — C8108 Nodular lymphocyte predominant Hodgkin lymphoma, lymph nodes of multiple sites: Secondary | ICD-10-CM

## 2018-08-03 DIAGNOSIS — Z5112 Encounter for antineoplastic immunotherapy: Secondary | ICD-10-CM | POA: Diagnosis not present

## 2018-08-03 DIAGNOSIS — E1165 Type 2 diabetes mellitus with hyperglycemia: Secondary | ICD-10-CM

## 2018-08-03 DIAGNOSIS — E119 Type 2 diabetes mellitus without complications: Secondary | ICD-10-CM

## 2018-08-03 DIAGNOSIS — Z5111 Encounter for antineoplastic chemotherapy: Secondary | ICD-10-CM | POA: Diagnosis not present

## 2018-08-03 DIAGNOSIS — Z5189 Encounter for other specified aftercare: Secondary | ICD-10-CM | POA: Diagnosis not present

## 2018-08-03 LAB — COMPREHENSIVE METABOLIC PANEL
ALT: 27 U/L (ref 0–44)
AST: 17 U/L (ref 15–41)
Albumin: 4 g/dL (ref 3.5–5.0)
Alkaline Phosphatase: 70 U/L (ref 38–126)
Anion gap: 12 (ref 5–15)
BUN: 12 mg/dL (ref 6–20)
CO2: 23 mmol/L (ref 22–32)
Calcium: 8.9 mg/dL (ref 8.9–10.3)
Chloride: 104 mmol/L (ref 98–111)
Creatinine, Ser: 0.92 mg/dL (ref 0.61–1.24)
GFR calc Af Amer: >60 mL/min (ref >60)
GFR calc non Af Amer: >60 mL/min (ref >60)
Glucose, Bld: 276 mg/dL — ABNORMAL HIGH (ref 70–99)
Potassium: 3.7 mmol/L (ref 3.5–5.1)
Sodium: 139 mmol/L (ref 135–145)
Total Bilirubin: 0.4 mg/dL (ref 0.3–1.2)
Total Protein: 6.7 g/dL (ref 6.5–8.1)

## 2018-08-03 LAB — CBC WITH DIFFERENTIAL/PLATELET
Abs Immature Granulocytes: 0.15 10*3/uL — ABNORMAL HIGH (ref 0.00–0.07)
Basophils Absolute: 0.1 10*3/uL (ref 0.0–0.1)
Basophils Relative: 1 %
Eosinophils Absolute: 0.1 10*3/uL (ref 0.0–0.5)
Eosinophils Relative: 1 %
HCT: 36.5 % — ABNORMAL LOW (ref 39.0–52.0)
Hemoglobin: 11.7 g/dL — ABNORMAL LOW (ref 13.0–17.0)
Immature Granulocytes: 2 %
Lymphocytes Relative: 25 %
Lymphs Abs: 1.7 10*3/uL (ref 0.7–4.0)
MCH: 27.7 pg (ref 26.0–34.0)
MCHC: 32.1 g/dL (ref 30.0–36.0)
MCV: 86.5 fL (ref 80.0–100.0)
Monocytes Absolute: 0.9 10*3/uL (ref 0.1–1.0)
Monocytes Relative: 13 %
Neutro Abs: 4.1 10*3/uL (ref 1.7–7.7)
Neutrophils Relative %: 58 %
Platelets: 346 10*3/uL (ref 150–400)
RBC: 4.22 MIL/uL (ref 4.22–5.81)
RDW: 17.4 % — ABNORMAL HIGH (ref 11.5–15.5)
WBC: 7 10*3/uL (ref 4.0–10.5)
nRBC: 0.4 % — ABNORMAL HIGH (ref 0.0–0.2)

## 2018-08-03 LAB — URIC ACID: Uric Acid, Serum: 2.5 mg/dL — ABNORMAL LOW (ref 3.7–8.6)

## 2018-08-03 MED ORDER — SODIUM CHLORIDE 0.9 % IV SOLN
375.0000 mg/m2 | Freq: Once | INTRAVENOUS | Status: AC
  Administered 2018-08-03: 900 mg via INTRAVENOUS
  Filled 2018-08-03: qty 50

## 2018-08-03 MED ORDER — LACTATED RINGERS IV SOLN
500.0000 mg/m2 | Freq: Once | INTRAVENOUS | Status: AC
  Administered 2018-08-03: 1180 mg via INTRAVENOUS
  Filled 2018-08-03: qty 118

## 2018-08-03 MED ORDER — GLIPIZIDE ER 2.5 MG PO TB24: 2.5000 mg | ORAL_TABLET | Freq: Every day | ORAL | 0 refills | Status: DC

## 2018-08-03 MED ORDER — LACTATED RINGERS IV SOLN: 500.0000 mg/m2 | Freq: Once | INTRAVENOUS | Status: DC

## 2018-08-03 MED ORDER — SODIUM CHLORIDE 0.9 % IV SOLN
Freq: Once | INTRAVENOUS | Status: AC
  Administered 2018-08-03: 09:00:00 via INTRAVENOUS

## 2018-08-03 MED ORDER — PALONOSETRON HCL INJECTION 0.25 MG/5ML
0.2500 mg | Freq: Once | INTRAVENOUS | Status: AC
  Administered 2018-08-03: 0.25 mg via INTRAVENOUS

## 2018-08-03 MED ORDER — HEPARIN SOD (PORK) LOCK FLUSH 100 UNIT/ML IV SOLN
500.0000 [IU] | Freq: Once | INTRAVENOUS | Status: AC | PRN
  Administered 2018-08-03: 500 [IU]

## 2018-08-03 MED ORDER — DOXORUBICIN HCL CHEMO IV INJECTION 2 MG/ML
50.0000 mg/m2 | Freq: Once | INTRAVENOUS | Status: AC
  Administered 2018-08-03: 118 mg via INTRAVENOUS
  Filled 2018-08-03: qty 59

## 2018-08-03 MED ORDER — DIPHENHYDRAMINE HCL 25 MG PO CAPS
50.0000 mg | ORAL_CAPSULE | Freq: Once | ORAL | Status: AC
  Administered 2018-08-03: 50 mg via ORAL

## 2018-08-03 MED ORDER — VINCRISTINE SULFATE CHEMO INJECTION 1 MG/ML
2.0000 mg | Freq: Once | INTRAVENOUS | Status: AC
  Administered 2018-08-03: 2 mg via INTRAVENOUS
  Filled 2018-08-03: qty 2

## 2018-08-03 MED ORDER — FAMOTIDINE IN NACL 20-0.9 MG/50ML-% IV SOLN
20.0000 mg | Freq: Once | INTRAVENOUS | Status: AC
  Administered 2018-08-03: 20 mg via INTRAVENOUS

## 2018-08-03 MED ORDER — ACETAMINOPHEN 325 MG PO TABS
650.0000 mg | ORAL_TABLET | Freq: Once | ORAL | Status: AC
  Administered 2018-08-03: 650 mg via ORAL

## 2018-08-03 MED ORDER — DIPHENHYDRAMINE HCL 25 MG PO CAPS
ORAL_CAPSULE | ORAL | Status: AC
  Filled 2018-08-03: qty 2

## 2018-08-03 MED ORDER — PALONOSETRON HCL INJECTION 0.25 MG/5ML
INTRAVENOUS | Status: AC
  Filled 2018-08-03: qty 5

## 2018-08-03 MED ORDER — SODIUM CHLORIDE 0.9% FLUSH
10.0000 mL | INTRAVENOUS | Status: DC | PRN
  Administered 2018-08-03: 10 mL
  Filled 2018-08-03: qty 10

## 2018-08-03 MED ORDER — SODIUM CHLORIDE 0.9 % IV SOLN
750.0000 mg/m2 | Freq: Once | INTRAVENOUS | Status: AC
  Administered 2018-08-03: 1760 mg via INTRAVENOUS
  Filled 2018-08-03: qty 88

## 2018-08-03 MED ORDER — SODIUM CHLORIDE 0.9 % IV SOLN
10.0000 mg | Freq: Once | INTRAVENOUS | Status: AC
  Administered 2018-08-03: 10 mg via INTRAVENOUS
  Filled 2018-08-03: qty 10

## 2018-08-03 MED ORDER — FAMOTIDINE IN NACL 20-0.9 MG/50ML-% IV SOLN
INTRAVENOUS | Status: AC
  Filled 2018-08-03: qty 50

## 2018-08-03 MED ORDER — PEGFILGRASTIM 6 MG/0.6ML ~~LOC~~ PSKT
6.0000 mg | PREFILLED_SYRINGE | Freq: Once | SUBCUTANEOUS | Status: AC
  Administered 2018-08-03: 6 mg via SUBCUTANEOUS
  Filled 2018-08-03: qty 0.6

## 2018-08-03 MED ORDER — ACETAMINOPHEN 325 MG PO TABS
ORAL_TABLET | ORAL | Status: AC
  Filled 2018-08-03: qty 2

## 2018-08-03 NOTE — Patient Instructions (Signed)
[place] at [facility] Discharge Instructions  Blood drawn peripherally today   Thank you for choosing [place] at [facility] to provide your oncology and hematology care.  To afford each patient quality time with our provider, please arrive at least 15 minutes before your scheduled appointment time.   If you have a lab appointment with the [facility] please come in thru the  Main Entrance and check in at the main information desk  You need to re-schedule your appointment should you arrive 10 or more minutes late.  We strive to give you quality time with our providers, and arriving late affects you and other patients whose appointments are after yours.  Also, if you no show three or more times for appointments you may be dismissed from the clinic at the providers discretion.     Again, thank you for choosing [facility].  Our hope is that these requests will decrease the amount of time that you wait before being seen by our physicians.       _____________________________________________________________  Should you have questions after your visit to [facility], please contact our office at [phone] between the hours of 8:00 a.m. and 4:30 p.m.  Voicemails left after 4:00 p.m. will not be returned until the following business day.  For prescription refill requests, have your pharmacy contact our office and allow 72 hours.    Cancer Center Support Programs:   > Cancer Support Group  2nd Tuesday of the month 1pm-2pm, Journey Room

## 2018-08-03 NOTE — Patient Instructions (Signed)
[facility] at [facility] Discharge Instructions  You were seen today by Dr. Katragadda. He went over your recent lab results. He will see you back in 3 weeks for labs, treatment and follow up.   Thank you for choosing [facility] at [facility] to provide your oncology and hematology care.  To afford each patient quality time with our provider, please arrive at least 15 minutes before your scheduled appointment time.   If you have a lab appointment with the Cancer Center please come in thru the  Main Entrance and check in at the main information desk  You need to re-schedule your appointment should you arrive 10 or more minutes late.  We strive to give you quality time with our providers, and arriving late affects you and other patients whose appointments are after yours.  Also, if you no show three or more times for appointments you may be dismissed from the clinic at the providers discretion.     Again, thank you for choosing [facility].  Our hope is that these requests will decrease the amount of time that you wait before being seen by our physicians.       _____________________________________________________________  Should you have questions after your visit to [facility], please contact our office at [phone] between the hours of 8:00 a.m. and 4:30 p.m.  Voicemails left after 4:00 p.m. will not be returned until the following business day.  For prescription refill requests, have your pharmacy contact our office and allow 72 hours.    Cancer Center Support Programs:   > Cancer Support Group  2nd Tuesday of the month 1pm-2pm, Journey Room    

## 2018-08-03 NOTE — Progress Notes (Signed)
0845 patient seen by Dr. Delton Coombes and lab work reviewed. Per MD, ok to proceed with RCHOP today. Zinecard added to treatment plan by MD. I spoke with Otilio Carpen who advised me that this has been authorized for today. Pt educated on new drug and pamphlet given with written information as well.   Grove L Lindaman tolerated treatment without incident or complaint. On pro applied and verified that green light flashing. VSS. Discharged self ambulatory in satisfactory condition.

## 2018-08-03 NOTE — Progress Notes (Signed)
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 ([place])   06/01/2018 -  Chemotherapy   The patient had DOXOrubicin (ADRIAMYCIN) chemo injection 118 mg, 50 mg/m2 = 118 mg, Intravenous,  Once, 4 of 6 cycles Administration: 118 mg (06/01/2018), 118 mg (06/22/2018), 118 mg (07/13/2018), 118 mg (08/03/2018) palonosetron (ALOXI) injection 0.25 mg, 0.25 mg, Intravenous,  Once, 4 of 6 cycles Administration: 0.25 mg (06/01/2018), 0.25 mg (06/22/2018), 0.25 mg (07/13/2018), 0.25 mg (08/03/2018) pegfilgrastim (NEULASTA ONPRO KIT) injection 6 mg, 6 mg, Subcutaneous, Once, 4 of 6 cycles Administration: 6 mg (06/01/2018), 6 mg (06/22/2018), 6 mg (07/13/2018), 6 mg (08/03/2018) vinCRIStine (ONCOVIN) 2 mg in sodium chloride 0.9 % 50 mL chemo infusion, 2 mg, Intravenous,  Once, 4 of 6 cycles Administration: 2 mg (06/01/2018), 2 mg (06/22/2018), 2 mg (07/13/2018), 2 mg (08/03/2018) riTUXimab (RITUXAN) 900 mg in sodium chloride 0.9 % 250 mL (2.6471 mg/mL) infusion, 375 mg/m2 = 900 mg, Intravenous,  Once, 1 of 1 cycle Administration: 900 mg (06/01/2018) cyclophosphamide (CYTOXAN) 1,760 mg in sodium chloride 0.9 % 250 mL chemo infusion, 750 mg/m2 = 1,760 mg, Intravenous,  Once, 4 of 6 cycles Administration: 1,760 mg (06/01/2018), 1,760 mg (06/22/2018), 1,760 mg (07/13/2018), 1,760 mg (08/03/2018) riTUXimab (RITUXAN) 900 mg in sodium chloride 0.9 % 160 mL infusion, 375 mg/m2 = 900 mg (100 % of original dose 375 mg/m2), Intravenous,  Once, 3 of 5 cycles Dose modification: 375 mg/m2 (original dose 375 mg/m2, Cycle 2) Administration:  900 mg (06/22/2018), 900 mg (07/13/2018), 900 mg (08/03/2018) dexrazoxane (ZINECARD) 1,180 mg in lactated ringers 300 mL IVPB, 500 mg/m2 = 1,180 mg (100 % of original dose 500 mg/m2), Intravenous,  Once, 1 of 3 cycles Dose modification: 500 mg/m2 (original dose 500 mg/m2, Cycle 4), 500 mg/m2 (original dose 500 mg/m2, Cycle 4) Administration: 1,180 mg (08/03/2018)  for chemotherapy treatment.       CANCER STAGING: Cancer Staging No matching staging information was found for the patient.   INTERVAL HISTORY:  Devin Tran 53 y.o. male returns for next cycle of chemotherapy.  Cycle 3 of chemotherapy was on 07/13/2018.  He had a PET scan done few days ago.  He reported occasional night sweats.  Also reported some burning and numbness of the fingers after treatment.  He also felt like white coating on the tongue a week after treatment.  Denies any symptoms of PND orthopnea.  Denies any fevers, night sweats or weight loss.  He has tolerated his chemotherapy without any GI side effects including nausea vomiting diarrhea or constipation.  No ER visits or hospitalizations.  Appetite is 100%.  Energy levels are 75%.    REVIEW OF SYSTEMS:  Review of Systems  Neurological: Positive for numbness.  All other systems reviewed and are negative.    PAST MEDICAL/SURGICAL HISTORY:  Past Medical History:  Diagnosis Date  . Complication of anesthesia   . Erectile dysfunction   . Hodgkin's lymphoma ([place])   . Hyperlipidemia   . Hypertension   . Infertility   . Low testosterone   . PONV (postoperative nausea and vomiting)    Past  Surgical History:  Procedure Laterality Date  . AXILLARY LYMPH NODE BIOPSY Right 05/10/2018   Procedure: AXILLARY LYMPH NODE BIOPSY, RIGHT;  Surgeon: Aviva Signs, MD;  Location: [facility];  Service: General;  Laterality: Right;  . IR LYMPHANGIOGRAM EXTREMITY UNI LEFT  03/02/1987  . PORTA CATH INSERTION    . PORTA CATH REMOVAL    . PORTACATH PLACEMENT Left 05/21/2018   Procedure:  INSERTION PORT-A-CATH (attached catheter in left subclavian);  Surgeon: Aviva Signs, MD;  Location: [facility];  Service: General;  Laterality: Left;  . SPLENECTOMY Left Over 20 years ago   Due to Hodgkin's lymphoma     SOCIAL HISTORY:  Social History   Socioeconomic History  . Marital status: Single    Spouse name: Not on file  . Number of children: Not on file  . Years of education: Not on file  . Highest education level: Not on file  Occupational History  . Occupation: Drive Chief Operating Officer: [employer]  . Financial resource strain: Not on file  . Food insecurity    Worry: Not on file    Inability: Not on file  . Transportation needs    Medical: Not on file    Non-medical: Not on file  Tobacco Use  . Smoking status: Never Smoker  . Smokeless tobacco: Never Used  Substance and Sexual Activity  . Alcohol use: No  . Drug use: No  . Sexual activity: Yes  Lifestyle  . Physical activity    Days per week: Not on file    Minutes per session: Not on file  . Stress: Not on file  Relationships  . Social Herbalist on phone: Not on file    Gets together: Not on file    Attends religious service: Not on file    Active member of club or organization: Not on file    Attends meetings of clubs or organizations: Not on file    Relationship status: Not on file  . Intimate partner violence    Fear of current or ex partner: Not on file    Emotionally abused: Not on file    Physically abused: Not on file    Forced sexual activity: Not on file  Other Topics Concern  . Not on file  Social History Narrative  . Not on file    FAMILY HISTORY:  Family History  Problem Relation Age of Onset  . Hypertension Mother   . Cancer Father        lung  . Hypertension Father   . Diabetes Father   . COPD Father   . Hypertension Brother   . Stroke Brother     CURRENT MEDICATIONS:  Outpatient Encounter Medications as of 08/03/2018  Medication Sig  .  allopurinol (ZYLOPRIM) 300 MG tablet TAKE 1 TABLET BY MOUTH EVERY DAY  . amLODipine (NORVASC) 10 MG tablet TAKE 1 TABLET BY MOUTH EVERY DAY (Patient taking differently: Take 5 mg by mouth daily. TAKE 1 TABLET BY MOUTH EVERY DAY)  . Ascorbic Acid (VITAMIN C) 1000 MG tablet Take 1,000 mg by mouth daily. Vitamin c lozenge  . aspirin EC 81 MG tablet Take 81 mg by mouth daily.   . CYCLOPHOSPHAMIDE IV Inject into the vein every 21 ( twenty-one) days.  Marland Kitchen DOXOrubicin (ADRIAMYCIN) 2 MG/ML injection Inject into the vein every 21 ( twenty-one) days.  Marland Kitchen glipiZIDE (GLUCOTROL XL) 2.5 MG 24 hr tablet Take 1 tablet (2.5 mg total)  by mouth daily with breakfast.  . HYDROcodone-acetaminophen (NORCO) 5-325 MG tablet Take 1 tablet by mouth every 4 (four) hours as needed for moderate pain.  Marland Kitchen lidocaine-prilocaine (EMLA) cream Apply pea-sized amount to port a cath site and cover with plastic wrap one hour prior to each chemotherapy appointment  . Multiple Vitamins-Minerals (ONE-A-DAY MENS HEALTH FORMULA PO) Take 1 tablet by mouth daily.   . ondansetron (ZOFRAN-ODT) 8 MG disintegrating tablet   . predniSONE (DELTASONE) 20 MG tablet Take 5 tablets (100 mg total) by mouth daily. Take on days 1-5 of chemotherapy.  . prochlorperazine (COMPAZINE) 10 MG tablet Take 1 tablet (10 mg total) by mouth every 6 (six) hours as needed (Nausea or vomiting).  . Propylene Glycol (SYSTANE BALANCE) 0.6 % SOLN Place 1 drop into both eyes 2 (two) times daily as needed (dry eyes).  . riTUXimab (RITUXAN IV) Inject into the vein every 21 ( twenty-one) days.  . vinCRIStine 2 mg in sodium chloride 0.9 % 50 mL Inject 2 mg into the vein every 21 ( twenty-one) days.   No facility-administered encounter medications on file as of 08/03/2018.     ALLERGIES:  Allergies  Allergen Reactions  . Bleomycin Other (See Comments)    Cramping all over     PHYSICAL EXAM:  ECOG Performance status: 1  Vitals:   08/03/18 0759  BP: (!) 145/86  Pulse: 89   Resp: 18  Temp: 98.7 F (37.1 C)  SpO2: 98%   Filed Weights   08/03/18 0759  Weight: 224 lb (101.6 kg)    Physical Exam Vitals signs reviewed.  Constitutional:      Appearance: Normal appearance.  Cardiovascular:     Rate and Rhythm: Normal rate and regular rhythm.     Heart sounds: Normal heart sounds.  Pulmonary:     Effort: Pulmonary effort is normal.     Breath sounds: Normal breath sounds.  Abdominal:     General: There is no distension.     Palpations: Abdomen is soft. There is no mass.  Musculoskeletal:        General: No swelling.  Lymphadenopathy:     Cervical: No cervical adenopathy.  Skin:    General: Skin is warm.  Neurological:     General: No focal deficit present.     Mental Status: He is alert and oriented to person, place, and time.  Psychiatric:        Mood and Affect: Mood normal.        Behavior: Behavior normal.      LABORATORY DATA:  I have reviewed the labs as listed.  CBC    Component Value Date/Time   WBC 7.0 08/03/2018 0746   RBC 4.22 08/03/2018 0746   HGB 11.7 (L) 08/03/2018 0746   HGB 16.1 05/03/2018 1553   HCT 36.5 (L) 08/03/2018 0746   HCT 49.8 05/03/2018 1553   PLT 346 08/03/2018 0746   PLT 280 05/03/2018 1553   MCV 86.5 08/03/2018 0746   MCV 82 05/03/2018 1553   MCH 27.7 08/03/2018 0746   MCHC 32.1 08/03/2018 0746   RDW 17.4 (H) 08/03/2018 0746   RDW 14.4 05/03/2018 1553   LYMPHSABS 1.7 08/03/2018 0746   LYMPHSABS 4.5 (H) 05/03/2018 1553   MONOABS 0.9 08/03/2018 0746   EOSABS 0.1 08/03/2018 0746   EOSABS 0.2 05/03/2018 1553   BASOSABS 0.1 08/03/2018 0746   BASOSABS 0.1 05/03/2018 1553   CMP Latest Ref Rng & Units 08/03/2018 07/13/2018 06/22/2018  Glucose 70 -  99 mg/dL 276(H) 275(H) 240(H)  BUN 6 - 20 mg/dL '12 13 17  ' Creatinine 0.61 - 1.24 mg/dL 0.92 1.07 1.04  Sodium 135 - 145 mmol/L 139 139 141  Potassium 3.5 - 5.1 mmol/L 3.7 3.7 3.6  Chloride 98 - 111 mmol/L 104 103 105  CO2 22 - 32 mmol/L '23 23 24  ' Calcium 8.9 -  10.3 mg/dL 8.9 9.1 9.3  Total Protein 6.5 - 8.1 g/dL 6.7 7.0 7.2  Total Bilirubin 0.3 - 1.2 mg/dL 0.4 0.6 0.3  Alkaline Phos 38 - 126 U/L 70 60 63  AST 15 - 41 U/L '17 20 20  ' ALT 0 - 44 U/L 27 35 31       DIAGNOSTIC IMAGING:  I have independently reviewed the scans and discussed with the patient.   I have reviewed Venita Lick LPN's note and agree with the documentation.  I personally performed a face-to-face visit, made revisions and my assessment and plan is as follows.    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  NLP Hodgkin's lymphoma (recurrent 3B): -History of stage IV and LP HL with liver involvement diagnosed in 1989, treated with MOPP-ABVD.  He underwent splenectomy. -Recurrence in April 1992, treated with CCNU, etoposide and cisplatin with 6 cycles. - Presentation with right axillary mass, night sweats for 2 months, no fevers or weight loss. - PET CT scan on 05/12/2018 shows hypermetabolic lymphadenopathy in the bilateral axillary, subpectoral region, right subclavicular region and portacaval space. -Biopsy of the right axillary lymph node on 05/10/2018 shows B-cell lymphoma phenotypically consistent with NLP HL. - Echocardiogram with a EF of 55 to 60%. -3 cycles of R-CHOP from 05/24/2018 through 07/13/2018. - We have reviewed results of interim staging PET CT scan on 07/26/2018.  Significant reduction in size and FDG uptake associated with thoracic and upper abdominal adenopathy.  Residual FDG uptake within the thoracic lymph node is low level compatible with Deauville 2.  No evidence of residual or recurrent metabolically active tumor. - He received Adriamycin 200 mg per metered square when he had his first chemotherapy in 1989.  We will try to limit his Adriamycin exposure to 550 mg per metered square. - I will add dexrazoxane to his regimen at 500 mg per metered square.  We talked about the side effects in detail. - He  reported some night sweats.  We will do close follow-up on it. -I will see him back in 3 weeks for follow-up.  2.  Post splenectomy state: - Received Menactra and Pneumovax on 05/25/2018. -He received a flu shot this past season.  3.  Diabetes: -His sugars are running high lately since he started chemo and steroids. -I checked his hemoglobin A1c on 07/13/2018 which was elevated at 8.6. - I will start him on glipizide XR 2.5 mg with breakfast.  We will titrated up as needed.  Total time spent is 40 minutes with more than 50% of the time spent discussing scan results, treatment plan, addition of new medications, starting diabetic medicine, counseling and coordination of care.    Orders placed this encounter:  Orders Placed This Encounter  Procedures  . ECHOCARDIOGRAM COMPLETE      Derek Jack, [place] [phone]

## 2018-08-03 NOTE — Assessment & Plan Note (Addendum)
1.  NLP Hodgkin's lymphoma (recurrent 3B): -History of stage IV and LP HL with liver involvement diagnosed in 1989, treated with MOPP-ABVD.  He underwent splenectomy. -Recurrence in April 1992, treated with CCNU, etoposide and cisplatin with 6 cycles. - Presentation with right axillary mass, night sweats for 2 months, no fevers or weight loss. - PET CT scan on 05/12/2018 shows hypermetabolic lymphadenopathy in the bilateral axillary, subpectoral region, right subclavicular region and portacaval space. -Biopsy of the right axillary lymph node on 05/10/2018 shows B-cell lymphoma phenotypically consistent with NLP HL. - Echocardiogram with a EF of 55 to 60%. -3 cycles of R-CHOP from 05/24/2018 through 07/13/2018. - We have reviewed results of interim staging PET CT scan on 07/26/2018.  Significant reduction in size and FDG uptake associated with thoracic and upper abdominal adenopathy.  Residual FDG uptake within the thoracic lymph node is low level compatible with Deauville 2.  No evidence of residual or recurrent metabolically active tumor. - He received Adriamycin 200 mg per metered square when he had his first chemotherapy in 1989.  We will try to limit his Adriamycin exposure to 550 mg per metered square. - I will add dexrazoxane to his regimen at 500 mg per metered square.  We talked about the side effects in detail. - He reported some night sweats.  We will do close follow-up on it. -I will see him back in 3 weeks for follow-up.  2.  Post splenectomy state: - Received Menactra and Pneumovax on 05/25/2018. -He received a flu shot this past season.  3.  Diabetes: -His sugars are running high lately since he started chemo and steroids. -I checked his hemoglobin A1c on 07/13/2018 which was elevated at 8.6. - I will start him on glipizide XR 2.5 mg with breakfast.  We will titrated up as needed.

## 2018-08-03 NOTE — Patient Instructions (Signed)
[facility] Discharge Instructions for Patients Receiving Chemotherapy   Beginning January 23rd 2017 lab work for the [facility] will be done in the  Main lab at [facility] on 1st floor. If you have a lab appointment with the [place] please come in thru the  Main Entrance and check in at the main information desk   Today you received the following chemotherapy agents RCHOP  To help prevent nausea and vomiting after your treatment, we encourage you to take your nausea medication If you develop nausea and vomiting, or diarrhea that is not controlled by your medication, call the clinic.  The clinic phone number is [phone]. Office hours are Monday-Friday 8:30am-5:00pm.  BELOW ARE SYMPTOMS THAT SHOULD BE REPORTED IMMEDIATELY:  *FEVER GREATER THAN 101.0 F  *CHILLS WITH OR WITHOUT FEVER  NAUSEA AND VOMITING THAT IS NOT CONTROLLED WITH YOUR NAUSEA MEDICATION  *UNUSUAL SHORTNESS OF BREATH  *UNUSUAL BRUISING OR BLEEDING  TENDERNESS IN MOUTH AND THROAT WITH OR WITHOUT PRESENCE OF ULCERS  *URINARY PROBLEMS  *BOWEL PROBLEMS  UNUSUAL RASH Items with * indicate a potential emergency and should be followed up as soon as possible. If you have an emergency after office hours please contact your primary care physician or go to the nearest emergency department.  Please call the clinic during office hours if you have any questions or concerns.   You may also contact the Patient Navigator at [phone] should you have any questions or need assistance in obtaining follow up care.      Resources For Cancer Patients and their Caregivers ? American Cancer Society: Can assist with transportation, wigs, general needs, runs Look Good Feel Better.        586 276 0469 ? Cancer Care: Provides financial assistance, online support groups, medication/co-pay assistance.  1-800-813-HOPE 620-115-5029) ? [employer] Assists [place] cancer  patients and their families through emotional , educational and financial support.  682-460-5355 ? [place] DSS Where to apply for food stamps, Medicaid and utility assistance. 414 632 9009 ? [facility]: Transportation to medical appointments. 5790932095 ? Social Security Administration: May apply for disability if have a Stage IV cancer. 4796374970 229 191 2868 ? [employer], Disability and Transit Services: Assists with nutrition, care and transit needs. (838)146-6596

## 2018-08-05 ENCOUNTER — Encounter (HOSPITAL_COMMUNITY): Payer: Self-pay | Admitting: *Deleted

## 2018-08-05 NOTE — Progress Notes (Signed)
Patient called the clinic stating reporting nausea/vomiting today that has not eased up with his compazine.  He took it early this morning.  I reviewed the nausea sheet with him and explained that he can alternate the medications, he does not have to wait 8 hours in between to take the zofran.  I encouraged him to take the zofran now and then if he is still nauseated in 30 minutes to take the compazine.  I provided him with our upcoming holiday hours and advised that if he vomits more than 3 times to come to the ER for fluids and antiemetics as needed.  He verbalizes understanding.  I also advised him that if he needs a nurse he can contact our after-hours triage call line.  He verbalizes understanding.

## 2018-08-09 ENCOUNTER — Other Ambulatory Visit: Payer: Self-pay

## 2018-08-09 ENCOUNTER — Inpatient Hospital Stay (HOSPITAL_COMMUNITY): Payer: BC Managed Care – PPO | Attending: Hematology

## 2018-08-09 VITALS — BP 104/68 | HR 93 | Temp 97.3°F | Resp 18 | Wt 211.0 lb

## 2018-08-09 DIAGNOSIS — K5900 Constipation, unspecified: Secondary | ICD-10-CM | POA: Diagnosis not present

## 2018-08-09 DIAGNOSIS — C8108 Nodular lymphocyte predominant Hodgkin lymphoma, lymph nodes of multiple sites: Secondary | ICD-10-CM | POA: Diagnosis not present

## 2018-08-09 DIAGNOSIS — R112 Nausea with vomiting, unspecified: Secondary | ICD-10-CM

## 2018-08-09 DIAGNOSIS — Z8571 Personal history of Hodgkin lymphoma: Secondary | ICD-10-CM

## 2018-08-09 DIAGNOSIS — E119 Type 2 diabetes mellitus without complications: Secondary | ICD-10-CM | POA: Insufficient documentation

## 2018-08-09 DIAGNOSIS — Z452 Encounter for adjustment and management of vascular access device: Secondary | ICD-10-CM | POA: Insufficient documentation

## 2018-08-09 DIAGNOSIS — R110 Nausea: Secondary | ICD-10-CM

## 2018-08-09 DIAGNOSIS — Z5111 Encounter for antineoplastic chemotherapy: Secondary | ICD-10-CM | POA: Diagnosis not present

## 2018-08-09 DIAGNOSIS — R5383 Other fatigue: Secondary | ICD-10-CM | POA: Insufficient documentation

## 2018-08-09 DIAGNOSIS — Z5112 Encounter for antineoplastic immunotherapy: Secondary | ICD-10-CM | POA: Diagnosis not present

## 2018-08-09 DIAGNOSIS — Z5189 Encounter for other specified aftercare: Secondary | ICD-10-CM | POA: Insufficient documentation

## 2018-08-09 MED ORDER — SODIUM CHLORIDE 0.9 % IV SOLN
Freq: Once | INTRAVENOUS | Status: AC
  Administered 2018-08-09: 12:00:00 via INTRAVENOUS
  Filled 2018-08-09: qty 1000

## 2018-08-09 MED ORDER — PROCHLORPERAZINE EDISYLATE 10 MG/2ML IJ SOLN
INTRAMUSCULAR | Status: AC
  Filled 2018-08-09: qty 2

## 2018-08-09 MED ORDER — HEPARIN SOD (PORK) LOCK FLUSH 100 UNIT/ML IV SOLN
500.0000 [IU] | Freq: Once | INTRAVENOUS | Status: AC | PRN
  Administered 2018-08-09: 500 [IU]

## 2018-08-09 MED ORDER — SODIUM CHLORIDE 0.9% FLUSH
10.0000 mL | Freq: Once | INTRAVENOUS | Status: AC | PRN
  Administered 2018-08-09: 10 mL

## 2018-08-09 MED ORDER — SODIUM CHLORIDE 0.9 % IV SOLN
INTRAVENOUS | Status: DC
  Administered 2018-08-09: 11:00:00 via INTRAVENOUS

## 2018-08-09 MED ORDER — PROCHLORPERAZINE EDISYLATE 10 MG/2ML IJ SOLN
10.0000 mg | Freq: Once | INTRAMUSCULAR | Status: AC
  Administered 2018-08-09: 13:00:00 10 mg via INTRAVENOUS

## 2018-08-09 MED ORDER — SODIUM CHLORIDE 0.9 % IV SOLN
Freq: Once | INTRAVENOUS | Status: AC
  Administered 2018-08-09: 12:00:00 via INTRAVENOUS
  Filled 2018-08-09: qty 4

## 2018-08-09 NOTE — Progress Notes (Signed)
To treatment area for hydration secondary to nausea.  Patient eating small amounts over the weekend but still having nausea problems with prn vomiting.  PRN constipation.  Reviewed how to manage both nausea, vomiting, and constipation with understanding verbalized.  No s/s of distress noted.  Medication list printed for review with the patient and copy given for home.  1225-patient in the bathroom with nausea and dry heaves.  Verbal order compazine 10mg  IV now verbal order Dr. Delton Coombes.   1300-patient stated nausea was better.  Resting with no s/s of distress noted.   Patient tolerated hydration with no complaints voiced.  Stated nausea was better.  Reviewed medications for nausea again with understanding verbalized.  Instructed the patient to start with bland foods and try to avoid greasy/spicy foods and eat small meals.  Port site clean and dry with good blood return noted before and after hydration.  Instructed the patient to call in the morning if he felt he needed hydration again.  Band aid applied to port.  VSs with discharge and left ambulatory with no s/s of distress noted.

## 2018-08-11 ENCOUNTER — Inpatient Hospital Stay (HOSPITAL_BASED_OUTPATIENT_CLINIC_OR_DEPARTMENT_OTHER): Payer: BC Managed Care – PPO | Admitting: Nurse Practitioner

## 2018-08-11 ENCOUNTER — Other Ambulatory Visit: Payer: Self-pay

## 2018-08-11 ENCOUNTER — Encounter (HOSPITAL_COMMUNITY): Payer: Self-pay | Admitting: *Deleted

## 2018-08-11 ENCOUNTER — Inpatient Hospital Stay (HOSPITAL_COMMUNITY): Payer: BC Managed Care – PPO

## 2018-08-11 ENCOUNTER — Encounter (HOSPITAL_COMMUNITY): Payer: Self-pay

## 2018-08-11 VITALS — BP 114/74 | HR 84 | Temp 98.7°F | Resp 18

## 2018-08-11 DIAGNOSIS — K5900 Constipation, unspecified: Secondary | ICD-10-CM | POA: Diagnosis not present

## 2018-08-11 DIAGNOSIS — E119 Type 2 diabetes mellitus without complications: Secondary | ICD-10-CM | POA: Diagnosis not present

## 2018-08-11 DIAGNOSIS — C8108 Nodular lymphocyte predominant Hodgkin lymphoma, lymph nodes of multiple sites: Secondary | ICD-10-CM

## 2018-08-11 DIAGNOSIS — R5383 Other fatigue: Secondary | ICD-10-CM | POA: Diagnosis not present

## 2018-08-11 DIAGNOSIS — Z452 Encounter for adjustment and management of vascular access device: Secondary | ICD-10-CM | POA: Diagnosis not present

## 2018-08-11 DIAGNOSIS — Z5111 Encounter for antineoplastic chemotherapy: Secondary | ICD-10-CM | POA: Diagnosis not present

## 2018-08-11 DIAGNOSIS — R110 Nausea: Secondary | ICD-10-CM

## 2018-08-11 DIAGNOSIS — R112 Nausea with vomiting, unspecified: Secondary | ICD-10-CM

## 2018-08-11 DIAGNOSIS — Z5189 Encounter for other specified aftercare: Secondary | ICD-10-CM | POA: Diagnosis not present

## 2018-08-11 DIAGNOSIS — Z5112 Encounter for antineoplastic immunotherapy: Secondary | ICD-10-CM | POA: Diagnosis not present

## 2018-08-11 DIAGNOSIS — Z8571 Personal history of Hodgkin lymphoma: Secondary | ICD-10-CM

## 2018-08-11 MED ORDER — HEPARIN SOD (PORK) LOCK FLUSH 100 UNIT/ML IV SOLN
500.0000 [IU] | Freq: Once | INTRAVENOUS | Status: AC | PRN
  Administered 2018-08-11: 500 [IU]

## 2018-08-11 MED ORDER — SODIUM CHLORIDE 0.9 % IV SOLN
Freq: Once | INTRAVENOUS | Status: AC
  Administered 2018-08-11: 15:00:00 via INTRAVENOUS
  Filled 2018-08-11: qty 1000

## 2018-08-11 MED ORDER — SODIUM CHLORIDE 0.9 % IV SOLN: Freq: Once | INTRAVENOUS | Status: DC

## 2018-08-11 MED ORDER — ONDANSETRON HCL 4 MG/2ML IJ SOLN
8.0000 mg | Freq: Once | INTRAMUSCULAR | Status: AC
  Administered 2018-08-11: 8 mg via INTRAVENOUS

## 2018-08-11 MED ORDER — ONDANSETRON HCL 4 MG/2ML IJ SOLN
INTRAMUSCULAR | Status: AC
  Filled 2018-08-11: qty 4

## 2018-08-11 MED ORDER — SODIUM CHLORIDE 0.9% FLUSH
10.0000 mL | Freq: Once | INTRAVENOUS | Status: AC | PRN
  Administered 2018-08-11: 10 mL

## 2018-08-11 NOTE — Progress Notes (Signed)
Devin Tran, [place] [id number]   CLINIC:  Medical Oncology/Hematology  PCP:  [name], MD [place] [address] [phone]   REASON FOR VISIT: Follow-up for Hodgkin's lymphoma  CURRENT THERAPY: R-CHOP  BRIEF ONCOLOGIC HISTORY:  Oncology History  Nodular lymphocyte predom Hodgkin lymphoma lymph nodes multiple sites ([place])  05/25/2018 Initial Diagnosis   Nodular lymphocyte predom Hodgkin lymphoma lymph nodes multiple sites ([place])   06/01/2018 -  Chemotherapy   The patient had DOXOrubicin (ADRIAMYCIN) chemo injection 118 mg, 50 mg/m2 = 118 mg, Intravenous,  Once, 4 of 6 cycles Administration: 118 mg (06/01/2018), 118 mg (06/22/2018), 118 mg (07/13/2018), 118 mg (08/03/2018) palonosetron (ALOXI) injection 0.25 mg, 0.25 mg, Intravenous,  Once, 4 of 6 cycles Administration: 0.25 mg (06/01/2018), 0.25 mg (06/22/2018), 0.25 mg (07/13/2018), 0.25 mg (08/03/2018) pegfilgrastim (NEULASTA ONPRO KIT) injection 6 mg, 6 mg, Subcutaneous, Once, 4 of 6 cycles Administration: 6 mg (06/01/2018), 6 mg (06/22/2018), 6 mg (07/13/2018), 6 mg (08/03/2018) vinCRIStine (ONCOVIN) 2 mg in sodium chloride 0.9 % 50 mL chemo infusion, 2 mg, Intravenous,  Once, 4 of 6 cycles Administration: 2 mg (06/01/2018), 2 mg (06/22/2018), 2 mg (07/13/2018), 2 mg (08/03/2018) riTUXimab (RITUXAN) 900 mg in sodium chloride 0.9 % 250 mL (2.6471 mg/mL) infusion, 375 mg/m2 = 900 mg, Intravenous,  Once, 1 of 1 cycle Administration: 900 mg (06/01/2018) cyclophosphamide (CYTOXAN) 1,760 mg in sodium chloride 0.9 % 250 mL chemo infusion, 750 mg/m2 = 1,760 mg, Intravenous,  Once, 4 of 6 cycles Administration: 1,760 mg (06/01/2018), 1,760 mg (06/22/2018), 1,760 mg (07/13/2018), 1,760 mg (08/03/2018) riTUXimab (RITUXAN) 900 mg in sodium chloride 0.9 % 160 mL infusion, 375 mg/m2 = 900 mg (100 % of original dose 375 mg/m2), Intravenous,  Once, 3 of 5 cycles Dose modification: 375 mg/m2 (original dose 375 mg/m2,  Cycle 2) Administration: 900 mg (06/22/2018), 900 mg (07/13/2018), 900 mg (08/03/2018) dexrazoxane (ZINECARD) 1,180 mg in lactated ringers 300 mL IVPB, 500 mg/m2 = 1,180 mg (100 % of original dose 500 mg/m2), Intravenous,  Once, 1 of 3 cycles Dose modification: 500 mg/m2 (original dose 500 mg/m2, Cycle 4), 500 mg/m2 (original dose 500 mg/m2, Cycle 4) Administration: 1,180 mg (08/03/2018)  for chemotherapy treatment.       INTERVAL HISTORY:  Devin Tran 53 y.o. male returns for routine follow-up for Hodgkin's lymphoma.  He reports he is not recovering from his last cycle of chemo as he normally does.  He reports increased fatigue, decreased appetite, and constipation.  He reports he had a small bowel movement upon arriving to the unit today.  It was hard and dark.  He reports he is not eating a whole lot of food either. He reports mild nausea and vomiting,but denies diarrhea. Denies any new pains. Had not noticed any recent bleeding such as epistaxis, hematuria or hematochezia. Denies recent chest pain on exertion, shortness of breath on minimal exertion, pre-syncopal episodes, or palpitations. Denies any numbness or tingling in hands or feet. Denies any recent fevers, infections, or recent hospitalizations. Patient reports appetite at 25% and energy level at 25%.     REVIEW OF SYSTEMS:  Review of Systems  Constitutional: Positive for appetite change and fatigue.  Gastrointestinal: Positive for constipation.  All other systems reviewed and are negative.    PAST MEDICAL/SURGICAL HISTORY:  Past Medical History:  Diagnosis Date  . Complication of anesthesia   . Erectile dysfunction   . Hodgkin's lymphoma ([place])   . Hyperlipidemia   .  Hypertension   . Infertility   . Low testosterone   . PONV (postoperative nausea and vomiting)    Past Surgical History:  Procedure Laterality Date  . AXILLARY LYMPH NODE BIOPSY Right 05/10/2018   Procedure: AXILLARY LYMPH NODE BIOPSY, RIGHT;  Surgeon: Aviva Signs, MD;  Location: [facility];  Service: General;  Laterality: Right;  . IR LYMPHANGIOGRAM EXTREMITY UNI LEFT  03/02/1987  . PORTA CATH INSERTION    . PORTA CATH REMOVAL    . PORTACATH PLACEMENT Left 05/21/2018   Procedure: INSERTION PORT-A-CATH (attached catheter in left subclavian);  Surgeon: Aviva Signs, MD;  Location: [facility];  Service: General;  Laterality: Left;  . SPLENECTOMY Left Over 20 years ago   Due to Hodgkin's lymphoma     SOCIAL HISTORY:  Social History   Socioeconomic History  . Marital status: Single    Spouse name: Not on file  . Number of children: Not on file  . Years of education: Not on file  . Highest education level: Not on file  Occupational History  . Occupation: Drive Chief Operating Officer: [employer]  . Financial resource strain: Not on file  . Food insecurity    Worry: Not on file    Inability: Not on file  . Transportation needs    Medical: Not on file    Non-medical: Not on file  Tobacco Use  . Smoking status: Never Smoker  . Smokeless tobacco: Never Used  Substance and Sexual Activity  . Alcohol use: No  . Drug use: No  . Sexual activity: Yes  Lifestyle  . Physical activity    Days per week: Not on file    Minutes per session: Not on file  . Stress: Not on file  Relationships  . Social Herbalist on phone: Not on file    Gets together: Not on file    Attends religious service: Not on file    Active member of club or organization: Not on file    Attends meetings of clubs or organizations: Not on file    Relationship status: Not on file  . Intimate partner violence    Fear of current or ex partner: Not on file    Emotionally abused: Not on file    Physically abused: Not on file    Forced sexual activity: Not on file  Other Topics Concern  . Not on file  Social History Narrative  . Not on file    FAMILY HISTORY:  Family History  Problem Relation Age of Onset  . Hypertension Mother   . Cancer  Father        lung  . Hypertension Father   . Diabetes Father   . COPD Father   . Hypertension Brother   . Stroke Brother     CURRENT MEDICATIONS:  Outpatient Encounter Medications as of 08/11/2018  Medication Sig  . allopurinol (ZYLOPRIM) 300 MG tablet TAKE 1 TABLET BY MOUTH EVERY DAY  . amLODipine (NORVASC) 10 MG tablet TAKE 1 TABLET BY MOUTH EVERY DAY (Patient taking differently: Take 5 mg by mouth daily. TAKE 1 TABLET BY MOUTH EVERY DAY)  . Ascorbic Acid (VITAMIN C) 1000 MG tablet Take 1,000 mg by mouth daily. Vitamin c lozenge  . aspirin EC 81 MG tablet Take 81 mg by mouth daily.   . CYCLOPHOSPHAMIDE IV Inject into the vein every 21 ( twenty-one) days.  Marland Kitchen DOXOrubicin (ADRIAMYCIN) 2 MG/ML injection Inject into the  vein every 21 ( twenty-one) days.  Marland Kitchen glipiZIDE (GLUCOTROL XL) 2.5 MG 24 hr tablet Take 1 tablet (2.5 mg total) by mouth daily with breakfast.  . HYDROcodone-acetaminophen (NORCO) 5-325 MG tablet Take 1 tablet by mouth every 4 (four) hours as needed for moderate pain.  Marland Kitchen lidocaine-prilocaine (EMLA) cream Apply pea-sized amount to port a cath site and cover with plastic wrap one hour prior to each chemotherapy appointment  . Multiple Vitamins-Minerals (ONE-A-DAY MENS HEALTH FORMULA PO) Take 1 tablet by mouth daily.   . ondansetron (ZOFRAN-ODT) 8 MG disintegrating tablet   . predniSONE (DELTASONE) 20 MG tablet Take 5 tablets (100 mg total) by mouth daily. Take on days 1-5 of chemotherapy.  . prochlorperazine (COMPAZINE) 10 MG tablet Take 1 tablet (10 mg total) by mouth every 6 (six) hours as needed (Nausea or vomiting).  . Propylene Glycol (SYSTANE BALANCE) 0.6 % SOLN Place 1 drop into both eyes 2 (two) times daily as needed (dry eyes).  . riTUXimab (RITUXAN IV) Inject into the vein every 21 ( twenty-one) days.  . vinCRIStine 2 mg in sodium chloride 0.9 % 50 mL Inject 2 mg into the vein every 21 ( twenty-one) days.   Facility-Administered Encounter Medications as of 08/11/2018   Medication  . heparin lock flush 100 unit/mL  . [COMPLETED] sodium chloride 0.9 % 1,000 mL with potassium chloride 20 mEq, magnesium sulfate 2 g infusion  . [COMPLETED] sodium chloride flush (NS) 0.9 % injection 10 mL    ALLERGIES:  Allergies  Allergen Reactions  . Bleomycin Other (See Comments)    Cramping all over     PHYSICAL EXAM:  ECOG Performance status: 1  Vital signs: Blood pressure 111/66 Pulse 108 Respirations 16 O2 sat 100% on room air   Physical Exam Constitutional:      Appearance: Normal appearance. He is normal weight.  Cardiovascular:     Rate and Rhythm: Regular rhythm. Tachycardia present.     Heart sounds: Normal heart sounds.  Pulmonary:     Effort: Pulmonary effort is normal.     Breath sounds: Normal breath sounds.  Abdominal:     General: Bowel sounds are normal.     Palpations: Abdomen is soft.  Musculoskeletal: Normal range of motion.  Skin:    General: Skin is warm and dry.  Neurological:     Mental Status: He is alert and oriented to person, place, and time. Mental status is at baseline.  Psychiatric:        Mood and Affect: Mood normal.        Behavior: Behavior normal.        Thought Content: Thought content normal.        Judgment: Judgment normal.      LABORATORY DATA:  I have reviewed the labs as listed.  CBC    Component Value Date/Time   WBC 7.0 08/03/2018 0746   RBC 4.22 08/03/2018 0746   HGB 11.7 (L) 08/03/2018 0746   HGB 16.1 05/03/2018 1553   HCT 36.5 (L) 08/03/2018 0746   HCT 49.8 05/03/2018 1553   PLT 346 08/03/2018 0746   PLT 280 05/03/2018 1553   MCV 86.5 08/03/2018 0746   MCV 82 05/03/2018 1553   MCH 27.7 08/03/2018 0746   MCHC 32.1 08/03/2018 0746   RDW 17.4 (H) 08/03/2018 0746   RDW 14.4 05/03/2018 1553   LYMPHSABS 1.7 08/03/2018 0746   LYMPHSABS 4.5 (H) 05/03/2018 1553   MONOABS 0.9 08/03/2018 0746   EOSABS 0.1 08/03/2018 0746  EOSABS 0.2 05/03/2018 1553   BASOSABS 0.1 08/03/2018 0746   BASOSABS  0.1 05/03/2018 1553   CMP Latest Ref Rng & Units 08/03/2018 07/13/2018 06/22/2018  Glucose 70 - 99 mg/dL 276(H) 275(H) 240(H)  BUN 6 - 20 mg/dL '12 13 17  ' Creatinine 0.61 - 1.24 mg/dL 0.92 1.07 1.04  Sodium 135 - 145 mmol/L 139 139 141  Potassium 3.5 - 5.1 mmol/L 3.7 3.7 3.6  Chloride 98 - 111 mmol/L 104 103 105  CO2 22 - 32 mmol/L '23 23 24  ' Calcium 8.9 - 10.3 mg/dL 8.9 9.1 9.3  Total Protein 6.5 - 8.1 g/dL 6.7 7.0 7.2  Total Bilirubin 0.3 - 1.2 mg/dL 0.4 0.6 0.3  Alkaline Phos 38 - 126 U/L 70 60 63  AST 15 - 41 U/L '17 20 20  ' ALT 0 - 44 U/L 27 35 31     I personally performed a face-to-face visit.  All questions were answered to patient's stated satisfaction. Encouraged patient to call with any new concerns or questions before his next visit to the cancer center and we can certain see him sooner, if needed.     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  NLP Hodgkin's lymphoma (recurrent 3B): -History of stage IV and LP HL with liver involvement diagnosed in 1989, treated with MOPP-ABVD.  He underwent splenectomy. - Recurrence in April 1992, treated with CCNU, etoposide and cisplatin with 6 cycles. - Presentation with right axillary mass, night sweats for 2 months, no fever or weight loss. -PET CT scan on 05/12/2018 showed hypermetabolic lymphadenopathy in the bilateral axillary, subpectoral region, right subclavicular region and portacaval space. -Biopsy of the right axillary lymph node on 05/10/2018 showed B-cell lymphoma phenotypically consistent with NLP HL. -Echocardiogram with a EF of 55 to 60%. -3 cycles of R-CHOP from 05/24/2018 through 07/13/2018. -We have reviewed results of interim staging PET CT scan from 07/26/2018.  Significant reduction in the size and FDG uptake associated with thoracic and upper abdominal adenopathy.  Residual FDG uptake within the thoracic lymph nodes is low level compatible with Deauville 2.  No evidence  of residual or recurrent metabolically active tumor. -He received Adriamycin 200 mg/m when he had his first chemotherapy in 1989.  We will try to limit his Adriamycin exposure to 550 mg/m. -Dexrazoxane was added to his regimen at 500 mg/m. -He reported some night sweats.  This will be closely monitored. -His last treatment was on 08/02/2018.  He is being seen today due to feeling more fatigued, decreased appetite, and constipation. -We will give him IV fluids for hydration and nausea medication.  Also will start him on a stool softener twice daily. - He may be seen weekly for IV fluids otherwise he will follow-up on 08/24/2018 for his next cycle and office visit with Dr. Delton Coombes.  2.  Postsplenectomy state: -Received Menactra and Pneumovax on 05/25/2018. -She received a flu shot this past season.  3.  Diabetes: - His sugars are running high since starting chemo and steroids. - His hemoglobin A1c was checked on 07/13/2018 which was elevated to 8.6. -He was started on glipizide XR 2.5 mg with breakfast.  We will titrate up as needed.      Orders placed this encounter:  No orders of the defined types were placed in this encounter.     Francene Finders, FNP-C [place] [phone]

## 2018-08-11 NOTE — Patient Instructions (Signed)
[facility] at [facility] Discharge Instructions  Received IV hydration with magnesium and potassium today. Follow-up as scheduled. Call clinic for any questions or concerns   Thank you for choosing [facility] at [facility] to provide your oncology and hematology care.  To afford each patient quality time with our provider, please arrive at least 15 minutes before your scheduled appointment time.   If you have a lab appointment with the Cancer Center please come in thru the  Main Entrance and check in at the main information desk  You need to re-schedule your appointment should you arrive 10 or more minutes late.  We strive to give you quality time with our providers, and arriving late affects you and other patients whose appointments are after yours.  Also, if you no show three or more times for appointments you may be dismissed from the clinic at the providers discretion.     Again, thank you for choosing [facility].  Our hope is that these requests will decrease the amount of time that you wait before being seen by our physicians.       _____________________________________________________________  Should you have questions after your visit to  Cancer Center, please contact our office at [phone] between the hours of 8:00 a.m. and 4:30 p.m.  Voicemails left after 4:00 p.m. will not be returned until the following business day.  For prescription refill requests, have your pharmacy contact our office and allow 72 hours.    Cancer Center Support Programs:   > Cancer Support Group  2nd Tuesday of the month 1pm-2pm, Journey Room   

## 2018-08-11 NOTE — Assessment & Plan Note (Addendum)
1.  NLP Hodgkin's lymphoma (recurrent 3B): -History of stage IV and LP HL with liver involvement diagnosed in 1989, treated with MOPP-ABVD.  He underwent splenectomy. - Recurrence in April 1992, treated with CCNU, etoposide and cisplatin with 6 cycles. - Presentation with right axillary mass, night sweats for 2 months, no fever or weight loss. -PET CT scan on 05/12/2018 showed hypermetabolic lymphadenopathy in the bilateral axillary, subpectoral region, right subclavicular region and portacaval space. -Biopsy of the right axillary lymph node on 05/10/2018 showed B-cell lymphoma phenotypically consistent with NLP HL. -Echocardiogram with a EF of 55 to 60%. -3 cycles of R-CHOP from 05/24/2018 through 07/13/2018. -We have reviewed results of interim staging PET CT scan from 07/26/2018.  Significant reduction in the size and FDG uptake associated with thoracic and upper abdominal adenopathy.  Residual FDG uptake within the thoracic lymph nodes is low level compatible with Deauville 2.  No evidence of residual or recurrent metabolically active tumor. -He received Adriamycin 200 mg/m when he had his first chemotherapy in 1989.  We will try to limit his Adriamycin exposure to 550 mg/m. -Dexrazoxane was added to his regimen at 500 mg/m. -He reported some night sweats.  This will be closely monitored. -His last treatment was on 08/02/2018.  He is being seen today due to feeling more fatigued, decreased appetite, and constipation. -We will give him IV fluids for hydration and nausea medication.  Also will start him on a stool softener twice daily. - He may be seen weekly for IV fluids otherwise he will follow-up on 08/24/2018 for his next cycle and office visit with Dr. Delton Coombes.  2.  Postsplenectomy state: -Received Menactra and Pneumovax on 05/25/2018. -She received a flu shot this past season.  3.  Diabetes: - His sugars are running high since starting chemo and steroids. - His hemoglobin A1c was checked on  07/13/2018 which was elevated to 8.6. -He was started on glipizide XR 2.5 mg with breakfast.  We will titrate up as needed.

## 2018-08-11 NOTE — Progress Notes (Signed)
Patient called the clinic reporting that he still is not feeling well.  He reports that he has not bounced back from treatment this time like he has in the past.  He was able to eat some yesterday more than the past few days but he still is fatigued, weak and no energy.  Patient was scheduled for fluids and a visit with our NP this afternoon.

## 2018-08-11 NOTE — Patient Instructions (Signed)
[facility] at [facility] Discharge Instructions   Follow up in 2 weeks with labs and treatment  Thank you for choosing [facility] at [facility] to provide your oncology and hematology care.  To afford each patient quality time with our provider, please arrive at least 15 minutes before your scheduled appointment time.   If you have a lab appointment with the Cancer Center please come in thru the  Main Entrance and check in at the main information desk  You need to re-schedule your appointment should you arrive 10 or more minutes late.  We strive to give you quality time with our providers, and arriving late affects you and other patients whose appointments are after yours.  Also, if you no show three or more times for appointments you may be dismissed from the clinic at the providers discretion.     Again, thank you for choosing [facility].  Our hope is that these requests will decrease the amount of time that you wait before being seen by our physicians.       _____________________________________________________________  Should you have questions after your visit to [facility], please contact our office at [phone] between the hours of 8:00 a.m. and 4:30 p.m.  Voicemails left after 4:00 p.m. will not be returned until the following business day.  For prescription refill requests, have your pharmacy contact our office and allow 72 hours.    Cancer Center Support Programs:   > Cancer Support Group  2nd Tuesday of the month 1pm-2pm, Journey Room    

## 2018-08-11 NOTE — Progress Notes (Signed)
Devin Tran tolerated IV hydration with potassium and magnesium well without complaints or incident. VSS upon discharge. Pt discharged self ambulatory in satisfactory condition

## 2018-08-18 ENCOUNTER — Other Ambulatory Visit: Payer: Self-pay

## 2018-08-18 ENCOUNTER — Ambulatory Visit (HOSPITAL_COMMUNITY)
Admission: RE | Admit: 2018-08-18 | Discharge: 2018-08-18 | Disposition: A | Discharge status: Home / Self Care | Payer: BC Managed Care – PPO | Source: Ambulatory Visit | Attending: Hematology | Admitting: Hematology

## 2018-08-18 DIAGNOSIS — C8108 Nodular lymphocyte predominant Hodgkin lymphoma, lymph nodes of multiple sites: Secondary | ICD-10-CM | POA: Diagnosis not present

## 2018-08-18 NOTE — Progress Notes (Signed)
*  PRELIMINARY RESULTS* Echocardiogram 2D Echocardiogram has been performed.  Devin Tran 08/18/2018, 10:14 AM

## 2018-08-19 ENCOUNTER — Other Ambulatory Visit (HOSPITAL_COMMUNITY): Payer: Self-pay | Admitting: Nurse Practitioner

## 2018-08-19 DIAGNOSIS — C8108 Nodular lymphocyte predominant Hodgkin lymphoma, lymph nodes of multiple sites: Secondary | ICD-10-CM

## 2018-08-24 ENCOUNTER — Inpatient Hospital Stay (HOSPITAL_COMMUNITY): Payer: BC Managed Care – PPO

## 2018-08-24 ENCOUNTER — Inpatient Hospital Stay (HOSPITAL_BASED_OUTPATIENT_CLINIC_OR_DEPARTMENT_OTHER): Payer: BC Managed Care – PPO | Admitting: Hematology

## 2018-08-24 ENCOUNTER — Other Ambulatory Visit: Payer: Self-pay

## 2018-08-24 ENCOUNTER — Encounter (HOSPITAL_COMMUNITY): Payer: Self-pay | Admitting: Hematology

## 2018-08-24 VITALS — BP 112/62 | HR 62 | Temp 97.1°F | Resp 16

## 2018-08-24 DIAGNOSIS — Z5111 Encounter for antineoplastic chemotherapy: Secondary | ICD-10-CM | POA: Diagnosis not present

## 2018-08-24 DIAGNOSIS — C8108 Nodular lymphocyte predominant Hodgkin lymphoma, lymph nodes of multiple sites: Secondary | ICD-10-CM

## 2018-08-24 DIAGNOSIS — R5383 Other fatigue: Secondary | ICD-10-CM

## 2018-08-24 DIAGNOSIS — K5900 Constipation, unspecified: Secondary | ICD-10-CM

## 2018-08-24 DIAGNOSIS — E119 Type 2 diabetes mellitus without complications: Secondary | ICD-10-CM | POA: Diagnosis not present

## 2018-08-24 DIAGNOSIS — Z5189 Encounter for other specified aftercare: Secondary | ICD-10-CM | POA: Diagnosis not present

## 2018-08-24 DIAGNOSIS — Z5112 Encounter for antineoplastic immunotherapy: Secondary | ICD-10-CM | POA: Diagnosis not present

## 2018-08-24 DIAGNOSIS — Z452 Encounter for adjustment and management of vascular access device: Secondary | ICD-10-CM | POA: Diagnosis not present

## 2018-08-24 LAB — CBC WITH DIFFERENTIAL/PLATELET
Abs Immature Granulocytes: 0.14 10*3/uL — ABNORMAL HIGH (ref 0.00–0.07)
Basophils Absolute: 0.1 10*3/uL (ref 0.0–0.1)
Basophils Relative: 1 %
Eosinophils Absolute: 0.1 10*3/uL (ref 0.0–0.5)
Eosinophils Relative: 2 %
HCT: 30.8 % — ABNORMAL LOW (ref 39.0–52.0)
Hemoglobin: 9.7 g/dL — ABNORMAL LOW (ref 13.0–17.0)
Immature Granulocytes: 2 %
Lymphocytes Relative: 12 %
Lymphs Abs: 1 10*3/uL (ref 0.7–4.0)
MCH: 28 pg (ref 26.0–34.0)
MCHC: 31.5 g/dL (ref 30.0–36.0)
MCV: 88.8 fL (ref 80.0–100.0)
Monocytes Absolute: 0.8 10*3/uL (ref 0.1–1.0)
Monocytes Relative: 10 %
Neutro Abs: 5.8 10*3/uL (ref 1.7–7.7)
Neutrophils Relative %: 73 %
Platelets: 507 10*3/uL — ABNORMAL HIGH (ref 150–400)
RBC: 3.47 MIL/uL — ABNORMAL LOW (ref 4.22–5.81)
RDW: 17.7 % — ABNORMAL HIGH (ref 11.5–15.5)
WBC: 8 10*3/uL (ref 4.0–10.5)
nRBC: 0.4 % — ABNORMAL HIGH (ref 0.0–0.2)

## 2018-08-24 LAB — COMPREHENSIVE METABOLIC PANEL
ALT: 16 U/L (ref 0–44)
AST: 18 U/L (ref 15–41)
Albumin: 3.9 g/dL (ref 3.5–5.0)
Alkaline Phosphatase: 63 U/L (ref 38–126)
Anion gap: 10 (ref 5–15)
BUN: 14 mg/dL (ref 6–20)
CO2: 22 mmol/L (ref 22–32)
Calcium: 8.9 mg/dL (ref 8.9–10.3)
Chloride: 108 mmol/L (ref 98–111)
Creatinine, Ser: 0.92 mg/dL (ref 0.61–1.24)
GFR calc Af Amer: >60 mL/min (ref >60)
GFR calc non Af Amer: >60 mL/min (ref >60)
Glucose, Bld: 248 mg/dL — ABNORMAL HIGH (ref 70–99)
Potassium: 3.4 mmol/L — ABNORMAL LOW (ref 3.5–5.1)
Sodium: 140 mmol/L (ref 135–145)
Total Bilirubin: 0.4 mg/dL (ref 0.3–1.2)
Total Protein: 6.6 g/dL (ref 6.5–8.1)

## 2018-08-24 MED ORDER — ACETAMINOPHEN 325 MG PO TABS
650.0000 mg | ORAL_TABLET | Freq: Once | ORAL | Status: AC
  Administered 2018-08-24: 650 mg via ORAL
  Filled 2018-08-24: qty 2

## 2018-08-24 MED ORDER — DOXORUBICIN HCL CHEMO IV INJECTION 2 MG/ML
50.0000 mg/m2 | Freq: Once | INTRAVENOUS | Status: AC
  Administered 2018-08-24: 118 mg via INTRAVENOUS
  Filled 2018-08-24: qty 59

## 2018-08-24 MED ORDER — LACTATED RINGERS IV SOLN
500.0000 mg/m2 | Freq: Once | INTRAVENOUS | Status: AC
  Administered 2018-08-24: 1180 mg via INTRAVENOUS
  Filled 2018-08-24: qty 118

## 2018-08-24 MED ORDER — HEPARIN SOD (PORK) LOCK FLUSH 100 UNIT/ML IV SOLN
500.0000 [IU] | Freq: Once | INTRAVENOUS | Status: AC | PRN
  Administered 2018-08-24: 500 [IU]

## 2018-08-24 MED ORDER — DIPHENHYDRAMINE HCL 25 MG PO CAPS
50.0000 mg | ORAL_CAPSULE | Freq: Once | ORAL | Status: AC
  Administered 2018-08-24: 50 mg via ORAL
  Filled 2018-08-24: qty 2

## 2018-08-24 MED ORDER — SODIUM CHLORIDE 0.9 % IV SOLN
10.0000 mg | Freq: Once | INTRAVENOUS | Status: AC
  Administered 2018-08-24: 10 mg via INTRAVENOUS
  Filled 2018-08-24: qty 10

## 2018-08-24 MED ORDER — SODIUM CHLORIDE 0.9% FLUSH
10.0000 mL | INTRAVENOUS | Status: DC | PRN
  Administered 2018-08-24: 10 mL
  Filled 2018-08-24: qty 10

## 2018-08-24 MED ORDER — SODIUM CHLORIDE 0.9 % IV SOLN
Freq: Once | INTRAVENOUS | Status: AC
  Administered 2018-08-24: 09:00:00 via INTRAVENOUS

## 2018-08-24 MED ORDER — ALTEPLASE 2 MG IJ SOLR
2.0000 mg | Freq: Once | INTRAMUSCULAR | Status: AC
  Administered 2018-08-24: 2 mg

## 2018-08-24 MED ORDER — VINCRISTINE SULFATE CHEMO INJECTION 1 MG/ML
2.0000 mg | Freq: Once | INTRAVENOUS | Status: AC
  Administered 2018-08-24: 2 mg via INTRAVENOUS
  Filled 2018-08-24: qty 2

## 2018-08-24 MED ORDER — SODIUM CHLORIDE 0.9 % IV SOLN
750.0000 mg/m2 | Freq: Once | INTRAVENOUS | Status: AC
  Administered 2018-08-24: 1760 mg via INTRAVENOUS
  Filled 2018-08-24: qty 88

## 2018-08-24 MED ORDER — STERILE WATER FOR INJECTION IJ SOLN
INTRAMUSCULAR | Status: AC
  Filled 2018-08-24: qty 10

## 2018-08-24 MED ORDER — PALONOSETRON HCL INJECTION 0.25 MG/5ML
0.2500 mg | Freq: Once | INTRAVENOUS | Status: AC
  Administered 2018-08-24: 0.25 mg via INTRAVENOUS
  Filled 2018-08-24: qty 5

## 2018-08-24 MED ORDER — SODIUM CHLORIDE 0.9 % IV SOLN
375.0000 mg/m2 | Freq: Once | INTRAVENOUS | Status: AC
  Administered 2018-08-24: 900 mg via INTRAVENOUS
  Filled 2018-08-24: qty 40

## 2018-08-24 MED ORDER — PEGFILGRASTIM 6 MG/0.6ML ~~LOC~~ PSKT
6.0000 mg | PREFILLED_SYRINGE | Freq: Once | SUBCUTANEOUS | Status: AC
  Administered 2018-08-24: 6 mg via SUBCUTANEOUS
  Filled 2018-08-24: qty 0.6

## 2018-08-24 MED ORDER — FAMOTIDINE IN NACL 20-0.9 MG/50ML-% IV SOLN
20.0000 mg | Freq: Once | INTRAVENOUS | Status: AC
  Administered 2018-08-24: 20 mg via INTRAVENOUS
  Filled 2018-08-24: qty 50

## 2018-08-24 MED ORDER — ALTEPLASE 2 MG IJ SOLR
INTRAMUSCULAR | Status: AC
  Filled 2018-08-24: qty 2

## 2018-08-24 NOTE — Patient Instructions (Signed)
[facility] at [facility] Discharge Instructions  You were seen today by Dr. Katragadda. He went over your recent lab results. He will see you back in 3 weeks for labs and follow up.   Thank you for choosing [facility] at [facility] to provide your oncology and hematology care.  To afford each patient quality time with our provider, please arrive at least 15 minutes before your scheduled appointment time.   If you have a lab appointment with the Cancer Center please come in thru the  Main Entrance and check in at the main information desk  You need to re-schedule your appointment should you arrive 10 or more minutes late.  We strive to give you quality time with our providers, and arriving late affects you and other patients whose appointments are after yours.  Also, if you no show three or more times for appointments you may be dismissed from the clinic at the providers discretion.     Again, thank you for choosing [facility].  Our hope is that these requests will decrease the amount of time that you wait before being seen by our physicians.       _____________________________________________________________  Should you have questions after your visit to [facility], please contact our office at [phone] between the hours of 8:00 a.m. and 4:30 p.m.  Voicemails left after 4:00 p.m. will not be returned until the following business day.  For prescription refill requests, have your pharmacy contact our office and allow 72 hours.    Cancer Center Support Programs:   > Cancer Support Group  2nd Tuesday of the month 1pm-2pm, Journey Room    

## 2018-08-24 NOTE — Progress Notes (Signed)
Patients port would not give a blood return. Labs drawn peripherally and Dr. Delton Coombes notified.    Alteplase used per policy with good blood return.  Waste of 31ml blood before flushed with normal saline.  Dr. Delton Coombes notified of good blood return.

## 2018-08-24 NOTE — Assessment & Plan Note (Addendum)
1.  NLP Hodgkin's lymphoma (recurrent 3B): -History of stage IV NLP Hodgkin's lymphoma with liver involvement diagnosed in 1989, treated with MOPP-ABVD.  He underwent splenectomy. -Recurrence in April 1992, treated with CCNU, etoposide and cisplatin for 6 cycles. - Presentation in March with right axillary mass, night sweats for 2 months and no fever or weight loss. -PET scan on 05/12/2018 showed hypermetabolic lymphadenopathy in the bilateral axillary, subpectoral region, right sub-clavicular region, and portacaval space. -Biopsy of the right axillary lymph node on 05/10/2018 showed B-cell lymphoma, phenotypically consistent with NLP HL. - 4 cycles of R-CHOP from 05/24/2018 through 08/03/2018. - Interim staging PET scan on 07/26/2018 shows significant reduction in size and FDG uptake associated with thoracic and upper abdominal adenopathy.  Residual FDG uptake within the thoracic lymph nodes is low level compatible with Deauville 2.  No evidence of residual or recurrent metabolic active tumor. -He had some nausea after last cycle along with some constipation. -I have reviewed his blood work.  He may proceed with his cycle 5 today.  I have instructed him to take Compazine every 6 hours for the next 2 to 3 days. -We will see him back in 3 weeks for follow-up.  I plan to repeat PET scan after cycle 6.  2.  Cardioprotection: - He received Adriamycin 200 mg per metered square when he received chemotherapy in 1989.  We will plan to limit Adriamycin exposure to 50 mg per metered square. - We added dexrazoxane during cycle 4 at 500 mg/m2. -Echocardiogram on 08/18/2018 shows EF of 60 to 65%.  3.  Post splenectomy state: -Received Menactra and Pneumovax on 05/25/2018. -He received flu shot past season.  4.  Diabetes: - Hemoglobin A1c on 07/13/2018 was high at 8.6. - He is currently taking glipizide XR 2.5 mg with breakfast.  I will increase it to 5 mg daily.

## 2018-08-24 NOTE — Progress Notes (Signed)
Patient seen by Dr. Delton Coombes with lab review and ok to treat today.    Patient tolerated chemotherapy with no complaints voiced.  Port site clean and dry with no bruising or swelling noted at site.  Good blood return noted before, during, and after administration of adriamycin.  Blood return checked before administration of vincristine with good blood return noted at end of chemotherapy.  Neulasta onpro applied with green indicator light flashing.    Band aid applied.  Patient left ambulatory with VSS and no s/s of distress noted.

## 2018-08-24 NOTE — Progress Notes (Signed)
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 ([place])   06/01/2018 -  Chemotherapy   The patient had DOXOrubicin (ADRIAMYCIN) chemo injection 118 mg, 50 mg/m2 = 118 mg, Intravenous,  Once, 4 of 6 cycles Administration: 118 mg (06/01/2018), 118 mg (06/22/2018), 118 mg (07/13/2018), 118 mg (08/03/2018) palonosetron (ALOXI) injection 0.25 mg, 0.25 mg, Intravenous,  Once, 4 of 6 cycles Administration: 0.25 mg (06/01/2018), 0.25 mg (06/22/2018), 0.25 mg (07/13/2018), 0.25 mg (08/03/2018) pegfilgrastim (NEULASTA ONPRO KIT) injection 6 mg, 6 mg, Subcutaneous, Once, 4 of 6 cycles Administration: 6 mg (06/01/2018), 6 mg (06/22/2018), 6 mg (07/13/2018), 6 mg (08/03/2018) vinCRIStine (ONCOVIN) 2 mg in sodium chloride 0.9 % 50 mL chemo infusion, 2 mg, Intravenous,  Once, 4 of 6 cycles Administration: 2 mg (06/01/2018), 2 mg (06/22/2018), 2 mg (07/13/2018), 2 mg (08/03/2018) riTUXimab (RITUXAN) 900 mg in sodium chloride 0.9 % 250 mL (2.6471 mg/mL) infusion, 375 mg/m2 = 900 mg, Intravenous,  Once, 1 of 1 cycle Administration: 900 mg (06/01/2018) cyclophosphamide (CYTOXAN) 1,760 mg in sodium chloride 0.9 % 250 mL chemo infusion, 750 mg/m2 = 1,760 mg, Intravenous,  Once, 4 of 6 cycles Administration: 1,760 mg (06/01/2018), 1,760 mg (06/22/2018), 1,760 mg (07/13/2018), 1,760 mg (08/03/2018) riTUXimab (RITUXAN) 900 mg in sodium chloride 0.9 % 160 mL infusion, 375 mg/m2 = 900 mg (100 % of original dose 375 mg/m2), Intravenous,  Once, 3 of 5 cycles Dose modification: 375 mg/m2 (original dose 375 mg/m2, Cycle 2) Administration:  900 mg (06/22/2018), 900 mg (07/13/2018), 900 mg (08/03/2018) dexrazoxane (ZINECARD) 1,180 mg in lactated ringers 300 mL IVPB, 500 mg/m2 = 1,180 mg (100 % of original dose 500 mg/m2), Intravenous,  Once, 1 of 3 cycles Dose modification: 500 mg/m2 (original dose 500 mg/m2, Cycle 4), 500 mg/m2 (original dose 500 mg/m2, Cycle 4) Administration: 1,180 mg (08/03/2018)  for chemotherapy treatment.       CANCER STAGING: Cancer Staging No matching staging information was found for the patient.   INTERVAL HISTORY:  Devin Tran 53 y.o. male seen for follow-up and cycle 5 of chemotherapy.  Cycle 4 of chemotherapy was on 08/03/2018.  He was evaluated in our clinic for increased fatigue, decreased appetite and constipation on 08/11/2018.  He did receive IV hydration and was started on stool softener twice daily.  Constipation and nausea are better at this time.  He is taking Glucotrol XL 2.5 mg daily with breakfast.  Appetite is 75%.  Energy levels are 75%.  Pain is reported as 0.  No symptoms of PND or orthopnea.    REVIEW OF SYSTEMS:  Review of Systems  Gastrointestinal: Positive for constipation and nausea.  Neurological: Positive for numbness.  All other systems reviewed and are negative.    PAST MEDICAL/SURGICAL HISTORY:  Past Medical History:  Diagnosis Date  . Complication of anesthesia   . Erectile dysfunction   . Hodgkin's lymphoma ([place])   . Hyperlipidemia   . Hypertension   . Infertility   . Low testosterone   . PONV (postoperative nausea and vomiting)    Past Surgical History:  Procedure Laterality Date  . AXILLARY  LYMPH NODE BIOPSY Right 05/10/2018   Procedure: AXILLARY LYMPH NODE BIOPSY, RIGHT;  Surgeon: Aviva Signs, MD;  Location: [facility];  Service: General;  Laterality: Right;  . IR LYMPHANGIOGRAM EXTREMITY UNI LEFT  03/02/1987  . PORTA CATH INSERTION    . PORTA CATH REMOVAL    . PORTACATH PLACEMENT Left 05/21/2018   Procedure: INSERTION PORT-A-CATH (attached catheter in left  subclavian);  Surgeon: Aviva Signs, MD;  Location: [facility];  Service: General;  Laterality: Left;  . SPLENECTOMY Left Over 20 years ago   Due to Hodgkin's lymphoma     SOCIAL HISTORY:  Social History   Socioeconomic History  . Marital status: Single    Spouse name: Not on file  . Number of children: Not on file  . Years of education: Not on file  . Highest education level: Not on file  Occupational History  . Occupation: Drive Chief Operating Officer: [employer]  . Financial resource strain: Not on file  . Food insecurity    Worry: Not on file    Inability: Not on file  . Transportation needs    Medical: Not on file    Non-medical: Not on file  Tobacco Use  . Smoking status: Never Smoker  . Smokeless tobacco: Never Used  Substance and Sexual Activity  . Alcohol use: No  . Drug use: No  . Sexual activity: Yes  Lifestyle  . Physical activity    Days per week: Not on file    Minutes per session: Not on file  . Stress: Not on file  Relationships  . Social Herbalist on phone: Not on file    Gets together: Not on file    Attends religious service: Not on file    Active member of club or organization: Not on file    Attends meetings of clubs or organizations: Not on file    Relationship status: Not on file  . Intimate partner violence    Fear of current or ex partner: Not on file    Emotionally abused: Not on file    Physically abused: Not on file    Forced sexual activity: Not on file  Other Topics Concern  . Not on file  Social History Narrative  . Not on file    FAMILY HISTORY:  Family History  Problem Relation Age of Onset  . Hypertension Mother   . Cancer Father        lung  . Hypertension Father   . Diabetes Father   . COPD Father   . Hypertension Brother   . Stroke Brother     CURRENT MEDICATIONS:  Outpatient Encounter Medications as of 08/24/2018  Medication Sig  . allopurinol (ZYLOPRIM) 300 MG tablet TAKE 1  TABLET BY MOUTH EVERY DAY  . amLODipine (NORVASC) 10 MG tablet TAKE 1 TABLET BY MOUTH EVERY DAY (Patient taking differently: Take 5 mg by mouth daily. TAKE 1 TABLET BY MOUTH EVERY DAY)  . Ascorbic Acid (VITAMIN C) 1000 MG tablet Take 1,000 mg by mouth daily. Vitamin c lozenge  . aspirin EC 81 MG tablet Take 81 mg by mouth daily.   . CYCLOPHOSPHAMIDE IV Inject into the vein every 21 ( twenty-one) days.  Marland Kitchen DOXOrubicin (ADRIAMYCIN) 2 MG/ML injection Inject into the vein every 21 ( twenty-one) days.  Marland Kitchen glipiZIDE (GLUCOTROL XL) 2.5 MG 24 hr tablet Take 1 tablet (2.5 mg total) by mouth daily with breakfast.  . HYDROcodone-acetaminophen (Alfred)  5-325 MG tablet Take 1 tablet by mouth every 4 (four) hours as needed for moderate pain.  Marland Kitchen lidocaine-prilocaine (EMLA) cream Apply pea-sized amount to port a cath site and cover with plastic wrap one hour prior to each chemotherapy appointment  . Multiple Vitamins-Minerals (ONE-A-DAY MENS HEALTH FORMULA PO) Take 1 tablet by mouth daily.   . ondansetron (ZOFRAN-ODT) 8 MG disintegrating tablet   . predniSONE (DELTASONE) 20 MG tablet Take 5 tablets (100 mg total) by mouth daily. Take on days 1-5 of chemotherapy.  . prochlorperazine (COMPAZINE) 10 MG tablet Take 1 tablet (10 mg total) by mouth every 6 (six) hours as needed (Nausea or vomiting).  . Propylene Glycol (SYSTANE BALANCE) 0.6 % SOLN Place 1 drop into both eyes 2 (two) times daily as needed (dry eyes).  . riTUXimab (RITUXAN IV) Inject into the vein every 21 ( twenty-one) days.  . vinCRIStine 2 mg in sodium chloride 0.9 % 50 mL Inject 2 mg into the vein every 21 ( twenty-one) days.   No facility-administered encounter medications on file as of 08/24/2018.     ALLERGIES:  Allergies  Allergen Reactions  . Bleomycin Other (See Comments)    Cramping all over     PHYSICAL EXAM:  ECOG Performance status: 1  Vitals:   08/24/18 0746  BP: 138/76  Pulse: 82  Resp: 18  Temp: (!) 97.1 F (36.2 C)  SpO2:  99%   Filed Weights   08/24/18 0746  Weight: 221 lb 6.4 oz (100.4 kg)    Physical Exam Vitals signs reviewed.  Constitutional:      Appearance: Normal appearance.  Cardiovascular:     Rate and Rhythm: Normal rate and regular rhythm.     Heart sounds: Normal heart sounds.  Pulmonary:     Effort: Pulmonary effort is normal.     Breath sounds: Normal breath sounds.  Abdominal:     General: There is no distension.     Palpations: Abdomen is soft. There is no mass.  Musculoskeletal:        General: No swelling.  Lymphadenopathy:     Cervical: No cervical adenopathy.  Skin:    General: Skin is warm.  Neurological:     General: No focal deficit present.     Mental Status: He is alert and oriented to person, place, and time.  Psychiatric:        Mood and Affect: Mood normal.        Behavior: Behavior normal.      LABORATORY DATA:  I have reviewed the labs as listed.  CBC    Component Value Date/Time   WBC 7.0 08/03/2018 0746   RBC 4.22 08/03/2018 0746   HGB 11.7 (L) 08/03/2018 0746   HGB 16.1 05/03/2018 1553   HCT 36.5 (L) 08/03/2018 0746   HCT 49.8 05/03/2018 1553   PLT 346 08/03/2018 0746   PLT 280 05/03/2018 1553   MCV 86.5 08/03/2018 0746   MCV 82 05/03/2018 1553   MCH 27.7 08/03/2018 0746   MCHC 32.1 08/03/2018 0746   RDW 17.4 (H) 08/03/2018 0746   RDW 14.4 05/03/2018 1553   LYMPHSABS 1.7 08/03/2018 0746   LYMPHSABS 4.5 (H) 05/03/2018 1553   MONOABS 0.9 08/03/2018 0746   EOSABS 0.1 08/03/2018 0746   EOSABS 0.2 05/03/2018 1553   BASOSABS 0.1 08/03/2018 0746   BASOSABS 0.1 05/03/2018 1553   CMP Latest Ref Rng & Units 08/03/2018 07/13/2018 06/22/2018  Glucose 70 - 99 mg/dL 276(H) 275(H) 240(H)  BUN  6 - 20 mg/dL _0 Creatinine 0.61 - 1.24 mg/dL 0.92 1.07 1.04  Sodium 135 - 145 mmol/L 139 139 141  Potassium 3.5 - 5.1 mmol/L 3.7 3.7 3.6  Chloride 98 - 111 mmol/L 104 103 105  CO2 22 - 32 mmol/L _1 Calcium 8.9 - 10.3 mg/dL 8.9 9.1 9.3  Total  Protein 6.5 - 8.1 g/dL 6.7 7.0 7.2  Total Bilirubin 0.3 - 1.2 mg/dL 0.4 0.6 0.3  Alkaline Phos 38 - 126 U/L 70 60 63  AST 15 - 41 U/L _2 ALT 0 - 44 U/L 27 35 31       DIAGNOSTIC IMAGING:  I have independently reviewed the scans and discussed with the patient.   I have reviewed Venita Lick LPN's note and agree with the documentation.  I personally performed a face-to-face visit, made revisions and my assessment and plan is as follows.    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  NLP Hodgkin's lymphoma (recurrent 3B): -History of stage IV NLP Hodgkin's lymphoma with liver involvement diagnosed in 1989, treated with MOPP-ABVD.  He underwent splenectomy. -Recurrence in April 1992, treated with CCNU, etoposide and cisplatin for 6 cycles. - Presentation in March with right axillary mass, night sweats for 2 months and no fever or weight loss. -PET scan on 05/12/2018 showed hypermetabolic lymphadenopathy in the bilateral axillary, subpectoral region, right sub-clavicular region, and portacaval space. -Biopsy of the right axillary lymph node on 05/10/2018 showed B-cell lymphoma, phenotypically consistent with NLP HL. - 4 cycles of R-CHOP from 05/24/2018 through 08/03/2018. - Interim staging PET scan on 07/26/2018 shows significant reduction in size and FDG uptake associated with thoracic and upper abdominal adenopathy.  Residual FDG uptake within the thoracic lymph nodes is low level compatible with [place] 2.  No evidence of residual or recurrent metabolic active tumor.  2.  Cardioprotection: - He received Adriamycin 200 mg per metered square when he received chemotherapy in 1989.  We will plan to limit Adriamycin exposure to 50 mg per metered square. - We added dexrazoxane during cycle 4 at 500 mg/m2.  3.  Post splenectomy state: -Received Menactra and Pneumovax on 05/25/2018. -He received flu shot past season.  4.   Diabetes: - Hemoglobin A1c on 07/13/2018 was high at 8.6. - I started him on glipizide XR 2.5 mg with breakfast.  We will titrated up as needed.  Total time spent is 25 minutes percent of the time spent face-to-face discussing labs, treatment plan, counseling and coordination of care.    Orders placed this encounter:  No orders of the defined types were placed in this encounter.     Derek Jack, MD [place] [phone]

## 2018-08-26 ENCOUNTER — Other Ambulatory Visit (HOSPITAL_COMMUNITY): Payer: Self-pay | Admitting: Hematology

## 2018-08-26 ENCOUNTER — Other Ambulatory Visit (HOSPITAL_COMMUNITY): Payer: Self-pay | Admitting: *Deleted

## 2018-08-26 DIAGNOSIS — C8108 Nodular lymphocyte predominant Hodgkin lymphoma, lymph nodes of multiple sites: Secondary | ICD-10-CM

## 2018-08-26 DIAGNOSIS — E119 Type 2 diabetes mellitus without complications: Secondary | ICD-10-CM

## 2018-08-29 ENCOUNTER — Other Ambulatory Visit (HOSPITAL_COMMUNITY): Payer: Self-pay | Admitting: Hematology

## 2018-08-29 DIAGNOSIS — E119 Type 2 diabetes mellitus without complications: Secondary | ICD-10-CM

## 2018-09-03 ENCOUNTER — Emergency Department (HOSPITAL_COMMUNITY)
Admission: EM | Admit: 2018-09-03 | Discharge: 2018-09-04 | Disposition: A | Discharge status: Home / Self Care | Payer: BC Managed Care – PPO | Attending: Emergency Medicine | Admitting: Emergency Medicine

## 2018-09-03 ENCOUNTER — Encounter (HOSPITAL_COMMUNITY): Payer: Self-pay | Admitting: Emergency Medicine

## 2018-09-03 ENCOUNTER — Other Ambulatory Visit: Payer: Self-pay

## 2018-09-03 DIAGNOSIS — R509 Fever, unspecified: Secondary | ICD-10-CM | POA: Diagnosis not present

## 2018-09-03 DIAGNOSIS — C8190 Hodgkin lymphoma, unspecified, unspecified site: Secondary | ICD-10-CM | POA: Insufficient documentation

## 2018-09-03 DIAGNOSIS — Z79899 Other long term (current) drug therapy: Secondary | ICD-10-CM | POA: Insufficient documentation

## 2018-09-03 DIAGNOSIS — Z20828 Contact with and (suspected) exposure to other viral communicable diseases: Secondary | ICD-10-CM | POA: Diagnosis not present

## 2018-09-03 DIAGNOSIS — Z7982 Long term (current) use of aspirin: Secondary | ICD-10-CM | POA: Diagnosis not present

## 2018-09-03 DIAGNOSIS — I10 Essential (primary) hypertension: Secondary | ICD-10-CM | POA: Diagnosis not present

## 2018-09-03 NOTE — ED Triage Notes (Signed)
Pt states he had a temp at home of 99.6 (took 2 Tylenol) and a sore throat that started today. Pt states he is currently undergoing chemotherapy here at [facility].

## 2018-09-04 ENCOUNTER — Emergency Department (HOSPITAL_COMMUNITY): Payer: BC Managed Care – PPO

## 2018-09-04 DIAGNOSIS — R509 Fever, unspecified: Secondary | ICD-10-CM | POA: Diagnosis not present

## 2018-09-04 LAB — URINALYSIS, ROUTINE W REFLEX MICROSCOPIC
Bacteria, UA: NONE SEEN
Bilirubin Urine: NEGATIVE
Glucose, UA: >=500 mg/dL — AB
Hgb urine dipstick: NEGATIVE
Ketones, ur: NEGATIVE mg/dL
Leukocytes,Ua: NEGATIVE
Nitrite: NEGATIVE
Protein, ur: NEGATIVE mg/dL
Specific Gravity, Urine: 1.02 (ref 1.005–1.030)
pH: 5 (ref 5.0–8.0)

## 2018-09-04 LAB — CBC WITH DIFFERENTIAL/PLATELET
Abs Immature Granulocytes: 0.43 10*3/uL — ABNORMAL HIGH (ref 0.00–0.07)
Basophils Absolute: 0.1 10*3/uL (ref 0.0–0.1)
Basophils Relative: 1 %
Eosinophils Absolute: 0 10*3/uL (ref 0.0–0.5)
Eosinophils Relative: 1 %
HCT: 30.7 % — ABNORMAL LOW (ref 39.0–52.0)
Hemoglobin: 10.2 g/dL — ABNORMAL LOW (ref 13.0–17.0)
Immature Granulocytes: 7 %
Lymphocytes Relative: 29 %
Lymphs Abs: 1.8 10*3/uL (ref 0.7–4.0)
MCH: 28.3 pg (ref 26.0–34.0)
MCHC: 33.2 g/dL (ref 30.0–36.0)
MCV: 85.3 fL (ref 80.0–100.0)
Monocytes Absolute: 1.4 10*3/uL — ABNORMAL HIGH (ref 0.1–1.0)
Monocytes Relative: 22 %
Neutro Abs: 2.6 10*3/uL (ref 1.7–7.7)
Neutrophils Relative %: 40 %
Platelets: 146 10*3/uL — ABNORMAL LOW (ref 150–400)
RBC: 3.6 MIL/uL — ABNORMAL LOW (ref 4.22–5.81)
RDW: 16.7 % — ABNORMAL HIGH (ref 11.5–15.5)
WBC Morphology: INCREASED
WBC: 6.4 10*3/uL (ref 4.0–10.5)
nRBC: 1.3 % — ABNORMAL HIGH (ref 0.0–0.2)

## 2018-09-04 LAB — COMPREHENSIVE METABOLIC PANEL
ALT: 17 U/L (ref 0–44)
AST: 16 U/L (ref 15–41)
Albumin: 4.2 g/dL (ref 3.5–5.0)
Alkaline Phosphatase: 80 U/L (ref 38–126)
Anion gap: 10 (ref 5–15)
BUN: 12 mg/dL (ref 6–20)
CO2: 22 mmol/L (ref 22–32)
Calcium: 8.7 mg/dL — ABNORMAL LOW (ref 8.9–10.3)
Chloride: 104 mmol/L (ref 98–111)
Creatinine, Ser: 1.04 mg/dL (ref 0.61–1.24)
GFR calc Af Amer: >60 mL/min (ref >60)
GFR calc non Af Amer: >60 mL/min (ref >60)
Glucose, Bld: 256 mg/dL — ABNORMAL HIGH (ref 70–99)
Potassium: 3.1 mmol/L — ABNORMAL LOW (ref 3.5–5.1)
Sodium: 136 mmol/L (ref 135–145)
Total Bilirubin: 0.3 mg/dL (ref 0.3–1.2)
Total Protein: 6.9 g/dL (ref 6.5–8.1)

## 2018-09-04 LAB — LACTIC ACID, PLASMA
Lactic Acid, Venous: 1.6 mmol/L (ref 0.5–1.9)
Lactic Acid, Venous: 2.7 mmol/L (ref 0.5–1.9)

## 2018-09-04 LAB — SARS CORONAVIRUS 2 BY RT PCR (HOSPITAL ORDER, PERFORMED IN ~~LOC~~ HOSPITAL LAB): SARS Coronavirus 2: NEGATIVE

## 2018-09-04 MED ORDER — SODIUM CHLORIDE 0.9 % IV BOLUS
1000.0000 mL | Freq: Once | INTRAVENOUS | Status: AC
  Administered 2018-09-04: 1000 mL via INTRAVENOUS

## 2018-09-04 MED ORDER — SODIUM CHLORIDE 0.9 % IV BOLUS
1000.0000 mL | Freq: Once | INTRAVENOUS | Status: AC
  Administered 2018-09-04: 01:00:00 1000 mL via INTRAVENOUS

## 2018-09-04 MED ORDER — AMOXICILLIN-POT CLAVULANATE 875-125 MG PO TABS: 1.0000 | ORAL_TABLET | Freq: Two times a day (BID) | ORAL | 0 refills | Status: DC

## 2018-09-04 MED ORDER — POTASSIUM CHLORIDE CRYS ER 20 MEQ PO TBCR
40.0000 meq | EXTENDED_RELEASE_TABLET | Freq: Once | ORAL | Status: AC
  Administered 2018-09-04: 40 meq via ORAL
  Filled 2018-09-04: qty 2

## 2018-09-04 NOTE — Discharge Instructions (Signed)
Drink plenty of fluids. Dr Delton Coombes wants you to monitor your temperature and if you get a fever over 100.5, start the Augmentin. If you don't get a fever, you don't need to fill the prescription.  Recheck if you get worse over the weekend.

## 2018-09-04 NOTE — ED Provider Notes (Signed)
[facility] EMERGENCY DEPARTMENT Provider Note   CSN: [id number] Arrival date & time: 09/03/18  2239  Time seen 11:15 PM  History   Chief Complaint Chief Complaint  Patient presents with  . Fever    HPI Devin Tran is a 53 y.o. male.     HPI patient has nodular lymphocyte predominant Hodgkin's lymphoma followed by Dr. Delton Coombes, he last had a chemotherapy treatment on July 21 with Cytoxan, Zinecard and Decadron.  He states he has had some fevers in the past after chemo but he would only have it for short period of time and on 1 day and it would be gone.  He said for the past couple days he has been having a low-grade fever of 99 and then about an hour prior to coming to the ED it was 99.6.  He denies any chills, cough, rhinorrhea, nausea, vomiting, diarrhea, urinary problems, rash, shortness of breath, chest pain, or abdominal pain.  He states he has a mild sore throat tonight.  He has a port but states it has only been in place about 5 months.  He states his girlfriend was sick earlier in the week and she has had a renal transplant but he does not know how she was sick.  PCP [name], MD   Past Medical History:  Diagnosis Date  . Complication of anesthesia   . Erectile dysfunction   . Hodgkin's lymphoma ([place])   . Hyperlipidemia   . Hypertension   . Infertility   . Low testosterone   . PONV (postoperative nausea and vomiting)     Patient Active Problem List   Diagnosis Date Noted  . 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 Lymphadenopathy 05/06/2018  . Hx of Hodgkins lymphoma 05/03/2018  . Hypogonadism male 05/20/2013  . Hypertension   . Erectile dysfunction   . Hyperlipidemia   . Infertility   . Nodular lymphocyte predom Hodgkin lymphoma lymph nodes multiple sites [facility])     Past Surgical History:  Procedure Laterality Date  . AXILLARY LYMPH NODE BIOPSY Right 05/10/2018   Procedure: AXILLARY LYMPH NODE BIOPSY, RIGHT;  Surgeon: Aviva Signs, MD;  Location: [facility];   Service: General;  Laterality: Right;  . IR LYMPHANGIOGRAM EXTREMITY UNI LEFT  03/02/1987  . PORTA CATH INSERTION    . PORTA CATH REMOVAL    . PORTACATH PLACEMENT Left 05/21/2018   Procedure: INSERTION PORT-A-CATH (attached catheter in left subclavian);  Surgeon: Aviva Signs, MD;  Location: [facility];  Service: General;  Laterality: Left;  . SPLENECTOMY Left Over 20 years ago   Due to Hodgkin's lymphoma        Home Medications    Prior to Admission medications   Medication Sig Start Date End Date Taking? Authorizing Provider  allopurinol (ZYLOPRIM) 300 MG tablet TAKE 1 TABLET BY MOUTH EVERY DAY 08/19/18   Lockamy, Randi L, NP-C  amLODipine (NORVASC) 10 MG tablet TAKE 1 TABLET BY MOUTH EVERY DAY Patient taking differently: Take 5 mg by mouth daily. TAKE 1 TABLET BY MOUTH EVERY DAY 05/25/18   [name], MD  amoxicillin-clavulanate (AUGMENTIN) 875-125 MG tablet Take 1 tablet by mouth 2 (two) times daily. 09/04/18   Rolland Porter, MD  Ascorbic Acid (VITAMIN C) 1000 MG tablet Take 1,000 mg by mouth daily. Vitamin c lozenge    [provider]  aspirin EC 81 MG tablet Take 81 mg by mouth daily.     [provider]  CYCLOPHOSPHAMIDE IV Inject into the vein every 21 ( twenty-one)  days. 06/01/18   [provider]  DOXOrubicin (ADRIAMYCIN) 2 MG/ML injection Inject into the vein every 21 ( twenty-one) days. 06/01/18   [provider]  glipiZIDE (GLUCOTROL XL) 2.5 MG 24 hr tablet TAKE 1 TABLET (2.5 MG TOTAL) BY MOUTH DAILY WITH BREAKFAST. 08/30/18   Derek Jack, MD  HYDROcodone-acetaminophen (NORCO) 5-325 MG tablet Take 1 tablet by mouth every 4 (four) hours as needed for moderate pain. 05/21/18   Aviva Signs, MD  lidocaine-prilocaine (EMLA) cream Apply pea-sized amount to port a cath site and cover with plastic wrap one hour prior to each chemotherapy appointment 05/27/18   Derek Jack, MD  Multiple Vitamins-Minerals (ONE-A-DAY MENS HEALTH FORMULA PO)  Take 1 tablet by mouth daily.     [provider]  ondansetron (ZOFRAN-ODT) 8 MG disintegrating tablet  07/17/18   [provider]  predniSONE (DELTASONE) 20 MG tablet Take 5 tablets (100 mg total) by mouth daily. Take on days 1-5 of chemotherapy. 05/27/18   Derek Jack, MD  prochlorperazine (COMPAZINE) 10 MG tablet Take 1 tablet (10 mg total) by mouth every 6 (six) hours as needed (Nausea or vomiting). 05/27/18   Derek Jack, MD  Propylene Glycol (SYSTANE BALANCE) 0.6 % SOLN Place 1 drop into both eyes 2 (two) times daily as needed (dry eyes).    [provider]  riTUXimab (RITUXAN IV) Inject into the vein every 21 ( twenty-one) days. 06/01/18   [provider]  vinCRIStine 2 mg in sodium chloride 0.9 % 50 mL Inject 2 mg into the vein every 21 ( twenty-one) days. 06/01/18   [provider]    Family History Family History  Problem Relation Age of Onset  . Hypertension Mother   . Cancer Father        lung  . Hypertension Father   . Diabetes Father   . COPD Father   . Hypertension Brother   . Stroke Brother     Social History Social History   Tobacco Use  . Smoking status: Never Smoker  . Smokeless tobacco: Never Used  Substance Use Topics  . Alcohol use: No  . Drug use: No     Allergies   Bleomycin   Review of Systems Review of Systems  All other systems reviewed and are negative.    Physical Exam Updated Vital Signs BP (!) 150/97 (BP Location: Right Arm)   Pulse (!) 132   Temp 98.5 F (36.9 C) (Oral)   Resp 18   Ht 6' (1.829 m)   Wt 95.3 kg   SpO2 99%   BMI 28.48 kg/m   Vital signs normal except for tachycardia   Physical Exam Vitals signs and nursing note reviewed.  Constitutional:      General: He is not in acute distress.    Appearance: Normal appearance. He is well-developed. He is not ill-appearing or toxic-appearing.  HENT:     Head: Normocephalic and atraumatic.     Right Ear: External  ear normal.     Left Ear: External ear normal.     Nose: Nose normal. No mucosal edema, congestion or rhinorrhea.     Mouth/Throat:     Mouth: Mucous membranes are moist.     Dentition: No dental abscesses.     Pharynx: Oropharynx is clear. No oropharyngeal exudate, posterior oropharyngeal erythema or uvula swelling.  Eyes:     Extraocular Movements: Extraocular movements intact.     Conjunctiva/sclera: Conjunctivae normal.     Pupils: Pupils are equal, round,  and reactive to light.  Neck:     Musculoskeletal: Full passive range of motion without pain, normal range of motion and neck supple.  Cardiovascular:     Rate and Rhythm: Normal rate and regular rhythm.     Heart sounds: Normal heart sounds. No murmur. No friction rub. No gallop.   Pulmonary:     Effort: Pulmonary effort is normal. No respiratory distress.     Breath sounds: Normal breath sounds. No wheezing, rhonchi or rales.  Chest:     Chest wall: No tenderness or crepitus.  Abdominal:     General: Bowel sounds are normal. There is no distension.     Palpations: Abdomen is soft.     Tenderness: There is no abdominal tenderness. There is no guarding or rebound.  Musculoskeletal: Normal range of motion.        General: No tenderness.     Comments: Moves all extremities well.   Skin:    General: Skin is warm and dry.     Coloration: Skin is not pale.     Findings: No erythema or rash.  Neurological:     General: No focal deficit present.     Mental Status: He is alert and oriented to person, place, and time.     Cranial Nerves: No cranial nerve deficit.  Psychiatric:        Mood and Affect: Mood normal. Mood is not anxious.        Speech: Speech normal.        Behavior: Behavior normal.        Thought Content: Thought content normal.      ED Treatments / Results  Labs (all labs ordered are listed, but only abnormal results are displayed) Results for orders placed or performed during the hospital encounter of  09/03/18  SARS Coronavirus 2 [facility] order, Performed in [facility] hospital lab)  Result Value Ref Range   SARS Coronavirus 2 NEGATIVE NEGATIVE  Comprehensive metabolic panel  Result Value Ref Range   Sodium 136 135 - 145 mmol/L   Potassium 3.1 (L) 3.5 - 5.1 mmol/L   Chloride 104 98 - 111 mmol/L   CO2 22 22 - 32 mmol/L   Glucose, Bld 256 (H) 70 - 99 mg/dL   BUN 12 6 - 20 mg/dL   Creatinine, Ser 1.04 0.61 - 1.24 mg/dL   Calcium 8.7 (L) 8.9 - 10.3 mg/dL   Total Protein 6.9 6.5 - 8.1 g/dL   Albumin 4.2 3.5 - 5.0 g/dL   AST 16 15 - 41 U/L   ALT 17 0 - 44 U/L   Alkaline Phosphatase 80 38 - 126 U/L   Total Bilirubin 0.3 0.3 - 1.2 mg/dL   GFR calc non Af Amer >60 >60 mL/min   GFR calc Af Amer >60 >60 mL/min   Anion gap 10 5 - 15  Lactic acid, plasma  Result Value Ref Range   Lactic Acid, Venous 2.7 (HH) 0.5 - 1.9 mmol/L  Lactic acid, plasma  Result Value Ref Range   Lactic Acid, Venous 1.6 0.5 - 1.9 mmol/L  CBC with Differential  Result Value Ref Range   WBC 6.4 4.0 - 10.5 K/uL   RBC 3.60 (L) 4.22 - 5.81 MIL/uL   Hemoglobin 10.2 (L) 13.0 - 17.0 g/dL   HCT 30.7 (L) 39.0 - 52.0 %   MCV 85.3 80.0 - 100.0 fL   MCH 28.3 26.0 - 34.0 pg   MCHC 33.2 30.0 - 36.0 g/dL  RDW 16.7 (H) 11.5 - 15.5 %   Platelets 146 (L) 150 - 400 K/uL   nRBC 1.3 (H) 0.0 - 0.2 %   Neutrophils Relative % 40 %   Neutro Abs 2.6 1.7 - 7.7 K/uL   Lymphocytes Relative 29 %   Lymphs Abs 1.8 0.7 - 4.0 K/uL   Monocytes Relative 22 %   Monocytes Absolute 1.4 (H) 0.1 - 1.0 K/uL   Eosinophils Relative 1 %   Eosinophils Absolute 0.0 0.0 - 0.5 K/uL   Basophils Relative 1 %   Basophils Absolute 0.1 0.0 - 0.1 K/uL   WBC Morphology INCREASED BANDS (>20% BANDS)    Immature Granulocytes 7 %   Abs Immature Granulocytes 0.43 (H) 0.00 - 0.07 K/uL  Urinalysis, Routine w reflex microscopic  Result Value Ref Range   Color, Urine YELLOW YELLOW   APPearance CLEAR CLEAR   Specific Gravity, Urine 1.020 1.005 - 1.030   pH  5.0 5.0 - 8.0   Glucose, UA >=500 (A) NEGATIVE mg/dL   Hgb urine dipstick NEGATIVE NEGATIVE   Bilirubin Urine NEGATIVE NEGATIVE   Ketones, ur NEGATIVE NEGATIVE mg/dL   Protein, ur NEGATIVE NEGATIVE mg/dL   Nitrite NEGATIVE NEGATIVE   Leukocytes,Ua NEGATIVE NEGATIVE   RBC / HPF 0-5 0 - 5 RBC/hpf   WBC, UA 0-5 0 - 5 WBC/hpf   Bacteria, UA NONE SEEN NONE SEEN   Mucus PRESENT    Laboratory interpretation all normal except positive lactic acidosis which improved with IV fluids, hyperglycemia, glucosuria, normal white blood cell count with left shift.    EKG None  Radiology Dg Chest Portable 1 View  Result Date: 09/04/2018 CLINICAL DATA:  Fever, currently undergoing chemotherapy for Hodgkin's lymphoma. EXAM: PORTABLE CHEST 1 VIEW COMPARISON:  05/21/2018 FINDINGS: Left Port-A-Cath in place. Heart and mediastinal contours are within normal limits. No focal opacities or effusions. No acute bony abnormality. IMPRESSION: No active disease. Electronically Signed   By: Rolm Baptise M.D.   On: 09/04/2018 00:35    Procedures Procedures (including critical care time)  Medications Ordered in ED Medications  sodium chloride 0.9 % bolus 1,000 mL (1,000 mLs Intravenous New Bag/Given 09/04/18 0053)  sodium chloride 0.9 % bolus 1,000 mL (1,000 mLs Intravenous New Bag/Given 09/04/18 0053)  potassium chloride SA (K-DUR) CR tablet 40 mEq (40 mEq Oral Given 09/04/18 0255)     Initial Impression / Assessment and Plan / ED Course  I have reviewed the triage vital signs and the nursing notes.  Pertinent labs & imaging results that were available during my care of the patient were reviewed by me and considered in my medical decision making (see chart for details).    Patient was given IV fluids.  Nursing states they were unable to get blood from his port and they removed the needle.  He had a peripheral IV inserted.  Blood cultures were obtained.  Blood work and urine with culture was obtained.  Portable chest  x-ray was negative.  COVID testing was negative.  Patient CBC shows normal white count with a left shift.  2:47 AM patient discussed with Dr. Delton Coombes, oncologist.  He states to give the patient a prescription for Augmentin 875 mg but to not fill it unless he gets a temperature over 100.5.  3:00 AM this information was relayed to the patient.  He is still getting his IV fluids.  Patient's temperature was rechecked in the ED and he did not have a fever.  4:00 AM patient has finished  his IV fluids and is ready to be discharged.  Devin Tran was evaluated in Emergency Department on 09/04/2018 for the symptoms described in the history of present illness. He was evaluated in the context of the global COVID-19 pandemic, which necessitated consideration that the patient might be at risk for infection with the SARS-CoV-2 virus that causes COVID-19. Institutional protocols and algorithms that pertain to the evaluation of patients at risk for COVID-19 are in a state of rapid change based on information released by regulatory bodies including the CDC and federal and state organizations. These policies and algorithms were followed during the patient's care in the ED.   Final Clinical Impressions(s) / ED Diagnoses   Final diagnoses:  Low grade fever    ED Discharge Orders         Ordered    amoxicillin-clavulanate (AUGMENTIN) 875-125 MG tablet  2 times daily     09/04/18 0308          Plan discharge  Rolland Porter, MD, Barbette Or, MD 09/04/18 [id number]

## 2018-09-04 NOTE — ED Notes (Signed)
Date and time results received: 09/04/18 0028 (use smartphrase ".now" to insert current time)  Test: Lactic Acid Critical Value: 2.7  Name of Provider Notified: Dr Tomi Bamberger  Orders Received? Or Actions Taken?:

## 2018-09-05 LAB — URINE CULTURE
Culture: NO GROWTH
Special Requests: NORMAL

## 2018-09-06 ENCOUNTER — Telehealth (HOSPITAL_COMMUNITY): Payer: Self-pay | Admitting: *Deleted

## 2018-09-06 NOTE — Telephone Encounter (Signed)
Pt called into clinic concerned about temperature. Pt went to ER for low grade fever. Antibiotics were prescribed but ER provider stated not to take antibiotics unless temperature is 100.5 or greater due to patient taking chemotherapy.ER provider told pt to drink plenty of water to stop low grade fever. Pt was taking tylenol 2x's daily for low grade fever. Pt wanted to know when will the low grade fevers stop and is it okay to still take Tylenol. I spoke with provider in the clinic and provider stated to stop taking Tylenol and see if fever reaches 100.5 or greater if so, call clinic. Pt also started he has a sore throat and provider stated for her to gargle with warm salt water as needed. Called pt to let him know what provider said patient verbalized understanding.

## 2018-09-09 LAB — CULTURE, BLOOD (SINGLE)
Culture: NO GROWTH
Culture: NO GROWTH
Special Requests: ADEQUATE
Special Requests: ADEQUATE

## 2018-09-13 ENCOUNTER — Other Ambulatory Visit (HOSPITAL_COMMUNITY): Payer: Self-pay | Admitting: *Deleted

## 2018-09-13 DIAGNOSIS — E119 Type 2 diabetes mellitus without complications: Secondary | ICD-10-CM

## 2018-09-13 MED ORDER — GLIPIZIDE ER 5 MG PO TB24: 5.0000 mg | ORAL_TABLET | Freq: Every day | ORAL | 1 refills | Status: DC

## 2018-09-13 MED ORDER — GLIPIZIDE ER 2.5 MG PO TB24: 2.5000 mg | ORAL_TABLET | Freq: Every day | ORAL | 0 refills | Status: DC

## 2018-09-14 ENCOUNTER — Encounter (HOSPITAL_COMMUNITY): Payer: Self-pay

## 2018-09-14 ENCOUNTER — Other Ambulatory Visit: Payer: Self-pay

## 2018-09-14 ENCOUNTER — Inpatient Hospital Stay (HOSPITAL_COMMUNITY): Payer: BC Managed Care – PPO

## 2018-09-14 ENCOUNTER — Encounter (HOSPITAL_COMMUNITY): Payer: Self-pay | Admitting: Hematology

## 2018-09-14 ENCOUNTER — Inpatient Hospital Stay (HOSPITAL_COMMUNITY): Payer: BC Managed Care – PPO | Attending: Hematology

## 2018-09-14 ENCOUNTER — Inpatient Hospital Stay (HOSPITAL_COMMUNITY): Payer: BC Managed Care – PPO | Attending: Hematology | Admitting: Hematology

## 2018-09-14 VITALS — BP 117/76 | HR 94 | Temp 96.9°F | Resp 18

## 2018-09-14 VITALS — BP 134/83 | HR 93 | Temp 97.9°F | Resp 18 | Wt 216.0 lb

## 2018-09-14 DIAGNOSIS — Z801 Family history of malignant neoplasm of trachea, bronchus and lung: Secondary | ICD-10-CM | POA: Insufficient documentation

## 2018-09-14 DIAGNOSIS — E119 Type 2 diabetes mellitus without complications: Secondary | ICD-10-CM | POA: Diagnosis not present

## 2018-09-14 DIAGNOSIS — Z5112 Encounter for antineoplastic immunotherapy: Secondary | ICD-10-CM | POA: Diagnosis not present

## 2018-09-14 DIAGNOSIS — C8108 Nodular lymphocyte predominant Hodgkin lymphoma, lymph nodes of multiple sites: Secondary | ICD-10-CM | POA: Diagnosis not present

## 2018-09-14 DIAGNOSIS — Z5189 Encounter for other specified aftercare: Secondary | ICD-10-CM | POA: Diagnosis not present

## 2018-09-14 DIAGNOSIS — Z5111 Encounter for antineoplastic chemotherapy: Secondary | ICD-10-CM | POA: Diagnosis not present

## 2018-09-14 DIAGNOSIS — Z79899 Other long term (current) drug therapy: Secondary | ICD-10-CM | POA: Insufficient documentation

## 2018-09-14 DIAGNOSIS — E785 Hyperlipidemia, unspecified: Secondary | ICD-10-CM | POA: Diagnosis not present

## 2018-09-14 DIAGNOSIS — I10 Essential (primary) hypertension: Secondary | ICD-10-CM | POA: Diagnosis not present

## 2018-09-14 LAB — COMPREHENSIVE METABOLIC PANEL
ALT: 20 U/L (ref 0–44)
AST: 16 U/L (ref 15–41)
Albumin: 4 g/dL (ref 3.5–5.0)
Alkaline Phosphatase: 58 U/L (ref 38–126)
Anion gap: 12 (ref 5–15)
BUN: 12 mg/dL (ref 6–20)
CO2: 22 mmol/L (ref 22–32)
Calcium: 9.2 mg/dL (ref 8.9–10.3)
Chloride: 106 mmol/L (ref 98–111)
Creatinine, Ser: 0.96 mg/dL (ref 0.61–1.24)
GFR calc Af Amer: >60 mL/min (ref >60)
GFR calc non Af Amer: >60 mL/min (ref >60)
Glucose, Bld: 227 mg/dL — ABNORMAL HIGH (ref 70–99)
Potassium: 3.8 mmol/L (ref 3.5–5.1)
Sodium: 140 mmol/L (ref 135–145)
Total Bilirubin: 0.4 mg/dL (ref 0.3–1.2)
Total Protein: 7.3 g/dL (ref 6.5–8.1)

## 2018-09-14 LAB — CBC WITH DIFFERENTIAL/PLATELET
Abs Immature Granulocytes: 0.33 10*3/uL — ABNORMAL HIGH (ref 0.00–0.07)
Basophils Absolute: 0.1 10*3/uL (ref 0.0–0.1)
Basophils Relative: 1 %
Eosinophils Absolute: 0.2 10*3/uL (ref 0.0–0.5)
Eosinophils Relative: 2 %
HCT: 32.2 % — ABNORMAL LOW (ref 39.0–52.0)
Hemoglobin: 10.2 g/dL — ABNORMAL LOW (ref 13.0–17.0)
Immature Granulocytes: 3 %
Lymphocytes Relative: 14 %
Lymphs Abs: 1.5 10*3/uL (ref 0.7–4.0)
MCH: 27.6 pg (ref 26.0–34.0)
MCHC: 31.7 g/dL (ref 30.0–36.0)
MCV: 87 fL (ref 80.0–100.0)
Monocytes Absolute: 1 10*3/uL (ref 0.1–1.0)
Monocytes Relative: 9 %
Neutro Abs: 7.8 10*3/uL — ABNORMAL HIGH (ref 1.7–7.7)
Neutrophils Relative %: 71 %
Platelets: 593 10*3/uL — ABNORMAL HIGH (ref 150–400)
RBC: 3.7 MIL/uL — ABNORMAL LOW (ref 4.22–5.81)
RDW: 17.1 % — ABNORMAL HIGH (ref 11.5–15.5)
WBC: 10.9 10*3/uL — ABNORMAL HIGH (ref 4.0–10.5)
nRBC: 0.4 % — ABNORMAL HIGH (ref 0.0–0.2)

## 2018-09-14 MED ORDER — LACTATED RINGERS IV SOLN
500.0000 mg/m2 | Freq: Once | INTRAVENOUS | Status: AC
  Administered 2018-09-14: 1180 mg via INTRAVENOUS
  Filled 2018-09-14: qty 118

## 2018-09-14 MED ORDER — ACETAMINOPHEN 325 MG PO TABS
650.0000 mg | ORAL_TABLET | Freq: Once | ORAL | Status: AC
  Administered 2018-09-14: 650 mg via ORAL
  Filled 2018-09-14: qty 2

## 2018-09-14 MED ORDER — DOXORUBICIN HCL CHEMO IV INJECTION 2 MG/ML
50.0000 mg/m2 | Freq: Once | INTRAVENOUS | Status: AC
  Administered 2018-09-14: 118 mg via INTRAVENOUS
  Filled 2018-09-14: qty 59

## 2018-09-14 MED ORDER — HEPARIN SOD (PORK) LOCK FLUSH 100 UNIT/ML IV SOLN
500.0000 [IU] | Freq: Once | INTRAVENOUS | Status: AC | PRN
  Administered 2018-09-14: 500 [IU]

## 2018-09-14 MED ORDER — PEGFILGRASTIM 6 MG/0.6ML ~~LOC~~ PSKT
6.0000 mg | PREFILLED_SYRINGE | Freq: Once | SUBCUTANEOUS | Status: AC
  Administered 2018-09-14: 6 mg via SUBCUTANEOUS
  Filled 2018-09-14: qty 0.6

## 2018-09-14 MED ORDER — PALONOSETRON HCL INJECTION 0.25 MG/5ML
0.2500 mg | Freq: Once | INTRAVENOUS | Status: AC
  Administered 2018-09-14: 10:00:00 0.25 mg via INTRAVENOUS
  Filled 2018-09-14: qty 5

## 2018-09-14 MED ORDER — SODIUM CHLORIDE 0.9% FLUSH
10.0000 mL | INTRAVENOUS | Status: DC | PRN
  Administered 2018-09-14: 10 mL
  Filled 2018-09-14: qty 10

## 2018-09-14 MED ORDER — SODIUM CHLORIDE 0.9 % IV SOLN
750.0000 mg/m2 | Freq: Once | INTRAVENOUS | Status: AC
  Administered 2018-09-14: 1760 mg via INTRAVENOUS
  Filled 2018-09-14: qty 88

## 2018-09-14 MED ORDER — SODIUM CHLORIDE 0.9 % IV SOLN
Freq: Once | INTRAVENOUS | Status: AC
  Administered 2018-09-14: 10:00:00 via INTRAVENOUS

## 2018-09-14 MED ORDER — SODIUM CHLORIDE 0.9 % IV SOLN
375.0000 mg/m2 | Freq: Once | INTRAVENOUS | Status: AC
  Administered 2018-09-14: 900 mg via INTRAVENOUS
  Filled 2018-09-14: qty 40

## 2018-09-14 MED ORDER — DIPHENHYDRAMINE HCL 25 MG PO CAPS
50.0000 mg | ORAL_CAPSULE | Freq: Once | ORAL | Status: AC
  Administered 2018-09-14: 50 mg via ORAL
  Filled 2018-09-14: qty 2

## 2018-09-14 MED ORDER — SODIUM CHLORIDE 0.9 % IV SOLN
10.0000 mg | Freq: Once | INTRAVENOUS | Status: AC
  Administered 2018-09-14: 10 mg via INTRAVENOUS
  Filled 2018-09-14: qty 10

## 2018-09-14 MED ORDER — FAMOTIDINE IN NACL 20-0.9 MG/50ML-% IV SOLN
20.0000 mg | Freq: Once | INTRAVENOUS | Status: AC
  Administered 2018-09-14: 20 mg via INTRAVENOUS
  Filled 2018-09-14: qty 50

## 2018-09-14 MED ORDER — VINCRISTINE SULFATE CHEMO INJECTION 1 MG/ML
2.0000 mg | Freq: Once | INTRAVENOUS | Status: AC
  Administered 2018-09-14: 2 mg via INTRAVENOUS
  Filled 2018-09-14: qty 2

## 2018-09-14 NOTE — Patient Instructions (Signed)
[facility] Discharge Instructions for Patients Receiving Chemotherapy  Today you received the following chemotherapy agents   To help prevent nausea and vomiting after your treatment, we encourage you to take your nausea medication   If you develop nausea and vomiting that is not controlled by your nausea medication, call the clinic.   BELOW ARE SYMPTOMS THAT SHOULD BE REPORTED IMMEDIATELY:  *FEVER GREATER THAN 100.5 F  *CHILLS WITH OR WITHOUT FEVER  NAUSEA AND VOMITING THAT IS NOT CONTROLLED WITH YOUR NAUSEA MEDICATION  *UNUSUAL SHORTNESS OF BREATH  *UNUSUAL BRUISING OR BLEEDING  TENDERNESS IN MOUTH AND THROAT WITH OR WITHOUT PRESENCE OF ULCERS  *URINARY PROBLEMS  *BOWEL PROBLEMS  UNUSUAL RASH Items with * indicate a potential emergency and should be followed up as soon as possible.  Feel free to call the clinic should you have any questions or concerns. The clinic phone number is [phone].  Please show the CHEMO ALERT CARD at check-in to the Emergency Department and triage nurse.   

## 2018-09-14 NOTE — Progress Notes (Signed)
Labs reviewed with MD, proceed today for possibly last treatment per MD. Will set up for hydration fluids on Friday.   Treatment given per orders. Patient tolerated it well without problems. Vitals stable and discharged home from clinic ambulatory. Follow up as scheduled.

## 2018-09-14 NOTE — Assessment & Plan Note (Signed)
  1.  NLP Hodgkin's lymphoma (recurrent 3B): -History of stage IV NLP Hodgkin's lymphoma with liver involvement diagnosed in 1989, treated with MOPP-ABVD.  He underwent splenectomy. -Recurrence in April 1992, treated with CCNU, etoposide and cisplatin for 6 cycles. - Presentation in March with right axillary mass, night sweats for 2 months and no fever or weight loss. -PET scan on 05/12/2018 showed hypermetabolic lymphadenopathy in the bilateral axillary, subpectoral region, right sub-clavicular region, and portacaval space. -Biopsy of the right axillary lymph node on 05/10/2018 showed B-cell lymphoma, phenotypically consistent with NLP HL. - 5 cycles of R-CHOP from 05/24/2018 through 08/24/2018. -Interim PET scan on 07/26/2018 shows significant reduction in size and FDG uptake associated with thoracic and upper abdominal adenopathy.  Residual of PET uptake in the thoracic lymph nodes is low level compatible with Deauville 2.  No evidence of residual or recurrent metabolic active tumor. - He went to the ER on 09/04/2018 with low-grade fevers.  His temperature was always in the 99 range.  He thinks it is the thermometer at home.  He denies any chills.  However he has minor night sweats every day. - We have reviewed his labs.  He will proceed with cycle 6 today.  I plan to repeat PET scan prior to next visit in 3 weeks.  2.  Cardioprotection: - He received Adriamycin 200 mg per metered square when he received chemotherapy in 1989.  We will plan to limit Adriamycin exposure to 50 mg per metered square. - We added dexrazoxane during cycle 4 at 500 mg/m2. -Echocardiogram on 08/18/2018 shows EF of 60 to 65%.  3.  Post splenectomy state: -Received Menactra and Pneumovax on 05/25/2018. -He received flu shot past season.  4.  Diabetes: - Hemoglobin A1c on 07/13/2018 was high at 8.6. - We have increased his glipizide XR to 5 mg daily on 08/24/2018.

## 2018-09-14 NOTE — Progress Notes (Signed)
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 ([place])   06/01/2018 -  Chemotherapy   The patient had DOXOrubicin (ADRIAMYCIN) chemo injection 118 mg, 50 mg/m2 = 118 mg, Intravenous,  Once, 6 of 6 cycles Administration: 118 mg (06/01/2018), 118 mg (06/22/2018), 118 mg (07/13/2018), 118 mg (08/03/2018), 118 mg (08/24/2018), 118 mg (09/14/2018) palonosetron (ALOXI) injection 0.25 mg, 0.25 mg, Intravenous,  Once, 6 of 6 cycles Administration: 0.25 mg (06/01/2018), 0.25 mg (06/22/2018), 0.25 mg (07/13/2018), 0.25 mg (08/03/2018), 0.25 mg (08/24/2018), 0.25 mg (09/14/2018) pegfilgrastim (NEULASTA ONPRO KIT) injection 6 mg, 6 mg, Subcutaneous, Once, 6 of 6 cycles Administration: 6 mg (06/01/2018), 6 mg (06/22/2018), 6 mg (07/13/2018), 6 mg (08/03/2018), 6 mg (08/24/2018), 6 mg (09/14/2018) vinCRIStine (ONCOVIN) 2 mg in sodium chloride 0.9 % 50 mL chemo infusion, 2 mg, Intravenous,  Once, 6 of 6 cycles Administration: 2 mg (06/01/2018), 2 mg (06/22/2018), 2 mg (07/13/2018), 2 mg (08/03/2018), 2 mg (08/24/2018), 2 mg (09/14/2018) riTUXimab (RITUXAN) 900 mg in sodium chloride 0.9 % 250 mL (2.6471 mg/mL) infusion, 375 mg/m2 = 900 mg, Intravenous,  Once, 1 of 1 cycle Administration: 900 mg (06/01/2018) cyclophosphamide (CYTOXAN) 1,760 mg in sodium chloride 0.9 % 250 mL chemo infusion, 750 mg/m2 = 1,760 mg, Intravenous,  Once, 6 of 6 cycles Administration: 1,760 mg (06/01/2018), 1,760 mg (06/22/2018), 1,760 mg (07/13/2018), 1,760 mg (08/03/2018), 1,760 mg (08/24/2018), 1,760 mg (09/14/2018) riTUXimab (RITUXAN) 900 mg in sodium  chloride 0.9 % 160 mL infusion, 375 mg/m2 = 900 mg (100 % of original dose 375 mg/m2), Intravenous,  Once, 5 of 5 cycles Dose modification: 375 mg/m2 (original dose 375 mg/m2, Cycle 2) Administration: 900 mg (06/22/2018), 900 mg (07/13/2018), 900 mg (08/03/2018), 900 mg (08/24/2018), 900 mg (09/14/2018) dexrazoxane (ZINECARD) 1,180 mg in lactated ringers 300 mL IVPB, 500 mg/m2 = 1,180 mg (100 % of original dose 500 mg/m2), Intravenous,  Once, 3 of 3 cycles Dose modification: 500 mg/m2 (original dose 500 mg/m2, Cycle 4), 500 mg/m2 (original dose 500 mg/m2, Cycle 4) Administration: 1,180 mg (08/24/2018), 1,180 mg (08/03/2018), 1,180 mg (09/14/2018)  for chemotherapy treatment.       CANCER STAGING: Cancer Staging No matching staging information was found for the patient.   INTERVAL HISTORY:  Devin Tran 53 y.o. male seen for follow-up of cycle 6 of chemotherapy.  Cycle 5 was on 08/24/2018.  He went to the ER on 09/04/2018 with fevers of 99 degrees.  Chest x-ray, cultures were all negative.  He denies any further fevers.  He has minor night sweats every night.  Appetite and energy levels are 75%.  Pain is reported as 0.  No tingling or numbness in the extremities reported.    REVIEW OF SYSTEMS:  Review of Systems  All other systems reviewed and are negative.    PAST MEDICAL/SURGICAL HISTORY:  Past Medical History:  Diagnosis Date  . Complication of anesthesia   . Erectile dysfunction   . Hodgkin's lymphoma ([place])   . Hyperlipidemia   . Hypertension   . Infertility   . Low testosterone   . PONV (postoperative nausea and vomiting)  Past Surgical History:  Procedure Laterality Date  . AXILLARY LYMPH NODE BIOPSY Right 05/10/2018   Procedure: AXILLARY LYMPH NODE BIOPSY, RIGHT;  Surgeon: Aviva Signs, MD;  Location: [facility];  Service: General;  Laterality: Right;  . IR LYMPHANGIOGRAM EXTREMITY UNI LEFT  03/02/1987  . PORTA CATH INSERTION    . PORTA CATH REMOVAL    . PORTACATH PLACEMENT Left  05/21/2018   Procedure: INSERTION PORT-A-CATH (attached catheter in left subclavian);  Surgeon: Aviva Signs, MD;  Location: [facility];  Service: General;  Laterality: Left;  . SPLENECTOMY Left Over 20 years ago   Due to Hodgkin's lymphoma     SOCIAL HISTORY:  Social History   Socioeconomic History  . Marital status: Single    Spouse name: Not on file  . Number of children: Not on file  . Years of education: Not on file  . Highest education level: Not on file  Occupational History  . Occupation: Drive Chief Operating Officer: [employer]  . Financial resource strain: Not on file  . Food insecurity    Worry: Not on file    Inability: Not on file  . Transportation needs    Medical: Not on file    Non-medical: Not on file  Tobacco Use  . Smoking status: Never Smoker  . Smokeless tobacco: Never Used  Substance and Sexual Activity  . Alcohol use: No  . Drug use: No  . Sexual activity: Yes  Lifestyle  . Physical activity    Days per week: Not on file    Minutes per session: Not on file  . Stress: Not on file  Relationships  . Social Herbalist on phone: Not on file    Gets together: Not on file    Attends religious service: Not on file    Active member of club or organization: Not on file    Attends meetings of clubs or organizations: Not on file    Relationship status: Not on file  . Intimate partner violence    Fear of current or ex partner: Not on file    Emotionally abused: Not on file    Physically abused: Not on file    Forced sexual activity: Not on file  Other Topics Concern  . Not on file  Social History Narrative  . Not on file    FAMILY HISTORY:  Family History  Problem Relation Age of Onset  . Hypertension Mother   . Cancer Father        lung  . Hypertension Father   . Diabetes Father   . COPD Father   . Hypertension Brother   . Stroke Brother     CURRENT MEDICATIONS:  Outpatient Encounter Medications as of  09/14/2018  Medication Sig  . allopurinol (ZYLOPRIM) 300 MG tablet TAKE 1 TABLET BY MOUTH EVERY DAY  . amLODipine (NORVASC) 10 MG tablet TAKE 1 TABLET BY MOUTH EVERY DAY (Patient taking differently: Take 5 mg by mouth daily. TAKE 1 TABLET BY MOUTH EVERY DAY)  . amoxicillin-clavulanate (AUGMENTIN) 875-125 MG tablet Take 1 tablet by mouth 2 (two) times daily.  . Ascorbic Acid (VITAMIN C) 1000 MG tablet Take 1,000 mg by mouth daily. Vitamin c lozenge  . aspirin EC 81 MG tablet Take 81 mg by mouth daily.   . CYCLOPHOSPHAMIDE IV Inject into the vein every 21 ( twenty-one) days.  Marland Kitchen DOXOrubicin (ADRIAMYCIN) 2 MG/ML injection Inject into the vein every 21 ( twenty-one)  days.  . glipiZIDE (GLUCOTROL XL) 5 MG 24 hr tablet Take 1 tablet (5 mg total) by mouth daily with breakfast.  . HYDROcodone-acetaminophen (NORCO) 5-325 MG tablet Take 1 tablet by mouth every 4 (four) hours as needed for moderate pain.  Marland Kitchen lidocaine-prilocaine (EMLA) cream Apply pea-sized amount to port a cath site and cover with plastic wrap one hour prior to each chemotherapy appointment  . Multiple Vitamins-Minerals (ONE-A-DAY MENS HEALTH FORMULA PO) Take 1 tablet by mouth daily.   . ondansetron (ZOFRAN-ODT) 8 MG disintegrating tablet   . predniSONE (DELTASONE) 20 MG tablet Take 5 tablets (100 mg total) by mouth daily. Take on days 1-5 of chemotherapy.  . prochlorperazine (COMPAZINE) 10 MG tablet Take 1 tablet (10 mg total) by mouth every 6 (six) hours as needed (Nausea or vomiting).  . Propylene Glycol (SYSTANE BALANCE) 0.6 % SOLN Place 1 drop into both eyes 2 (two) times daily as needed (dry eyes).  . riTUXimab (RITUXAN IV) Inject into the vein every 21 ( twenty-one) days.  . vinCRIStine 2 mg in sodium chloride 0.9 % 50 mL Inject 2 mg into the vein every 21 ( twenty-one) days.   No facility-administered encounter medications on file as of 09/14/2018.     ALLERGIES:  Allergies  Allergen Reactions  . Bleomycin Other (See Comments)     Cramping all over     PHYSICAL EXAM:  ECOG Performance status: 1  Vitals:   09/14/18 0805  BP: 134/83  Pulse: 93  Resp: 18  Temp: 97.9 F (36.6 C)  SpO2: 99%   Filed Weights   09/14/18 0805  Weight: 216 lb (98 kg)    Physical Exam Vitals signs reviewed.  Constitutional:      Appearance: Normal appearance.  Cardiovascular:     Rate and Rhythm: Normal rate and regular rhythm.     Heart sounds: Normal heart sounds.  Pulmonary:     Effort: Pulmonary effort is normal.     Breath sounds: Normal breath sounds.  Abdominal:     General: There is no distension.     Palpations: Abdomen is soft. There is no mass.  Musculoskeletal:        General: No swelling.  Lymphadenopathy:     Cervical: No cervical adenopathy.  Skin:    General: Skin is warm.  Neurological:     General: No focal deficit present.     Mental Status: He is alert and oriented to person, place, and time.  Psychiatric:        Mood and Affect: Mood normal.        Behavior: Behavior normal.      LABORATORY DATA:  I have reviewed the labs as listed.  CBC    Component Value Date/Time   WBC 10.9 (H) 09/14/2018 0805   RBC 3.70 (L) 09/14/2018 0805   HGB 10.2 (L) 09/14/2018 0805   HGB 16.1 05/03/2018 1553   HCT 32.2 (L) 09/14/2018 0805   HCT 49.8 05/03/2018 1553   PLT 593 (H) 09/14/2018 0805   PLT 280 05/03/2018 1553   MCV 87.0 09/14/2018 0805   MCV 82 05/03/2018 1553   MCH 27.6 09/14/2018 0805   MCHC 31.7 09/14/2018 0805   RDW 17.1 (H) 09/14/2018 0805   RDW 14.4 05/03/2018 1553   LYMPHSABS 1.5 09/14/2018 0805   LYMPHSABS 4.5 (H) 05/03/2018 1553   MONOABS 1.0 09/14/2018 0805   EOSABS 0.2 09/14/2018 0805   EOSABS 0.2 05/03/2018 1553   BASOSABS 0.1 09/14/2018 0805  BASOSABS 0.1 05/03/2018 1553   CMP Latest Ref Rng & Units 09/14/2018 09/03/2018 08/24/2018  Glucose 70 - 99 mg/dL 227(H) 256(H) 248(H)  BUN 6 - 20 mg/dL '12 12 14  ' Creatinine 0.61 - 1.24 mg/dL 0.96 1.04 0.92  Sodium 135 - 145 mmol/L  140 136 140  Potassium 3.5 - 5.1 mmol/L 3.8 3.1(L) 3.4(L)  Chloride 98 - 111 mmol/L 106 104 108  CO2 22 - 32 mmol/L '22 22 22  ' Calcium 8.9 - 10.3 mg/dL 9.2 8.7(L) 8.9  Total Protein 6.5 - 8.1 g/dL 7.3 6.9 6.6  Total Bilirubin 0.3 - 1.2 mg/dL 0.4 0.3 0.4  Alkaline Phos 38 - 126 U/L 58 80 63  AST 15 - 41 U/L '16 16 18  ' ALT 0 - 44 U/L '20 17 16       ' DIAGNOSTIC IMAGING:  I have independently reviewed the scans and discussed with the patient.   I have reviewed Venita Lick LPN's note and agree with the documentation.  I personally performed a face-to-face visit, made revisions and my assessment and plan is as follows.    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  NLP Hodgkin's lymphoma (recurrent 3B): -History of stage IV NLP Hodgkin's lymphoma with liver involvement diagnosed in 1989, treated with MOPP-ABVD.  He underwent splenectomy. -Recurrence in April 1992, treated with CCNU, etoposide and cisplatin for 6 cycles. - Presentation in March with right axillary mass, night sweats for 2 months and no fever or weight loss. -PET scan on 05/12/2018 showed hypermetabolic lymphadenopathy in the bilateral axillary, subpectoral region, right sub-clavicular region, and portacaval space. -Biopsy of the right axillary lymph node on 05/10/2018 showed B-cell lymphoma, phenotypically consistent with NLP HL. - 5 cycles of R-CHOP from 05/24/2018 through 08/24/2018. -Interim PET scan on 07/26/2018 shows significant reduction in size and FDG uptake associated with thoracic and upper abdominal adenopathy.  Residual of PET uptake in the thoracic lymph nodes is low level compatible with [place] 2.  No evidence of residual or recurrent metabolic active tumor. - He went to the ER on 09/04/2018 with low-grade fevers.  His temperature was always in the 99 range.  He thinks it is the thermometer at home.  He denies any chills.  However he has minor night sweats  every day. - We have reviewed his labs.  He will proceed with cycle 6 today.  I plan to repeat PET scan prior to next visit in 3 weeks.  2.  Cardioprotection: - He received Adriamycin 200 mg per metered square when he received chemotherapy in 1989.  We will plan to limit Adriamycin exposure to 50 mg per metered square. - We added dexrazoxane during cycle 4 at 500 mg/m2. -Echocardiogram on 08/18/2018 shows EF of 60 to 65%.  3.  Post splenectomy state: -Received Menactra and Pneumovax on 05/25/2018. -He received flu shot past season.  4.  Diabetes: - Hemoglobin A1c on 07/13/2018 was high at 8.6. - We have increased his glipizide XR to 5 mg daily on 08/24/2018.  Total time spent is 25 minutes percent of the time spent face-to-face discussing labs, treatment plan, counseling and coordination of care.    Orders placed this encounter:  Orders Placed This Encounter  Procedures  . NM PET Image Restag (PS) Skull Base To Thigh      Derek Jack, MD [place] [phone]

## 2018-09-14 NOTE — Patient Instructions (Addendum)
[facility] at [facility] Discharge Instructions  You were seen today by Dr. Katragadda. He went over your recent lab results. He will see you back in 3 weeks for labs and follow up.   Thank you for choosing [facility] at [facility] to provide your oncology and hematology care.  To afford each patient quality time with our provider, please arrive at least 15 minutes before your scheduled appointment time.   If you have a lab appointment with the Cancer Center please come in thru the  Main Entrance and check in at the main information desk  You need to re-schedule your appointment should you arrive 10 or more minutes late.  We strive to give you quality time with our providers, and arriving late affects you and other patients whose appointments are after yours.  Also, if you no show three or more times for appointments you may be dismissed from the clinic at the providers discretion.     Again, thank you for choosing [facility].  Our hope is that these requests will decrease the amount of time that you wait before being seen by our physicians.       _____________________________________________________________  Should you have questions after your visit to [facility], please contact our office at [phone] between the hours of 8:00 a.m. and 4:30 p.m.  Voicemails left after 4:00 p.m. will not be returned until the following business day.  For prescription refill requests, have your pharmacy contact our office and allow 72 hours.    Cancer Center Support Programs:   > Cancer Support Group  2nd Tuesday of the month 1pm-2pm, Journey Room    

## 2018-09-17 ENCOUNTER — Encounter (HOSPITAL_COMMUNITY): Payer: Self-pay

## 2018-09-17 ENCOUNTER — Other Ambulatory Visit: Payer: Self-pay

## 2018-09-17 ENCOUNTER — Inpatient Hospital Stay (HOSPITAL_COMMUNITY): Payer: BC Managed Care – PPO

## 2018-09-17 VITALS — BP 115/69 | HR 80 | Temp 97.1°F | Resp 16

## 2018-09-17 DIAGNOSIS — C8108 Nodular lymphocyte predominant Hodgkin lymphoma, lymph nodes of multiple sites: Secondary | ICD-10-CM

## 2018-09-17 DIAGNOSIS — Z5112 Encounter for antineoplastic immunotherapy: Secondary | ICD-10-CM | POA: Diagnosis not present

## 2018-09-17 DIAGNOSIS — Z79899 Other long term (current) drug therapy: Secondary | ICD-10-CM | POA: Diagnosis not present

## 2018-09-17 DIAGNOSIS — E785 Hyperlipidemia, unspecified: Secondary | ICD-10-CM | POA: Diagnosis not present

## 2018-09-17 DIAGNOSIS — Z8571 Personal history of Hodgkin lymphoma: Secondary | ICD-10-CM

## 2018-09-17 DIAGNOSIS — Z5111 Encounter for antineoplastic chemotherapy: Secondary | ICD-10-CM | POA: Diagnosis not present

## 2018-09-17 DIAGNOSIS — I10 Essential (primary) hypertension: Secondary | ICD-10-CM | POA: Diagnosis not present

## 2018-09-17 DIAGNOSIS — Z5189 Encounter for other specified aftercare: Secondary | ICD-10-CM | POA: Diagnosis not present

## 2018-09-17 DIAGNOSIS — E119 Type 2 diabetes mellitus without complications: Secondary | ICD-10-CM | POA: Diagnosis not present

## 2018-09-17 DIAGNOSIS — Z801 Family history of malignant neoplasm of trachea, bronchus and lung: Secondary | ICD-10-CM | POA: Diagnosis not present

## 2018-09-17 MED ORDER — SODIUM CHLORIDE 0.9 % IV SOLN
Freq: Once | INTRAVENOUS | Status: AC
  Administered 2018-09-17: 10:00:00 via INTRAVENOUS
  Filled 2018-09-17: qty 1000

## 2018-09-17 MED ORDER — HEPARIN SOD (PORK) LOCK FLUSH 100 UNIT/ML IV SOLN
500.0000 [IU] | Freq: Once | INTRAVENOUS | Status: AC | PRN
  Administered 2018-09-17: 500 [IU]

## 2018-09-17 MED ORDER — SODIUM CHLORIDE 0.9% FLUSH
10.0000 mL | Freq: Once | INTRAVENOUS | Status: AC | PRN
  Administered 2018-09-17: 10 mL

## 2018-09-17 NOTE — Progress Notes (Signed)
Patient here for hydration fluids. No new issues to report. Proceed as planned.

## 2018-09-18 ENCOUNTER — Other Ambulatory Visit (HOSPITAL_COMMUNITY): Payer: Self-pay | Admitting: Nurse Practitioner

## 2018-09-18 DIAGNOSIS — C8108 Nodular lymphocyte predominant Hodgkin lymphoma, lymph nodes of multiple sites: Secondary | ICD-10-CM

## 2018-09-28 ENCOUNTER — Other Ambulatory Visit (HOSPITAL_COMMUNITY): Payer: Self-pay | Admitting: *Deleted

## 2018-09-28 DIAGNOSIS — C8108 Nodular lymphocyte predominant Hodgkin lymphoma, lymph nodes of multiple sites: Secondary | ICD-10-CM

## 2018-09-28 NOTE — Progress Notes (Signed)
Patient called clinic reporting some swelling in his right forearm.  He states that it has been there for a few weeks.  It does not hurt and is not red at this time. He reports good range of motion and feeling in his arm.  He denies any recent injury.   I spoke to Francene Finders, NP and she wants an Ultrasound of the upper extremity.  Orders placed and patient is aware of appointment.

## 2018-10-01 ENCOUNTER — Ambulatory Visit (HOSPITAL_COMMUNITY)
Admission: RE | Admit: 2018-10-01 | Discharge: 2018-10-01 | Disposition: A | Discharge status: Home / Self Care | Payer: BC Managed Care – PPO | Source: Ambulatory Visit | Attending: Hematology | Admitting: Hematology

## 2018-10-01 ENCOUNTER — Other Ambulatory Visit: Payer: Self-pay

## 2018-10-01 DIAGNOSIS — R600 Localized edema: Secondary | ICD-10-CM | POA: Diagnosis not present

## 2018-10-01 DIAGNOSIS — C8108 Nodular lymphocyte predominant Hodgkin lymphoma, lymph nodes of multiple sites: Secondary | ICD-10-CM | POA: Insufficient documentation

## 2018-10-04 ENCOUNTER — Other Ambulatory Visit: Payer: Self-pay

## 2018-10-04 ENCOUNTER — Encounter (HOSPITAL_COMMUNITY)
Admission: RE | Admit: 2018-10-04 | Discharge: 2018-10-04 | Disposition: A | Discharge status: Home / Self Care | Payer: BC Managed Care – PPO | Source: Ambulatory Visit | Attending: Hematology | Admitting: Hematology

## 2018-10-04 DIAGNOSIS — C8108 Nodular lymphocyte predominant Hodgkin lymphoma, lymph nodes of multiple sites: Secondary | ICD-10-CM

## 2018-10-04 DIAGNOSIS — C8590 Non-Hodgkin lymphoma, unspecified, unspecified site: Secondary | ICD-10-CM | POA: Diagnosis not present

## 2018-10-04 MED ORDER — FLUDEOXYGLUCOSE F - 18 (FDG) INJECTION
15.5000 | Freq: Once | INTRAVENOUS | Status: AC | PRN
  Administered 2018-10-04: 16:00:00 15.5 via INTRAVENOUS

## 2018-10-05 ENCOUNTER — Other Ambulatory Visit (HOSPITAL_COMMUNITY): Payer: Self-pay | Admitting: Hematology

## 2018-10-05 DIAGNOSIS — E119 Type 2 diabetes mellitus without complications: Secondary | ICD-10-CM

## 2018-10-06 ENCOUNTER — Inpatient Hospital Stay (HOSPITAL_COMMUNITY): Payer: BC Managed Care – PPO | Attending: Hematology | Admitting: Hematology

## 2018-10-06 ENCOUNTER — Other Ambulatory Visit: Payer: Self-pay

## 2018-10-06 VITALS — BP 152/93 | HR 108 | Temp 97.9°F | Resp 18 | Wt 219.0 lb

## 2018-10-06 DIAGNOSIS — C8108 Nodular lymphocyte predominant Hodgkin lymphoma, lymph nodes of multiple sites: Secondary | ICD-10-CM | POA: Diagnosis not present

## 2018-10-06 DIAGNOSIS — E119 Type 2 diabetes mellitus without complications: Secondary | ICD-10-CM | POA: Insufficient documentation

## 2018-10-06 NOTE — Patient Instructions (Signed)
[place]  at [facility] Discharge Instructions  You were seen today by Dr. Delton Coombes. He went over your recent test results. He will refer you to Dr. Darreld Mclean in [place] for radiation. He will see you back in 3 months for labs and follow up.   Thank you for choosing [place] at [facility] to provide your oncology and hematology care.  To afford each patient quality time with our provider, please arrive at least 15 minutes before your scheduled appointment time.   If you have a lab appointment with the [facility] please come in thru the  Main Entrance and check in at the main information desk  You need to re-schedule your appointment should you arrive 10 or more minutes late.  We strive to give you quality time with our providers, and arriving late affects you and other patients whose appointments are after yours.  Also, if you no show three or more times for appointments you may be dismissed from the clinic at the providers discretion.     Again, thank you for choosing [facility].  Our hope is that these requests will decrease the amount of time that you wait before being seen by our physicians.       _____________________________________________________________  Should you have questions after your visit to [facility], please contact our office at [phone] between the hours of 8:00 a.m. and 4:30 p.m.  Voicemails left after 4:00 p.m. will not be returned until the following business day.  For prescription refill requests, have your pharmacy contact our office and allow 72 hours.    Cancer Center Support Programs:   > Cancer Support Group  2nd Tuesday of the month 1pm-2pm, Journey Room

## 2018-10-06 NOTE — Progress Notes (Signed)
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 ([place])   06/01/2018 -  Chemotherapy   The patient had DOXOrubicin (ADRIAMYCIN) chemo injection 118 mg, 50 mg/m2 = 118 mg, Intravenous,  Once, 6 of 6 cycles Administration: 118 mg (06/01/2018), 118 mg (06/22/2018), 118 mg (07/13/2018), 118 mg (08/03/2018), 118 mg (08/24/2018), 118 mg (09/14/2018) palonosetron (ALOXI) injection 0.25 mg, 0.25 mg, Intravenous,  Once, 6 of 6 cycles Administration: 0.25 mg (06/01/2018), 0.25 mg (06/22/2018), 0.25 mg (07/13/2018), 0.25 mg (08/03/2018), 0.25 mg (08/24/2018), 0.25 mg (09/14/2018) pegfilgrastim (NEULASTA ONPRO KIT) injection 6 mg, 6 mg, Subcutaneous, Once, 6 of 6 cycles Administration: 6 mg (06/01/2018), 6 mg (06/22/2018), 6 mg (07/13/2018), 6 mg (08/03/2018), 6 mg (08/24/2018), 6 mg (09/14/2018) vinCRIStine (ONCOVIN) 2 mg in sodium chloride 0.9 % 50 mL chemo infusion, 2 mg, Intravenous,  Once, 6 of 6 cycles Administration: 2 mg (06/01/2018), 2 mg (06/22/2018), 2 mg (07/13/2018), 2 mg (08/03/2018), 2 mg (08/24/2018), 2 mg (09/14/2018) riTUXimab (RITUXAN) 900 mg in sodium chloride 0.9 % 250 mL (2.6471 mg/mL) infusion, 375 mg/m2 = 900 mg, Intravenous,  Once, 1 of 1 cycle Administration: 900 mg (06/01/2018) cyclophosphamide (CYTOXAN) 1,760 mg in sodium chloride 0.9 % 250 mL chemo infusion, 750 mg/m2 = 1,760 mg, Intravenous,  Once, 6 of 6 cycles Administration: 1,760 mg (06/01/2018), 1,760 mg (06/22/2018), 1,760 mg (07/13/2018), 1,760 mg (08/03/2018), 1,760 mg (08/24/2018), 1,760 mg (09/14/2018) riTUXimab (RITUXAN) 900 mg in sodium  chloride 0.9 % 160 mL infusion, 375 mg/m2 = 900 mg (100 % of original dose 375 mg/m2), Intravenous,  Once, 5 of 5 cycles Dose modification: 375 mg/m2 (original dose 375 mg/m2, Cycle 2) Administration: 900 mg (06/22/2018), 900 mg (07/13/2018), 900 mg (08/03/2018), 900 mg (08/24/2018), 900 mg (09/14/2018) dexrazoxane (ZINECARD) 1,180 mg in lactated ringers 300 mL IVPB, 500 mg/m2 = 1,180 mg (100 % of original dose 500 mg/m2), Intravenous,  Once, 3 of 3 cycles Dose modification: 500 mg/m2 (original dose 500 mg/m2, Cycle 4), 500 mg/m2 (original dose 500 mg/m2, Cycle 4) Administration: 1,180 mg (08/24/2018), 1,180 mg (08/03/2018), 1,180 mg (09/14/2018)  for chemotherapy treatment.       CANCER STAGING: Cancer Staging No matching staging information was found for the patient.   INTERVAL HISTORY:  Mr. Ressel 53 y.o. male seen for follow-up of his lymphoma.  Last cycle of chemotherapy was on 09/14/2018.  Appetite is 50%.  Energy levels are 75%.  Denied any nausea, vomiting, diarrhea or constipation.  No signs or symptoms of PND/ orthopnea.  Denies any tingling or numbness in the extremities.  No fevers or chills.  Has occasional night sweats.   REVIEW OF SYSTEMS:  Review of Systems  All other systems reviewed and are negative.    PAST MEDICAL/SURGICAL HISTORY:  Past Medical History:  Diagnosis Date  . Complication of anesthesia   . Erectile dysfunction   . Hodgkin's lymphoma ([place])   . Hyperlipidemia   . Hypertension   . Infertility   . Low testosterone   . PONV (postoperative nausea and vomiting)    Past Surgical History:  Procedure Laterality Date  . AXILLARY LYMPH  NODE BIOPSY Right 05/10/2018   Procedure: AXILLARY LYMPH NODE BIOPSY, RIGHT;  Surgeon: Aviva Signs, MD;  Location: [facility];  Service: General;  Laterality: Right;  . IR LYMPHANGIOGRAM EXTREMITY UNI LEFT  03/02/1987  . PORTA CATH INSERTION    . PORTA CATH REMOVAL    . PORTACATH PLACEMENT Left 05/21/2018   Procedure: INSERTION  PORT-A-CATH (attached catheter in left subclavian);  Surgeon: Aviva Signs, MD;  Location: [facility];  Service: General;  Laterality: Left;  . SPLENECTOMY Left Over 20 years ago   Due to Hodgkin's lymphoma     SOCIAL HISTORY:  Social History   Socioeconomic History  . Marital status: Single    Spouse name: Not on file  . Number of children: Not on file  . Years of education: Not on file  . Highest education level: Not on file  Occupational History  . Occupation: Drive Chief Operating Officer: [employer]  . Financial resource strain: Not on file  . Food insecurity    Worry: Not on file    Inability: Not on file  . Transportation needs    Medical: Not on file    Non-medical: Not on file  Tobacco Use  . Smoking status: Never Smoker  . Smokeless tobacco: Never Used  Substance and Sexual Activity  . Alcohol use: No  . Drug use: No  . Sexual activity: Yes  Lifestyle  . Physical activity    Days per week: Not on file    Minutes per session: Not on file  . Stress: Not on file  Relationships  . Social Herbalist on phone: Not on file    Gets together: Not on file    Attends religious service: Not on file    Active member of club or organization: Not on file    Attends meetings of clubs or organizations: Not on file    Relationship status: Not on file  . Intimate partner violence    Fear of current or ex partner: Not on file    Emotionally abused: Not on file    Physically abused: Not on file    Forced sexual activity: Not on file  Other Topics Concern  . Not on file  Social History Narrative  . Not on file    FAMILY HISTORY:  Family History  Problem Relation Age of Onset  . Hypertension Mother   . Cancer Father        lung  . Hypertension Father   . Diabetes Father   . COPD Father   . Hypertension Brother   . Stroke Brother     CURRENT MEDICATIONS:  Outpatient Encounter Medications as of 10/06/2018  Medication Sig  .  allopurinol (ZYLOPRIM) 300 MG tablet TAKE 1 TABLET BY MOUTH EVERY DAY  . amLODipine (NORVASC) 10 MG tablet TAKE 1 TABLET BY MOUTH EVERY DAY (Patient taking differently: Take 10 mg by mouth daily. TAKE 1 TABLET BY MOUTH EVERY DAY)  . aspirin EC 81 MG tablet Take 81 mg by mouth daily.   Marland Kitchen glipiZIDE (GLUCOTROL XL) 5 MG 24 hr tablet TAKE 1 TABLET BY MOUTH EVERY DAY WITH BREAKFAST  . Propylene Glycol (SYSTANE BALANCE) 0.6 % SOLN Place 1 drop into both eyes 2 (two) times daily as needed (dry eyes).  . Ascorbic Acid (VITAMIN C) 1000 MG tablet Take 1,000 mg by mouth daily. Vitamin c lozenge  . CYCLOPHOSPHAMIDE IV Inject into the vein every 21 ( twenty-one) days.  Marland Kitchen  DOXOrubicin (ADRIAMYCIN) 2 MG/ML injection Inject into the vein every 21 ( twenty-one) days.  Marland Kitchen lidocaine-prilocaine (EMLA) cream Apply pea-sized amount to port a cath site and cover with plastic wrap one hour prior to each chemotherapy appointment (Patient not taking: Reported on 10/06/2018)  . Multiple Vitamins-Minerals (ONE-A-DAY MENS HEALTH FORMULA PO) Take 1 tablet by mouth daily.   . ondansetron (ZOFRAN-ODT) 8 MG disintegrating tablet Take 8 mg by mouth as needed.   . prochlorperazine (COMPAZINE) 10 MG tablet Take 1 tablet (10 mg total) by mouth every 6 (six) hours as needed (Nausea or vomiting). (Patient not taking: Reported on 10/06/2018)  . riTUXimab (RITUXAN IV) Inject into the vein every 21 ( twenty-one) days.  . vinCRIStine 2 mg in sodium chloride 0.9 % 50 mL Inject 2 mg into the vein every 21 ( twenty-one) days.  . [DISCONTINUED] amoxicillin-clavulanate (AUGMENTIN) 875-125 MG tablet Take 1 tablet by mouth 2 (two) times daily.  . [DISCONTINUED] HYDROcodone-acetaminophen (NORCO) 5-325 MG tablet Take 1 tablet by mouth every 4 (four) hours as needed for moderate pain.  . [DISCONTINUED] predniSONE (DELTASONE) 20 MG tablet Take 5 tablets (100 mg total) by mouth daily. Take on days 1-5 of chemotherapy. (Patient not taking: Reported on 10/06/2018)    No facility-administered encounter medications on file as of 10/06/2018.     ALLERGIES:  Allergies  Allergen Reactions  . Bleomycin Other (See Comments)    Cramping all over     PHYSICAL EXAM:  ECOG Performance status: 1  Vitals:   10/06/18 1113  BP: (!) 152/93  Pulse: (!) 108  Resp: 18  Temp: 97.9 F (36.6 C)  SpO2: 96%   Filed Weights   10/06/18 1113  Weight: 219 lb (99.3 kg)    Physical Exam Vitals signs reviewed.  Constitutional:      Appearance: Normal appearance.  Cardiovascular:     Rate and Rhythm: Normal rate and regular rhythm.     Heart sounds: Normal heart sounds.  Pulmonary:     Effort: Pulmonary effort is normal.     Breath sounds: Normal breath sounds.  Abdominal:     General: There is no distension.     Palpations: Abdomen is soft. There is no mass.  Musculoskeletal:        General: No swelling.  Lymphadenopathy:     Cervical: No cervical adenopathy.  Skin:    General: Skin is warm.  Neurological:     General: No focal deficit present.     Mental Status: He is alert and oriented to person, place, and time.  Psychiatric:        Mood and Affect: Mood normal.        Behavior: Behavior normal.   Very small right axillary lymph node palpable.   LABORATORY DATA:  I have reviewed the labs as listed.  CBC    Component Value Date/Time   WBC 10.9 (H) 09/14/2018 0805   RBC 3.70 (L) 09/14/2018 0805   HGB 10.2 (L) 09/14/2018 0805   HGB 16.1 05/03/2018 1553   HCT 32.2 (L) 09/14/2018 0805   HCT 49.8 05/03/2018 1553   PLT 593 (H) 09/14/2018 0805   PLT 280 05/03/2018 1553   MCV 87.0 09/14/2018 0805   MCV 82 05/03/2018 1553   MCH 27.6 09/14/2018 0805   MCHC 31.7 09/14/2018 0805   RDW 17.1 (H) 09/14/2018 0805   RDW 14.4 05/03/2018 1553   LYMPHSABS 1.5 09/14/2018 0805   LYMPHSABS 4.5 (H) 05/03/2018 1553   MONOABS 1.0 09/14/2018  0805   EOSABS 0.2 09/14/2018 0805   EOSABS 0.2 05/03/2018 1553   BASOSABS 0.1 09/14/2018 0805   BASOSABS 0.1  05/03/2018 1553   CMP Latest Ref Rng & Units 09/14/2018 09/03/2018 08/24/2018  Glucose 70 - 99 mg/dL 227(H) 256(H) 248(H)  BUN 6 - 20 mg/dL _0 Creatinine 0.61 - 1.24 mg/dL 0.96 1.04 0.92  Sodium 135 - 145 mmol/L 140 136 140  Potassium 3.5 - 5.1 mmol/L 3.8 3.1(L) 3.4(L)  Chloride 98 - 111 mmol/L 106 104 108  CO2 22 - 32 mmol/L _1 Calcium 8.9 - 10.3 mg/dL 9.2 8.7(L) 8.9  Total Protein 6.5 - 8.1 g/dL 7.3 6.9 6.6  Total Bilirubin 0.3 - 1.2 mg/dL 0.4 0.3 0.4  Alkaline Phos 38 - 126 U/L 58 80 63  AST 15 - 41 U/L _2 ALT 0 - 44 U/L _3 DIAGNOSTIC IMAGING:  I have independently reviewed the scans and discussed with the patient.   I have reviewed Venita Lick LPN's note and agree with the documentation.  I personally performed a face-to-face visit, made revisions and my assessment and plan is as follows.    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  NLP Hodgkin's lymphoma (recurrent 3B): -History of stage IV NLP Hodgkin's lymphoma with liver involvement diagnosed in 1989, treated with MOPP-ABVD.  He underwent splenectomy. -Recurrence in April 1992, treated with CCNU, etoposide and cisplatin for 6 cycles. - Presentation in March with right axillary mass, night sweats for 2 months and no fever or weight loss. -PET scan on 05/12/2018 showed hypermetabolic lymphadenopathy in the bilateral axillary, subpectoral region, right sub-clavicular region, and portacaval space. -Biopsy of the right axillary lymph node on 05/10/2018 showed B-cell lymphoma, phenotypically consistent with NLP HL. - 6 cycles of R-CHOP from 05/24/2018 through 09/14/2018. -Interim PET scan on 07/26/2018 showed significant reduction in size and FDG uptake associated with thoracic and upper abdominal adenopathy.  Residual PET uptake in the thoracic lymph nodes is low level compatible with Deauville 2.   - He did not have any major side effects  after cycle 6. - We reviewed PET scan from 10/04/2018 which showed right axillary lymph node measuring 1.8 cm with SUV of 1.92.  There are some right pectoral lymph nodes with SUV 1.5.  Overall this represents Deauville 2 which is a very good response. - I talked to him about consolidative radiation therapy to the right axilla and pectoral regions.  We will make a referral to [facility] and get his opinion. - He will continue port flush every 12 weeks.  I will see him back in 3 months for follow-up.  2.  Cardioprotection: - He received Adriamycin 200 mg per metered square when he received chemotherapy in 1989. - We added dexrazoxane for cardioprotection from cycle 4 onwards. -Last echo on 08/18/2018 showed EF of 60-65%.  3.  Post splenectomy state: -Received Menactra and Pneumovax on 05/25/2018. -He received flu shot past season.  4.  Diabetes: - Hemoglobin A1c on 07/13/2018 was high at 8.6. - We have increased his glipizide XR to 5 mg daily on 08/24/2018.  Total time spent is 25 minutes percent of the time spent face-to-face discussing labs, treatment plan, counseling and coordination of care.    Orders placed this encounter:  Orders Placed This Encounter  Procedures  . CBC with Differential/Platelet  . Comprehensive metabolic panel  .  Lactate dehydrogenase      Derek Jack, MD [place] [phone]

## 2018-10-08 ENCOUNTER — Telehealth (HOSPITAL_COMMUNITY): Payer: Self-pay

## 2018-10-08 NOTE — Telephone Encounter (Signed)
Patient was advised to stop Allopurinol and continue Glipizide per Reynolds Bowl, FNP.

## 2018-10-09 ENCOUNTER — Encounter (HOSPITAL_COMMUNITY): Payer: Self-pay | Admitting: Hematology

## 2018-10-09 NOTE — Assessment & Plan Note (Signed)
  1.  NLP Hodgkin's lymphoma (recurrent 3B): -History of stage IV NLP Hodgkin's lymphoma with liver involvement diagnosed in 1989, treated with MOPP-ABVD.  He underwent splenectomy. -Recurrence in April 1992, treated with CCNU, etoposide and cisplatin for 6 cycles. - Presentation in March with right axillary mass, night sweats for 2 months and no fever or weight loss. -PET scan on 05/12/2018 showed hypermetabolic lymphadenopathy in the bilateral axillary, subpectoral region, right sub-clavicular region, and portacaval space. -Biopsy of the right axillary lymph node on 05/10/2018 showed B-cell lymphoma, phenotypically consistent with NLP HL. - 6 cycles of R-CHOP from 05/24/2018 through 09/14/2018. -Interim PET scan on 07/26/2018 showed significant reduction in size and FDG uptake associated with thoracic and upper abdominal adenopathy.  Residual PET uptake in the thoracic lymph nodes is low level compatible with Deauville 2.   - He did not have any major side effects after cycle 6. - We reviewed PET scan from 10/04/2018 which showed right axillary lymph node measuring 1.8 cm with SUV of 1.92.  There are some right pectoral lymph nodes with SUV 1.5.  Overall this represents Deauville 2 which is a very good response. - I talked to him about consolidative radiation therapy to the right axilla and pectoral regions.  We will make a referral to [place] and get his opinion. - He will continue port flush every 12 weeks.  I will see him back in 3 months for follow-up.  2.  Cardioprotection: - He received Adriamycin 200 mg per metered square when he received chemotherapy in 1989. - We added dexrazoxane for cardioprotection from cycle 4 onwards. -Last echo on 08/18/2018 showed EF of 60-65%.  3.  Post splenectomy state: -Received Menactra and Pneumovax on 05/25/2018. -He received flu shot past season.  4.  Diabetes: - Hemoglobin A1c on 07/13/2018 was high at 8.6. - We have increased his glipizide XR to 5 mg  daily on 08/24/2018.

## 2018-10-13 ENCOUNTER — Other Ambulatory Visit (HOSPITAL_COMMUNITY): Payer: Self-pay | Admitting: Nurse Practitioner

## 2018-10-13 DIAGNOSIS — C8108 Nodular lymphocyte predominant Hodgkin lymphoma, lymph nodes of multiple sites: Secondary | ICD-10-CM

## 2018-10-14 DIAGNOSIS — C8104 Nodular lymphocyte predominant Hodgkin lymphoma, lymph nodes of axilla and upper limb: Secondary | ICD-10-CM | POA: Diagnosis not present

## 2018-10-14 DIAGNOSIS — Z510 Encounter for antineoplastic radiation therapy: Secondary | ICD-10-CM | POA: Diagnosis not present

## 2018-10-14 DIAGNOSIS — Z6829 Body mass index (BMI) 29.0-29.9, adult: Secondary | ICD-10-CM | POA: Diagnosis not present

## 2018-10-14 DIAGNOSIS — C8114 Nodular sclerosis classical Hodgkin lymphoma, lymph nodes of axilla and upper limb: Secondary | ICD-10-CM | POA: Diagnosis not present

## 2018-10-16 ENCOUNTER — Other Ambulatory Visit (HOSPITAL_COMMUNITY): Payer: Self-pay | Admitting: Nurse Practitioner

## 2018-10-16 DIAGNOSIS — C8108 Nodular lymphocyte predominant Hodgkin lymphoma, lymph nodes of multiple sites: Secondary | ICD-10-CM

## 2018-10-29 ENCOUNTER — Other Ambulatory Visit (HOSPITAL_COMMUNITY): Payer: Self-pay | Admitting: Hematology

## 2018-10-29 DIAGNOSIS — E119 Type 2 diabetes mellitus without complications: Secondary | ICD-10-CM

## 2018-11-01 ENCOUNTER — Encounter (HOSPITAL_COMMUNITY): Payer: Self-pay | Admitting: *Deleted

## 2018-11-01 DIAGNOSIS — Z510 Encounter for antineoplastic radiation therapy: Secondary | ICD-10-CM | POA: Diagnosis not present

## 2018-11-01 DIAGNOSIS — C8104 Nodular lymphocyte predominant Hodgkin lymphoma, lymph nodes of axilla and upper limb: Secondary | ICD-10-CM | POA: Diagnosis not present

## 2018-11-03 DIAGNOSIS — C8104 Nodular lymphocyte predominant Hodgkin lymphoma, lymph nodes of axilla and upper limb: Secondary | ICD-10-CM | POA: Diagnosis not present

## 2018-11-03 DIAGNOSIS — Z510 Encounter for antineoplastic radiation therapy: Secondary | ICD-10-CM | POA: Diagnosis not present

## 2018-11-05 DIAGNOSIS — C8104 Nodular lymphocyte predominant Hodgkin lymphoma, lymph nodes of axilla and upper limb: Secondary | ICD-10-CM | POA: Diagnosis not present

## 2018-11-05 DIAGNOSIS — Z510 Encounter for antineoplastic radiation therapy: Secondary | ICD-10-CM | POA: Diagnosis not present

## 2018-11-11 DIAGNOSIS — C8104 Nodular lymphocyte predominant Hodgkin lymphoma, lymph nodes of axilla and upper limb: Secondary | ICD-10-CM | POA: Diagnosis not present

## 2018-11-11 DIAGNOSIS — Z510 Encounter for antineoplastic radiation therapy: Secondary | ICD-10-CM | POA: Diagnosis not present

## 2018-11-12 DIAGNOSIS — C8104 Nodular lymphocyte predominant Hodgkin lymphoma, lymph nodes of axilla and upper limb: Secondary | ICD-10-CM | POA: Diagnosis not present

## 2018-11-12 DIAGNOSIS — Z510 Encounter for antineoplastic radiation therapy: Secondary | ICD-10-CM | POA: Diagnosis not present

## 2018-11-14 ENCOUNTER — Other Ambulatory Visit: Payer: Self-pay | Admitting: Family Medicine

## 2018-11-15 DIAGNOSIS — Z510 Encounter for antineoplastic radiation therapy: Secondary | ICD-10-CM | POA: Diagnosis not present

## 2018-11-15 DIAGNOSIS — C8104 Nodular lymphocyte predominant Hodgkin lymphoma, lymph nodes of axilla and upper limb: Secondary | ICD-10-CM | POA: Diagnosis not present

## 2018-11-16 DIAGNOSIS — Z510 Encounter for antineoplastic radiation therapy: Secondary | ICD-10-CM | POA: Diagnosis not present

## 2018-11-16 DIAGNOSIS — C8104 Nodular lymphocyte predominant Hodgkin lymphoma, lymph nodes of axilla and upper limb: Secondary | ICD-10-CM | POA: Diagnosis not present

## 2018-11-17 DIAGNOSIS — C8104 Nodular lymphocyte predominant Hodgkin lymphoma, lymph nodes of axilla and upper limb: Secondary | ICD-10-CM | POA: Diagnosis not present

## 2018-11-17 DIAGNOSIS — Z510 Encounter for antineoplastic radiation therapy: Secondary | ICD-10-CM | POA: Diagnosis not present

## 2018-11-18 DIAGNOSIS — Z510 Encounter for antineoplastic radiation therapy: Secondary | ICD-10-CM | POA: Diagnosis not present

## 2018-11-18 DIAGNOSIS — C8104 Nodular lymphocyte predominant Hodgkin lymphoma, lymph nodes of axilla and upper limb: Secondary | ICD-10-CM | POA: Diagnosis not present

## 2018-11-19 DIAGNOSIS — C8104 Nodular lymphocyte predominant Hodgkin lymphoma, lymph nodes of axilla and upper limb: Secondary | ICD-10-CM | POA: Diagnosis not present

## 2018-11-19 DIAGNOSIS — Z510 Encounter for antineoplastic radiation therapy: Secondary | ICD-10-CM | POA: Diagnosis not present

## 2018-11-22 DIAGNOSIS — C8104 Nodular lymphocyte predominant Hodgkin lymphoma, lymph nodes of axilla and upper limb: Secondary | ICD-10-CM | POA: Diagnosis not present

## 2018-11-22 DIAGNOSIS — Z510 Encounter for antineoplastic radiation therapy: Secondary | ICD-10-CM | POA: Diagnosis not present

## 2018-11-23 DIAGNOSIS — Z510 Encounter for antineoplastic radiation therapy: Secondary | ICD-10-CM | POA: Diagnosis not present

## 2018-11-23 DIAGNOSIS — C8104 Nodular lymphocyte predominant Hodgkin lymphoma, lymph nodes of axilla and upper limb: Secondary | ICD-10-CM | POA: Diagnosis not present

## 2018-11-24 DIAGNOSIS — C8104 Nodular lymphocyte predominant Hodgkin lymphoma, lymph nodes of axilla and upper limb: Secondary | ICD-10-CM | POA: Diagnosis not present

## 2018-11-24 DIAGNOSIS — Z510 Encounter for antineoplastic radiation therapy: Secondary | ICD-10-CM | POA: Diagnosis not present

## 2018-11-25 ENCOUNTER — Other Ambulatory Visit (HOSPITAL_COMMUNITY): Payer: Self-pay | Admitting: Hematology

## 2018-11-25 DIAGNOSIS — E119 Type 2 diabetes mellitus without complications: Secondary | ICD-10-CM

## 2018-11-25 DIAGNOSIS — Z510 Encounter for antineoplastic radiation therapy: Secondary | ICD-10-CM | POA: Diagnosis not present

## 2018-11-25 DIAGNOSIS — C8104 Nodular lymphocyte predominant Hodgkin lymphoma, lymph nodes of axilla and upper limb: Secondary | ICD-10-CM | POA: Diagnosis not present

## 2018-11-26 DIAGNOSIS — Z510 Encounter for antineoplastic radiation therapy: Secondary | ICD-10-CM | POA: Diagnosis not present

## 2018-11-26 DIAGNOSIS — C8104 Nodular lymphocyte predominant Hodgkin lymphoma, lymph nodes of axilla and upper limb: Secondary | ICD-10-CM | POA: Diagnosis not present

## 2018-11-29 DIAGNOSIS — Z510 Encounter for antineoplastic radiation therapy: Secondary | ICD-10-CM | POA: Diagnosis not present

## 2018-11-29 DIAGNOSIS — C8104 Nodular lymphocyte predominant Hodgkin lymphoma, lymph nodes of axilla and upper limb: Secondary | ICD-10-CM | POA: Diagnosis not present

## 2018-11-30 DIAGNOSIS — Z510 Encounter for antineoplastic radiation therapy: Secondary | ICD-10-CM | POA: Diagnosis not present

## 2018-11-30 DIAGNOSIS — C8104 Nodular lymphocyte predominant Hodgkin lymphoma, lymph nodes of axilla and upper limb: Secondary | ICD-10-CM | POA: Diagnosis not present

## 2018-12-01 DIAGNOSIS — C8104 Nodular lymphocyte predominant Hodgkin lymphoma, lymph nodes of axilla and upper limb: Secondary | ICD-10-CM | POA: Diagnosis not present

## 2018-12-01 DIAGNOSIS — Z510 Encounter for antineoplastic radiation therapy: Secondary | ICD-10-CM | POA: Diagnosis not present

## 2018-12-06 ENCOUNTER — Ambulatory Visit: Payer: BLUE CROSS/BLUE SHIELD | Admitting: Family Medicine

## 2018-12-27 ENCOUNTER — Inpatient Hospital Stay (HOSPITAL_COMMUNITY): Payer: BC Managed Care – PPO

## 2018-12-28 ENCOUNTER — Other Ambulatory Visit: Payer: Self-pay

## 2018-12-28 ENCOUNTER — Inpatient Hospital Stay (HOSPITAL_COMMUNITY): Payer: BC Managed Care – PPO | Attending: Hematology

## 2018-12-28 ENCOUNTER — Encounter (HOSPITAL_COMMUNITY): Payer: Self-pay

## 2018-12-28 DIAGNOSIS — Z452 Encounter for adjustment and management of vascular access device: Secondary | ICD-10-CM | POA: Diagnosis not present

## 2018-12-28 DIAGNOSIS — C8108 Nodular lymphocyte predominant Hodgkin lymphoma, lymph nodes of multiple sites: Secondary | ICD-10-CM

## 2018-12-28 LAB — COMPREHENSIVE METABOLIC PANEL
ALT: 26 U/L (ref 0–44)
AST: 19 U/L (ref 15–41)
Albumin: 4.4 g/dL (ref 3.5–5.0)
Alkaline Phosphatase: 72 U/L (ref 38–126)
Anion gap: 10 (ref 5–15)
BUN: 18 mg/dL (ref 6–20)
CO2: 25 mmol/L (ref 22–32)
Calcium: 9.3 mg/dL (ref 8.9–10.3)
Chloride: 107 mmol/L (ref 98–111)
Creatinine, Ser: 0.95 mg/dL (ref 0.61–1.24)
GFR calc Af Amer: >60 mL/min (ref >60)
GFR calc non Af Amer: >60 mL/min (ref >60)
Glucose, Bld: 114 mg/dL — ABNORMAL HIGH (ref 70–99)
Potassium: 3.6 mmol/L (ref 3.5–5.1)
Sodium: 142 mmol/L (ref 135–145)
Total Bilirubin: 0.5 mg/dL (ref 0.3–1.2)
Total Protein: 7.3 g/dL (ref 6.5–8.1)

## 2018-12-28 LAB — CBC WITH DIFFERENTIAL/PLATELET
Abs Immature Granulocytes: 0.01 10*3/uL (ref 0.00–0.07)
Basophils Absolute: 0 10*3/uL (ref 0.0–0.1)
Basophils Relative: 1 %
Eosinophils Absolute: 0.2 10*3/uL (ref 0.0–0.5)
Eosinophils Relative: 5 %
HCT: 42.3 % (ref 39.0–52.0)
Hemoglobin: 13.6 g/dL (ref 13.0–17.0)
Immature Granulocytes: 0 %
Lymphocytes Relative: 38 %
Lymphs Abs: 1.1 10*3/uL (ref 0.7–4.0)
MCH: 27.2 pg (ref 26.0–34.0)
MCHC: 32.2 g/dL (ref 30.0–36.0)
MCV: 84.6 fL (ref 80.0–100.0)
Monocytes Absolute: 0.7 10*3/uL (ref 0.1–1.0)
Monocytes Relative: 23 %
Neutro Abs: 1 10*3/uL — ABNORMAL LOW (ref 1.7–7.7)
Neutrophils Relative %: 33 %
Platelets: 290 10*3/uL (ref 150–400)
RBC: 5 MIL/uL (ref 4.22–5.81)
RDW: 16.8 % — ABNORMAL HIGH (ref 11.5–15.5)
WBC: 3 10*3/uL — ABNORMAL LOW (ref 4.0–10.5)
nRBC: 0 % (ref 0.0–0.2)

## 2018-12-28 LAB — LACTATE DEHYDROGENASE: LDH: 155 U/L (ref 98–192)

## 2018-12-28 MED ORDER — HEPARIN SOD (PORK) LOCK FLUSH 100 UNIT/ML IV SOLN
500.0000 [IU] | Freq: Once | INTRAVENOUS | Status: AC
  Administered 2018-12-28: 500 [IU] via INTRAVENOUS

## 2018-12-28 MED ORDER — SODIUM CHLORIDE 0.9% FLUSH
10.0000 mL | Freq: Once | INTRAVENOUS | Status: AC
  Administered 2018-12-28: 10:00:00 10 mL

## 2018-12-28 NOTE — Patient Instructions (Signed)
[facility]  Discharge Instructions:   _______________________________________________________________  Thank you for choosing [facility] at [facility] to provide your oncology and hematology care.  To afford each patient quality time with our providers, please arrive at least 15 minutes before your scheduled appointment.  You need to re-schedule your appointment if you arrive 10 or more minutes late.  We strive to give you quality time with our providers, and arriving late affects you and other patients whose appointments are after yours.  Also, if you no show three or more times for appointments you may be dismissed from the clinic.  Again, thank you for choosing [facility] at [facility]. Our hope is that these requests will allow you access to exceptional care and in a timely manner. _______________________________________________________________  If you have questions after your visit, please contact our office at [phone] between the hours of 8:30 a.m. and 5:00 p.m. Voicemails left after 4:30 p.m. will not be returned until the following business day. _______________________________________________________________  For prescription refill requests, have your pharmacy contact our office. _______________________________________________________________  Recommendations made by the consultant and any test results will be sent to your referring physician. _______________________________________________________________ 

## 2018-12-28 NOTE — Progress Notes (Signed)
Sonia Baller presented for Portacath access and flush. Portacath located in the left chest wall accessed with  H 20 needle. Clean, Dry and Intact No blood return noted. Labs drawn peripheral.  Portacath flushed with 48ml NS and 500U/63ml Heparin per protocol and needle removed intact. Procedure without incident. Patient tolerated procedure well.

## 2019-01-05 ENCOUNTER — Other Ambulatory Visit: Payer: Self-pay

## 2019-01-05 ENCOUNTER — Inpatient Hospital Stay (HOSPITAL_COMMUNITY): Payer: BC Managed Care – PPO | Attending: Hematology | Admitting: Hematology

## 2019-01-05 ENCOUNTER — Encounter (HOSPITAL_COMMUNITY): Payer: Self-pay | Admitting: Hematology

## 2019-01-05 VITALS — BP 141/92 | HR 78 | Temp 97.9°F | Resp 20 | Wt 234.9 lb

## 2019-01-05 DIAGNOSIS — C8108 Nodular lymphocyte predominant Hodgkin lymphoma, lymph nodes of multiple sites: Secondary | ICD-10-CM | POA: Insufficient documentation

## 2019-01-05 DIAGNOSIS — C8104 Nodular lymphocyte predominant Hodgkin lymphoma, lymph nodes of axilla and upper limb: Secondary | ICD-10-CM | POA: Diagnosis not present

## 2019-01-05 DIAGNOSIS — E119 Type 2 diabetes mellitus without complications: Secondary | ICD-10-CM | POA: Insufficient documentation

## 2019-01-05 DIAGNOSIS — Z6830 Body mass index (BMI) 30.0-30.9, adult: Secondary | ICD-10-CM | POA: Diagnosis not present

## 2019-01-05 NOTE — Progress Notes (Signed)
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 ([place])   06/01/2018 -  Chemotherapy   The patient had DOXOrubicin (ADRIAMYCIN) chemo injection 118 mg, 50 mg/m2 = 118 mg, Intravenous,  Once, 6 of 6 cycles Administration: 118 mg (06/01/2018), 118 mg (06/22/2018), 118 mg (07/13/2018), 118 mg (08/03/2018), 118 mg (08/24/2018), 118 mg (09/14/2018) palonosetron (ALOXI) injection 0.25 mg, 0.25 mg, Intravenous,  Once, 6 of 6 cycles Administration: 0.25 mg (06/01/2018), 0.25 mg (06/22/2018), 0.25 mg (07/13/2018), 0.25 mg (08/03/2018), 0.25 mg (08/24/2018), 0.25 mg (09/14/2018) pegfilgrastim (NEULASTA ONPRO KIT) injection 6 mg, 6 mg, Subcutaneous, Once, 6 of 6 cycles Administration: 6 mg (06/01/2018), 6 mg (06/22/2018), 6 mg (07/13/2018), 6 mg (08/03/2018), 6 mg (08/24/2018), 6 mg (09/14/2018) vinCRIStine (ONCOVIN) 2 mg in sodium chloride 0.9 % 50 mL chemo infusion, 2 mg, Intravenous,  Once, 6 of 6 cycles Administration: 2 mg (06/01/2018), 2 mg (06/22/2018), 2 mg (07/13/2018), 2 mg (08/03/2018), 2 mg (08/24/2018), 2 mg (09/14/2018) riTUXimab (RITUXAN) 900 mg in sodium chloride 0.9 % 250 mL (2.6471 mg/mL) infusion, 375 mg/m2 = 900 mg, Intravenous,  Once, 1 of 1 cycle Administration: 900 mg (06/01/2018) cyclophosphamide (CYTOXAN) 1,760 mg in sodium chloride 0.9 % 250 mL chemo infusion, 750 mg/m2 = 1,760 mg, Intravenous,  Once, 6 of 6 cycles Administration: 1,760 mg (06/01/2018), 1,760 mg (06/22/2018), 1,760 mg (07/13/2018), 1,760 mg (08/03/2018), 1,760 mg (08/24/2018), 1,760 mg (09/14/2018) riTUXimab (RITUXAN) 900 mg in sodium  chloride 0.9 % 160 mL infusion, 375 mg/m2 = 900 mg (100 % of original dose 375 mg/m2), Intravenous,  Once, 5 of 5 cycles Dose modification: 375 mg/m2 (original dose 375 mg/m2, Cycle 2) Administration: 900 mg (06/22/2018), 900 mg (07/13/2018), 900 mg (08/03/2018), 900 mg (08/24/2018), 900 mg (09/14/2018) dexrazoxane (ZINECARD) 1,180 mg in lactated ringers 300 mL IVPB, 500 mg/m2 = 1,180 mg (100 % of original dose 500 mg/m2), Intravenous,  Once, 3 of 3 cycles Dose modification: 500 mg/m2 (original dose 500 mg/m2, Cycle 4), 500 mg/m2 (original dose 500 mg/m2, Cycle 4) Administration: 1,180 mg (08/24/2018), 1,180 mg (08/03/2018), 1,180 mg (09/14/2018)  for chemotherapy treatment.       CANCER STAGING: Cancer Staging No matching staging information was found for the patient.   INTERVAL HISTORY:  Devin Tran 53 y.o. male seen for follow-up of Hodgkin's lymphoma.  He reportedly finished radiation about a month ago in [place].  Appetite and energy levels are 100%.  He is continuing to work full-time job.  He reports very occasional night sweats but not drenching type.  He has report an episode once a week.  Denies any lumps or bumps.  No recurrent infections.  No fevers or weight loss.  REVIEW OF SYSTEMS:  Review of Systems  All other systems reviewed and are negative.    PAST MEDICAL/SURGICAL HISTORY:  Past Medical History:  Diagnosis Date   Complication of anesthesia    Erectile dysfunction    Hodgkin's lymphoma (HCC)    Hyperlipidemia    Hypertension    Infertility    Low testosterone    PONV (postoperative nausea and vomiting)    Past Surgical  History:  Procedure Laterality Date   AXILLARY LYMPH NODE BIOPSY Right 05/10/2018   Procedure: AXILLARY LYMPH NODE BIOPSY, RIGHT;  Surgeon: Aviva Signs, MD;  Location: [facility];  Service: General;  Laterality: Right;   IR LYMPHANGIOGRAM EXTREMITY UNI LEFT  03/02/1987   PORTA CATH INSERTION     PORTA CATH REMOVAL     PORTACATH PLACEMENT Left  05/21/2018   Procedure: INSERTION PORT-A-CATH (attached catheter in left subclavian);  Surgeon: Aviva Signs, MD;  Location: [facility];  Service: General;  Laterality: Left;   SPLENECTOMY Left Over 20 years ago   Due to Hodgkin's lymphoma     SOCIAL HISTORY:  Social History   Socioeconomic History   Marital status: Single    Spouse name: Not on file   Number of children: Not on file   Years of education: Not on file   Highest education level: Not on file  Occupational History   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    Fish farm manager: [employer]  Social Needs   Financial resource strain: Not on file   Food insecurity    Worry: Not on file    Inability: Not on file   Transportation needs    Medical: Not on file    Non-medical: Not on file  Tobacco Use   Smoking status: Never Smoker   Smokeless tobacco: Never Used  Substance and Sexual Activity   Alcohol use: No   Drug use: No   Sexual activity: Yes  Lifestyle   Physical activity    Days per week: Not on file    Minutes per session: Not on file   Stress: Not on file  Relationships   Social connections    Talks on phone: Not on file    Gets together: Not on file    Attends religious service: Not on file    Active member of club or organization: Not on file    Attends meetings of clubs or organizations: Not on file    Relationship status: Not on file   Intimate partner violence    Fear of current or ex partner: Not on file    Emotionally abused: Not on file    Physically abused: Not on file    Forced sexual activity: Not on file  Other Topics Concern   Not on file  Social History Narrative   Not on file    FAMILY HISTORY:  Family History  Problem Relation Age of Onset   Hypertension Mother    Cancer Father        lung   Hypertension Father    Diabetes Father    COPD Father    Hypertension Brother    Stroke Brother     CURRENT MEDICATIONS:  Outpatient Encounter Medications as of  01/05/2019  Medication Sig   amLODipine (NORVASC) 10 MG tablet TAKE 1 TABLET BY MOUTH EVERY DAY   Ascorbic Acid (VITAMIN C) 1000 MG tablet Take 1,000 mg by mouth daily. Vitamin c lozenge   aspirin EC 81 MG tablet Take 81 mg by mouth daily.    glipiZIDE (GLUCOTROL XL) 5 MG 24 hr tablet TAKE 1 TABLET BY MOUTH EVERY DAY WITH BREAKFAST   Multiple Vitamins-Minerals (ONE-A-DAY MENS HEALTH FORMULA PO) Take 1 tablet by mouth daily.    [DISCONTINUED] allopurinol (ZYLOPRIM) 300 MG tablet TAKE 1 TABLET BY MOUTH EVERY DAY   [DISCONTINUED] CYCLOPHOSPHAMIDE IV Inject into the vein every 21 ( twenty-one) days.   lidocaine-prilocaine (EMLA) cream Apply pea-sized amount to port a cath  site and cover with plastic wrap one hour prior to each chemotherapy appointment (Patient not taking: Reported on 10/06/2018)   [DISCONTINUED] allopurinol (ZYLOPRIM) 300 MG tablet TAKE 1 TABLET BY MOUTH EVERY DAY   [DISCONTINUED] DOXOrubicin (ADRIAMYCIN) 2 MG/ML injection Inject into the vein every 21 ( twenty-one) days.   [DISCONTINUED] DOXOrubicin (ADRIAMYCIN) 2 MG/ML injection Inject into the vein.   [DISCONTINUED] glipiZIDE (GLUCOTROL XL) 5 MG 24 hr tablet TAKE 1 TABLET BY MOUTH EVERY DAY WITH BREAKFAST   [DISCONTINUED] lidocaine-prilocaine (EMLA) cream Apply pea-sized amount to port a cath site and cover with plastic wrap one hour prior to each chemotherapy appointment   [DISCONTINUED] ondansetron (ZOFRAN-ODT) 8 MG disintegrating tablet Take 8 mg by mouth as needed.    [DISCONTINUED] prochlorperazine (COMPAZINE) 10 MG tablet Take 1 tablet (10 mg total) by mouth every 6 (six) hours as needed (Nausea or vomiting). (Patient not taking: Reported on 10/06/2018)   [DISCONTINUED] prochlorperazine (COMPAZINE) 10 MG tablet Take by mouth.   [DISCONTINUED] Propylene Glycol (SYSTANE BALANCE) 0.6 % SOLN Place 1 drop into both eyes 2 (two) times daily as needed (dry eyes).   [DISCONTINUED] Propylene Glycol 0.6 % SOLN Apply to  eye.   [DISCONTINUED] riTUXimab (RITUXAN IV) Inject into the vein every 21 ( twenty-one) days.   [DISCONTINUED] vinCRIStine 2 mg in sodium chloride 0.9 % 50 mL Inject 2 mg into the vein every 21 ( twenty-one) days.   No facility-administered encounter medications on file as of 01/05/2019.     ALLERGIES:  Allergies  Allergen Reactions   Bleomycin Other (See Comments)    Cramping all over     PHYSICAL EXAM:  ECOG Performance status: 1  Vitals:   01/05/19 1155  BP: (!) 141/92  Pulse: 78  Resp: 20  Temp: 97.9 F (36.6 C)  SpO2: 99%   Filed Weights   01/05/19 1155  Weight: 234 lb 14.4 oz (106.5 kg)    Physical Exam Vitals signs reviewed.  Constitutional:      Appearance: Normal appearance.  Cardiovascular:     Rate and Rhythm: Normal rate and regular rhythm.     Heart sounds: Normal heart sounds.  Pulmonary:     Effort: Pulmonary effort is normal.     Breath sounds: Normal breath sounds.  Abdominal:     General: There is no distension.     Palpations: Abdomen is soft. There is no mass.  Musculoskeletal:        General: No swelling.  Lymphadenopathy:     Cervical: No cervical adenopathy.  Skin:    General: Skin is warm.  Neurological:     General: No focal deficit present.     Mental Status: He is alert and oriented to person, place, and time.  Psychiatric:        Mood and Affect: Mood normal.        Behavior: Behavior normal.   No palpable adenopathy.    LABORATORY DATA:  I have reviewed the labs as listed.  CBC    Component Value Date/Time   WBC 3.0 (L) 12/28/2018 1012   RBC 5.00 12/28/2018 1012   HGB 13.6 12/28/2018 1012   HGB 16.1 05/03/2018 1553   HCT 42.3 12/28/2018 1012   HCT 49.8 05/03/2018 1553   PLT 290 12/28/2018 1012   PLT 280 05/03/2018 1553   MCV 84.6 12/28/2018 1012   MCV 82 05/03/2018 1553   MCH 27.2 12/28/2018 1012   MCHC 32.2 12/28/2018 1012   RDW 16.8 (H) 12/28/2018 1012  RDW 14.4 05/03/2018 1553   LYMPHSABS 1.1  12/28/2018 1012   LYMPHSABS 4.5 (H) 05/03/2018 1553   MONOABS 0.7 12/28/2018 1012   EOSABS 0.2 12/28/2018 1012   EOSABS 0.2 05/03/2018 1553   BASOSABS 0.0 12/28/2018 1012   BASOSABS 0.1 05/03/2018 1553   CMP Latest Ref Rng & Units 12/28/2018 09/14/2018 09/03/2018  Glucose 70 - 99 mg/dL 114(H) 227(H) 256(H)  BUN 6 - 20 mg/dL '18 12 12  ' Creatinine 0.61 - 1.24 mg/dL 0.95 0.96 1.04  Sodium 135 - 145 mmol/L 142 140 136  Potassium 3.5 - 5.1 mmol/L 3.6 3.8 3.1(L)  Chloride 98 - 111 mmol/L 107 106 104  CO2 22 - 32 mmol/L '25 22 22  ' Calcium 8.9 - 10.3 mg/dL 9.3 9.2 8.7(L)  Total Protein 6.5 - 8.1 g/dL 7.3 7.3 6.9  Total Bilirubin 0.3 - 1.2 mg/dL 0.5 0.4 0.3  Alkaline Phos 38 - 126 U/L 72 58 80  AST 15 - 41 U/L '19 16 16  ' ALT 0 - 44 U/L '26 20 17       ' DIAGNOSTIC IMAGING:  I have independently reviewed the scans and discussed with the patient.   I have reviewed Venita Lick LPN's note and agree with the documentation.  I personally performed a face-to-face visit, made revisions and my assessment and plan is as follows.    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  NLP Hodgkin's lymphoma (recurrent 3B): -History of stage IV NLP Hodgkin's lymphoma with liver involvement diagnosed in 1989, treated with MOPP-ABVD.  He underwent splenectomy. -Recurrence in April 1992, treated with CCNU, etoposide and cisplatin for 6 cycles. - Presentation in March with right axillary mass, night sweats for 2 months and no fever or weight loss. -PET scan on 05/12/2018 showed hypermetabolic lymphadenopathy in the bilateral axillary, subpectoral region, right sub-clavicular region, and portacaval space. -Biopsy of the right axillary lymph node on 05/10/2018 showed B-cell lymphoma, phenotypically consistent with NLP HL. - 6 cycles of R-CHOP from 05/24/2018 through 09/14/2018. -Interim PET scan on 07/26/2018 showed significant reduction in size and FDG uptake  associated with thoracic and upper abdominal adenopathy.  Residual PET uptake in the thoracic lymph nodes is low level compatible with Deauville 2.   -PET scan on 10/04/1998 6:20 cycles of chemotherapy showed right axillary lymph node measuring 1.8 cm with SUV of 1.92.  There are some right pectoral lymph nodes with SUV 1.5.  Overall this represents Deauville 2 which is a very good response. -He received radiation therapy to the right axillary region and finished it a month ago. -He is able to work full-time job.  He does report occasional night sweats but not as bad as prior to therapy.  Denies any fevers or weight loss. -Physical exam today did not reveal any palpable adenopathy.  I will see him back in 2 months for follow-up.  I plan to repeat PET scan prior to next visit along with labs. -he has mild leukopenia and neutropenia from recent radiation therapy.  Also reports minor discomfort at the port site when he lifts weights.  2.  Cardioprotection: - He received Adriamycin 200 mg per metered square when he received chemotherapy in 1989. - We added dexrazoxane for cardioprotection from cycle 4 onwards. -Last echo on 08/18/2018 showed EF of 60-65%.  3.  Post splenectomy state: -Received Menactra and Pneumovax on 05/25/2018. -He received flu shot past season.  4.  Diabetes: - Hemoglobin A1c on 07/13/2018  was high at 8.6. - We have increased his glipizide XR to 5 mg daily on 08/24/2018.     Orders placed this encounter:  Orders Placed This Encounter  Procedures   NM PET Image Restag (PS) Skull Base To Thigh   CBC with Differential/Platelet   Comprehensive metabolic panel   Lactate dehydrogenase      Derek Jack, MD [place] [phone]

## 2019-01-05 NOTE — Assessment & Plan Note (Addendum)
  1.  NLP Hodgkin's lymphoma (recurrent 3B): -History of stage IV NLP Hodgkin's lymphoma with liver involvement diagnosed in 1989, treated with MOPP-ABVD.  He underwent splenectomy. -Recurrence in April 1992, treated with CCNU, etoposide and cisplatin for 6 cycles. - Presentation in March with right axillary mass, night sweats for 2 months and no fever or weight loss. -PET scan on 05/12/2018 showed hypermetabolic lymphadenopathy in the bilateral axillary, subpectoral region, right sub-clavicular region, and portacaval space. -Biopsy of the right axillary lymph node on 05/10/2018 showed B-cell lymphoma, phenotypically consistent with NLP HL. - 6 cycles of R-CHOP from 05/24/2018 through 09/14/2018. -Interim PET scan on 07/26/2018 showed significant reduction in size and FDG uptake associated with thoracic and upper abdominal adenopathy.  Residual PET uptake in the thoracic lymph nodes is low level compatible with Deauville 2.   -PET scan on 10/04/1998 6:20 cycles of chemotherapy showed right axillary lymph node measuring 1.8 cm with SUV of 1.92.  There are some right pectoral lymph nodes with SUV 1.5.  Overall this represents Deauville 2 which is a very good response. -He received radiation therapy to the right axillary region and finished it a month ago. -He is able to work full-time job.  He does report occasional night sweats but not as bad as prior to therapy.  Denies any fevers or weight loss. -Physical exam today did not reveal any palpable adenopathy.  I will see him back in 2 months for follow-up.  I plan to repeat PET scan prior to next visit along with labs. -he has mild leukopenia and neutropenia from recent radiation therapy.  Also reports minor discomfort at the port site when he lifts weights.  2.  Cardioprotection: - He received Adriamycin 200 mg per metered square when he received chemotherapy in 1989. - We added dexrazoxane for cardioprotection from cycle 4 onwards. -Last echo on 08/18/2018  showed EF of 60-65%.  3.  Post splenectomy state: -Received Menactra and Pneumovax on 05/25/2018. -He received flu shot past season.  4.  Diabetes: - Hemoglobin A1c on 07/13/2018 was high at 8.6. - We have increased his glipizide XR to 5 mg daily on 08/24/2018.

## 2019-01-05 NOTE — Patient Instructions (Signed)
[place] at [facility] Discharge Instructions  You were seen today by Dr. Delton Coombes. He went over your recent lab results. He will schedule you for a repeat your scan in late February. He will see you back after your scan for labs and follow up.   Thank you for choosing [place] at [facility] to provide your oncology and hematology care.  To afford each patient quality time with our provider, please arrive at least 15 minutes before your scheduled appointment time.   If you have a lab appointment with the [facility] please come in thru the  Main Entrance and check in at the main information desk  You need to re-schedule your appointment should you arrive 10 or more minutes late.  We strive to give you quality time with our providers, and arriving late affects you and other patients whose appointments are after yours.  Also, if you no show three or more times for appointments you may be dismissed from the clinic at the providers discretion.     Again, thank you for choosing [facility].  Our hope is that these requests will decrease the amount of time that you wait before being seen by our physicians.       _____________________________________________________________  Should you have questions after your visit to [facility], please contact our office at [phone] between the hours of 8:00 a.m. and 4:30 p.m.  Voicemails left after 4:00 p.m. will not be returned until the following business day.  For prescription refill requests, have your pharmacy contact our office and allow 72 hours.    Cancer Center Support Programs:   > Cancer Support Group  2nd Tuesday of the month 1pm-2pm, Journey Room

## 2019-02-09 ENCOUNTER — Encounter: Payer: Self-pay | Admitting: Family Medicine

## 2019-02-09 ENCOUNTER — Ambulatory Visit (INDEPENDENT_AMBULATORY_CARE_PROVIDER_SITE_OTHER): Payer: 59 | Admitting: Family Medicine

## 2019-02-09 ENCOUNTER — Other Ambulatory Visit: Payer: Self-pay

## 2019-02-09 DIAGNOSIS — I10 Essential (primary) hypertension: Secondary | ICD-10-CM

## 2019-02-09 DIAGNOSIS — Z8571 Personal history of Hodgkin lymphoma: Secondary | ICD-10-CM

## 2019-02-09 DIAGNOSIS — E782 Mixed hyperlipidemia: Secondary | ICD-10-CM

## 2019-02-09 DIAGNOSIS — E291 Testicular hypofunction: Secondary | ICD-10-CM

## 2019-02-09 NOTE — Progress Notes (Signed)
Subjective:    Patient ID: Devin Tran, male    DOB: May 18, 1965, 54 y.o.   MRN: [id number]   HPI: Devin Tran is a 54 y.o. male presenting for  presents for  follow-up of hypertension. Patient has no history of headache chest pain or shortness of breath or recent cough. Patient also denies symptoms of TIA such as focal numbness or weakness. Patient denies side effects from medication. States taking it regularly.  Not checking blood pressure.It was good when he was getting Chemo three months ago.   Still taking med for diabetes. Glucose high during chemo. Had A1c of 8.6  Last summer.Oncologist put him on glipizide. Wondering if it is still needed.  Depression screen [facility] 2/9 01/12/2018 07/13/2017 11/11/2016 05/12/2016 04/02/2015  Decreased Interest 0 0 0 0 0  Down, Depressed, Hopeless 0 0 0 0 0  PHQ - 2 Score 0 0 0 0 0    Not checking BP lately. Relevant past medical, surgical, family and social history reviewed and updated as indicated.  Interim medical history since our last visit reviewed. Allergies and medications reviewed and updated.  ROS:  Review of Systems  Constitutional: Negative.   HENT: Negative.   Eyes: Negative for visual disturbance.  Respiratory: Negative for cough and shortness of breath.   Cardiovascular: Negative for chest pain and leg swelling.  Gastrointestinal: Negative for abdominal pain, diarrhea, nausea and vomiting.  Genitourinary: Negative for difficulty urinating.  Musculoskeletal: Negative for arthralgias and myalgias.  Skin: Negative for rash.  Neurological: Negative for headaches.  Psychiatric/Behavioral: Negative for sleep disturbance.     Social History   Tobacco Use  Smoking Status Never Smoker  Smokeless Tobacco Never Used       Objective:     Wt Readings from Last 3 Encounters:  01/05/19 234 lb 14.4 oz (106.5 kg)  10/06/18 219 lb (99.3 kg)  09/14/18 216 lb (98 kg)     Exam deferred. Pt. Harboring due to COVID 19. Phone visit  performed.   Assessment & Plan:   1. Essential hypertension   2. Mixed hyperlipidemia   3. Hypogonadism male   4. Hx of Hodgkins lymphoma     No orders of the defined types were placed in this encounter.   Orders Placed This Encounter  Procedures  . A1c  . CMP    Order Specific Question:   Has the patient fasted?    Answer:   Yes  . CBC      Diagnoses and all orders for this visit:  Essential hypertension -     A1c -     CMP -     CBC  Mixed hyperlipidemia  Hypogonadism male -     A1c  Hx of Hodgkins lymphoma -     A1c    Virtual Visit via telephone Note  I discussed the limitations, risks, security and privacy concerns of performing an evaluation and management service by telephone and the availability of in person appointments. The patient was identified with two identifiers. Pt.expressed understanding and agreed to proceed. Pt. Is at home. Dr. Livia Snellen is in his office.  Follow Up Instructions:   I discussed the assessment and treatment plan with the patient. The patient was provided an opportunity to ask questions and all were answered. The patient agreed with the plan and demonstrated an understanding of the instructions.   The patient was advised to call back or seek an in-person evaluation if the symptoms worsen or if the  condition fails to improve as anticipated.   Total minutes including chart review and phone contact time: 23   Follow up plan: Return in about 6 months (around 08/09/2019), or if symptoms worsen or fail to improve.  [name], MD [place]

## 2019-02-10 ENCOUNTER — Other Ambulatory Visit: Payer: Self-pay

## 2019-02-10 ENCOUNTER — Other Ambulatory Visit: Payer: Self-pay | Admitting: Family Medicine

## 2019-02-10 ENCOUNTER — Other Ambulatory Visit: Payer: 59

## 2019-02-10 LAB — BAYER DCA HB A1C WAIVED: HB A1C (BAYER DCA - WAIVED): 6.6 % (ref <7.0)

## 2019-02-11 LAB — CBC WITH DIFFERENTIAL/PLATELET
Basophils Absolute: 0 10*3/uL (ref 0.0–0.2)
Basos: 1 %
EOS (ABSOLUTE): 0.1 10*3/uL (ref 0.0–0.4)
Eos: 4 %
Hematocrit: 42 % (ref 37.5–51.0)
Hemoglobin: 13.6 g/dL (ref 13.0–17.7)
Immature Grans (Abs): 0 10*3/uL (ref 0.0–0.1)
Immature Granulocytes: 0 %
Lymphocytes Absolute: 1.8 10*3/uL (ref 0.7–3.1)
Lymphs: 45 %
MCH: 26.8 pg (ref 26.6–33.0)
MCHC: 32.4 g/dL (ref 31.5–35.7)
MCV: 83 fL (ref 79–97)
Monocytes Absolute: 0.7 10*3/uL (ref 0.1–0.9)
Monocytes: 18 %
Neutrophils Absolute: 1.3 10*3/uL — ABNORMAL LOW (ref 1.4–7.0)
Neutrophils: 32 %
Platelets: 275 10*3/uL (ref 150–450)
RBC: 5.08 x10E6/uL (ref 4.14–5.80)
RDW: 15.2 % (ref 11.6–15.4)
WBC: 4 10*3/uL (ref 3.4–10.8)

## 2019-02-11 LAB — CMP14+EGFR
ALT: 27 IU/L (ref 0–44)
AST: 19 IU/L (ref 0–40)
Albumin/Globulin Ratio: 2.2 (ref 1.2–2.2)
Albumin: 4.8 g/dL (ref 3.8–4.9)
Alkaline Phosphatase: 82 IU/L (ref 39–117)
BUN/Creatinine Ratio: 15 (ref 9–20)
BUN: 17 mg/dL (ref 6–24)
Bilirubin Total: 0.3 mg/dL (ref 0.0–1.2)
CO2: 25 mmol/L (ref 20–29)
Calcium: 9.8 mg/dL (ref 8.7–10.2)
Chloride: 106 mmol/L (ref 96–106)
Creatinine, Ser: 1.11 mg/dL (ref 0.76–1.27)
GFR calc Af Amer: 88 mL/min/{1.73_m2} (ref >59)
GFR calc non Af Amer: 76 mL/min/{1.73_m2} (ref >59)
Globulin, Total: 2.2 g/dL (ref 1.5–4.5)
Glucose: 79 mg/dL (ref 65–99)
Potassium: 4.2 mmol/L (ref 3.5–5.2)
Sodium: 144 mmol/L (ref 134–144)
Total Protein: 7 g/dL (ref 6.0–8.5)

## 2019-03-21 ENCOUNTER — Other Ambulatory Visit: Payer: Self-pay

## 2019-03-21 ENCOUNTER — Inpatient Hospital Stay (HOSPITAL_COMMUNITY): Payer: 59

## 2019-03-21 ENCOUNTER — Encounter (HOSPITAL_COMMUNITY): Payer: 59

## 2019-03-21 ENCOUNTER — Inpatient Hospital Stay (HOSPITAL_COMMUNITY): Payer: 59 | Attending: Hematology

## 2019-03-21 ENCOUNTER — Other Ambulatory Visit (HOSPITAL_COMMUNITY): Payer: 59

## 2019-03-21 ENCOUNTER — Encounter (HOSPITAL_COMMUNITY)
Admission: RE | Admit: 2019-03-21 | Discharge: 2019-03-21 | Disposition: A | Discharge status: Home / Self Care | Payer: 59 | Source: Ambulatory Visit | Attending: Hematology | Admitting: Hematology

## 2019-03-21 DIAGNOSIS — C8108 Nodular lymphocyte predominant Hodgkin lymphoma, lymph nodes of multiple sites: Secondary | ICD-10-CM | POA: Diagnosis present

## 2019-03-21 DIAGNOSIS — E119 Type 2 diabetes mellitus without complications: Secondary | ICD-10-CM | POA: Insufficient documentation

## 2019-03-21 DIAGNOSIS — Z452 Encounter for adjustment and management of vascular access device: Secondary | ICD-10-CM | POA: Insufficient documentation

## 2019-03-21 DIAGNOSIS — Z9081 Acquired absence of spleen: Secondary | ICD-10-CM | POA: Insufficient documentation

## 2019-03-21 LAB — CBC WITH DIFFERENTIAL/PLATELET
Abs Immature Granulocytes: 0.1 10*3/uL — ABNORMAL HIGH (ref 0.00–0.07)
Basophils Absolute: 0 10*3/uL (ref 0.0–0.1)
Basophils Relative: 1 %
Eosinophils Absolute: 0.1 10*3/uL (ref 0.0–0.5)
Eosinophils Relative: 3 %
HCT: 44.8 % (ref 39.0–52.0)
Hemoglobin: 14.5 g/dL (ref 13.0–17.0)
Immature Granulocytes: 2 %
Lymphocytes Relative: 38 %
Lymphs Abs: 2 10*3/uL (ref 0.7–4.0)
MCH: 27.6 pg (ref 26.0–34.0)
MCHC: 32.4 g/dL (ref 30.0–36.0)
MCV: 85.2 fL (ref 80.0–100.0)
Monocytes Absolute: 0.9 10*3/uL (ref 0.1–1.0)
Monocytes Relative: 18 %
Neutro Abs: 2 10*3/uL (ref 1.7–7.7)
Neutrophils Relative %: 38 %
Platelets: 270 10*3/uL (ref 150–400)
RBC: 5.26 MIL/uL (ref 4.22–5.81)
RDW: 16.2 % — ABNORMAL HIGH (ref 11.5–15.5)
WBC: 5.2 10*3/uL (ref 4.0–10.5)
nRBC: 0 % (ref 0.0–0.2)

## 2019-03-21 LAB — COMPREHENSIVE METABOLIC PANEL
ALT: 28 U/L (ref 0–44)
AST: 22 U/L (ref 15–41)
Albumin: 4.7 g/dL (ref 3.5–5.0)
Alkaline Phosphatase: 71 U/L (ref 38–126)
Anion gap: 11 (ref 5–15)
BUN: 17 mg/dL (ref 6–20)
CO2: 26 mmol/L (ref 22–32)
Calcium: 9.5 mg/dL (ref 8.9–10.3)
Chloride: 106 mmol/L (ref 98–111)
Creatinine, Ser: 1.09 mg/dL (ref 0.61–1.24)
GFR calc Af Amer: >60 mL/min (ref >60)
GFR calc non Af Amer: >60 mL/min (ref >60)
Glucose, Bld: 92 mg/dL (ref 70–99)
Potassium: 3.7 mmol/L (ref 3.5–5.1)
Sodium: 143 mmol/L (ref 135–145)
Total Bilirubin: 0.8 mg/dL (ref 0.3–1.2)
Total Protein: 7.6 g/dL (ref 6.5–8.1)

## 2019-03-21 LAB — LACTATE DEHYDROGENASE: LDH: 225 U/L — ABNORMAL HIGH (ref 98–192)

## 2019-03-21 MED ORDER — FLUDEOXYGLUCOSE F - 18 (FDG) INJECTION
13.9800 | Freq: Once | INTRAVENOUS | Status: AC | PRN
  Administered 2019-03-21: 13.98 via INTRAVENOUS

## 2019-03-23 ENCOUNTER — Encounter (HOSPITAL_COMMUNITY): Payer: Self-pay | Admitting: Hematology

## 2019-03-23 ENCOUNTER — Ambulatory Visit (HOSPITAL_COMMUNITY): Payer: BC Managed Care – PPO | Admitting: Hematology

## 2019-03-23 ENCOUNTER — Encounter (HOSPITAL_COMMUNITY): Payer: BC Managed Care – PPO

## 2019-03-23 ENCOUNTER — Inpatient Hospital Stay (HOSPITAL_BASED_OUTPATIENT_CLINIC_OR_DEPARTMENT_OTHER): Payer: 59 | Admitting: Hematology

## 2019-03-23 ENCOUNTER — Inpatient Hospital Stay (HOSPITAL_COMMUNITY): Payer: 59

## 2019-03-23 ENCOUNTER — Other Ambulatory Visit: Payer: Self-pay

## 2019-03-23 VITALS — BP 138/96 | HR 84 | Temp 97.1°F | Resp 18 | Wt 240.4 lb

## 2019-03-23 DIAGNOSIS — C8108 Nodular lymphocyte predominant Hodgkin lymphoma, lymph nodes of multiple sites: Secondary | ICD-10-CM

## 2019-03-23 MED ORDER — SODIUM CHLORIDE 0.9% FLUSH
10.0000 mL | INTRAVENOUS | Status: DC | PRN
  Administered 2019-03-23: 10 mL via INTRAVENOUS

## 2019-03-23 MED ORDER — HEPARIN SOD (PORK) LOCK FLUSH 100 UNIT/ML IV SOLN
500.0000 [IU] | Freq: Once | INTRAVENOUS | Status: AC
  Administered 2019-03-23: 500 [IU] via INTRAVENOUS

## 2019-03-23 NOTE — Progress Notes (Signed)
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 ([place])   06/01/2018 -  Chemotherapy   The patient had DOXOrubicin (ADRIAMYCIN) chemo injection 118 mg, 50 mg/m2 = 118 mg, Intravenous,  Once, 6 of 6 cycles Administration: 118 mg (06/01/2018), 118 mg (06/22/2018), 118 mg (07/13/2018), 118 mg (08/03/2018), 118 mg (08/24/2018), 118 mg (09/14/2018) palonosetron (ALOXI) injection 0.25 mg, 0.25 mg, Intravenous,  Once, 6 of 6 cycles Administration: 0.25 mg (06/01/2018), 0.25 mg (06/22/2018), 0.25 mg (07/13/2018), 0.25 mg (08/03/2018), 0.25 mg (08/24/2018), 0.25 mg (09/14/2018) pegfilgrastim (NEULASTA ONPRO KIT) injection 6 mg, 6 mg, Subcutaneous, Once, 6 of 6 cycles Administration: 6 mg (06/01/2018), 6 mg (06/22/2018), 6 mg (07/13/2018), 6 mg (08/03/2018), 6 mg (08/24/2018), 6 mg (09/14/2018) vinCRIStine (ONCOVIN) 2 mg in sodium chloride 0.9 % 50 mL chemo infusion, 2 mg, Intravenous,  Once, 6 of 6 cycles Administration: 2 mg (06/01/2018), 2 mg (06/22/2018), 2 mg (07/13/2018), 2 mg (08/03/2018), 2 mg (08/24/2018), 2 mg (09/14/2018) riTUXimab (RITUXAN) 900 mg in sodium chloride 0.9 % 250 mL (2.6471 mg/mL) infusion, 375 mg/m2 = 900 mg, Intravenous,  Once, 1 of 1 cycle Administration: 900 mg (06/01/2018) cyclophosphamide (CYTOXAN) 1,760 mg in sodium chloride 0.9 % 250 mL chemo infusion, 750 mg/m2 = 1,760 mg, Intravenous,  Once, 6 of 6 cycles Administration: 1,760 mg (06/01/2018), 1,760 mg (06/22/2018), 1,760 mg (07/13/2018), 1,760 mg (08/03/2018), 1,760 mg (08/24/2018), 1,760 mg (09/14/2018) riTUXimab (RITUXAN) 900 mg in sodium  chloride 0.9 % 160 mL infusion, 375 mg/m2 = 900 mg (100 % of original dose 375 mg/m2), Intravenous,  Once, 5 of 5 cycles Dose modification: 375 mg/m2 (original dose 375 mg/m2, Cycle 2) Administration: 900 mg (06/22/2018), 900 mg (07/13/2018), 900 mg (08/03/2018), 900 mg (08/24/2018), 900 mg (09/14/2018) dexrazoxane (ZINECARD) 1,180 mg in lactated ringers 300 mL IVPB, 500 mg/m2 = 1,180 mg (100 % of original dose 500 mg/m2), Intravenous,  Once, 3 of 3 cycles Dose modification: 500 mg/m2 (original dose 500 mg/m2, Cycle 4), 500 mg/m2 (original dose 500 mg/m2, Cycle 4) Administration: 1,180 mg (08/24/2018), 1,180 mg (08/03/2018), 1,180 mg (09/14/2018)  for chemotherapy treatment.       CANCER STAGING: Cancer Staging No matching staging information was found for the patient.   INTERVAL HISTORY:  Devin Tran 54 y.o. male seen for follow-up of Hodgkin's lymphoma.  He is working full-time job.  Denies any night sweats.  Appetite and energy levels are 100%.  Numbness in the hands has been stable and on and off.  No recent infections or hospitalizations.  REVIEW OF SYSTEMS:  Review of Systems  Neurological: Positive for numbness.  All other systems reviewed and are negative.    PAST MEDICAL/SURGICAL HISTORY:  Past Medical History:  Diagnosis Date  . Complication of anesthesia   . Erectile dysfunction   . Hodgkin's lymphoma ([place])   . Hyperlipidemia   . Hypertension   . Infertility   . Low testosterone   . PONV (postoperative nausea and vomiting)    Past Surgical History:  Procedure Laterality Date  . AXILLARY LYMPH NODE BIOPSY Right 05/10/2018   Procedure: AXILLARY LYMPH NODE BIOPSY, RIGHT;  Surgeon: Aviva Signs, MD;  Location: [facility];  Service: General;  Laterality: Right;  . IR LYMPHANGIOGRAM EXTREMITY UNI LEFT  03/02/1987  . PORTA CATH INSERTION    . PORTA CATH REMOVAL    . PORTACATH PLACEMENT Left 05/21/2018   Procedure: INSERTION PORT-A-CATH (attached catheter in left subclavian);  Surgeon:  Aviva Signs, MD;  Location: [facility];  Service: General;  Laterality: Left;  . SPLENECTOMY Left Over 20 years ago   Due to Hodgkin's lymphoma     SOCIAL HISTORY:  Social History   Socioeconomic History  . Marital status: Single    Spouse name: Not on file  . Number of children: Not on file  . Years of education: Not on file  . Highest education level: Not on file  Occupational History  . Occupation: Drive Chief Operating Officer: [employer]  Tobacco Use  . Smoking status: Never Smoker  . Smokeless tobacco: Never Used  Substance and Sexual Activity  . Alcohol use: No  . Drug use: No  . Sexual activity: Yes  Other Topics Concern  . Not on file  Social History Narrative  . Not on file   Social Determinants of Health   Financial Resource Strain:   . Difficulty of Paying Living Expenses: Not on file  Food Insecurity:   . Worried About Charity fundraiser in the Last Year: Not on file  . Ran Out of Food in the Last Year: Not on file  Transportation Needs:   . Lack of Transportation (Medical): Not on file  . Lack of Transportation (Non-Medical): Not on file  Physical Activity:   . Days of Exercise per Week: Not on file  . Minutes of Exercise per Session: Not on file  Stress:   . Feeling of Stress : Not on file  Social Connections:   . Frequency of Communication with Friends and Family: Not on file  . Frequency of Social Gatherings with Friends and Family: Not on file  . Attends Religious Services: Not on file  . Active Member of Clubs or Organizations: Not on file  . Attends Archivist Meetings: Not on file  . Marital Status: Not on file  Intimate Partner Violence:   . Fear of Current or Ex-Partner: Not on file  . Emotionally Abused: Not on file  . Physically Abused: Not on file  . Sexually Abused: Not on file    FAMILY HISTORY:  Family History  Problem Relation Age of Onset  . Hypertension Mother   . Cancer Father        lung  . Hypertension  Father   . Diabetes Father   . COPD Father   . Hypertension Brother   . Stroke Brother     CURRENT MEDICATIONS:  Outpatient Encounter Medications as of 03/23/2019  Medication Sig  . amLODipine (NORVASC) 10 MG tablet TAKE 1 TABLET BY MOUTH EVERY DAY  . Ascorbic Acid (VITAMIN C) 1000 MG tablet Take 1,000 mg by mouth daily. Vitamin c lozenge  . aspirin EC 81 MG tablet Take 81 mg by mouth daily.   Marland Kitchen glipiZIDE (GLUCOTROL XL) 5 MG 24 hr tablet TAKE 1 TABLET BY MOUTH EVERY DAY WITH BREAKFAST  . Multiple Vitamins-Minerals (ONE-A-DAY MENS HEALTH FORMULA PO) Take 1 tablet by mouth daily.   Marland Kitchen lidocaine-prilocaine (EMLA) cream Apply pea-sized amount to port a cath site and cover with plastic wrap one hour prior to each chemotherapy appointment (Patient not taking: Reported on 10/06/2018)  No facility-administered encounter medications on file as of 03/23/2019.    ALLERGIES:  Allergies  Allergen Reactions  . Bleomycin Other (See Comments)    Cramping all over     PHYSICAL EXAM:  ECOG Performance status: 1  Vitals:   03/23/19 0812  BP: (!) 138/96  Pulse: 84  Resp: 18  Temp: (!) 97.1 F (36.2 C)  SpO2: 96%   Filed Weights   03/23/19 0812  Weight: 240 lb 6.4 oz (109 kg)    Physical Exam Vitals reviewed.  Constitutional:      Appearance: Normal appearance.  Cardiovascular:     Rate and Rhythm: Normal rate and regular rhythm.     Heart sounds: Normal heart sounds.  Pulmonary:     Effort: Pulmonary effort is normal.     Breath sounds: Normal breath sounds.  Abdominal:     General: There is no distension.     Palpations: Abdomen is soft. There is no mass.  Musculoskeletal:        General: No swelling.  Lymphadenopathy:     Cervical: No cervical adenopathy.  Skin:    General: Skin is warm.  Neurological:     General: No focal deficit present.     Mental Status: He is alert and oriented to person, place, and time.  Psychiatric:        Mood and Affect: Mood normal.         Behavior: Behavior normal.       LABORATORY DATA:  I have reviewed the labs as listed.  CBC    Component Value Date/Time   WBC 5.2 03/21/2019 1550   RBC 5.26 03/21/2019 1550   HGB 14.5 03/21/2019 1550   HGB 13.6 02/10/2019 1634   HCT 44.8 03/21/2019 1550   HCT 42.0 02/10/2019 1634   PLT 270 03/21/2019 1550   PLT 275 02/10/2019 1634   MCV 85.2 03/21/2019 1550   MCV 83 02/10/2019 1634   MCH 27.6 03/21/2019 1550   MCHC 32.4 03/21/2019 1550   RDW 16.2 (H) 03/21/2019 1550   RDW 15.2 02/10/2019 1634   LYMPHSABS 2.0 03/21/2019 1550   LYMPHSABS 1.8 02/10/2019 1634   MONOABS 0.9 03/21/2019 1550   EOSABS 0.1 03/21/2019 1550   EOSABS 0.1 02/10/2019 1634   BASOSABS 0.0 03/21/2019 1550   BASOSABS 0.0 02/10/2019 1634   CMP Latest Ref Rng & Units 03/21/2019 02/10/2019 12/28/2018  Glucose 70 - 99 mg/dL 92 79 114(H)  BUN 6 - 20 mg/dL '17 17 18  ' Creatinine 0.61 - 1.24 mg/dL 1.09 1.11 0.95  Sodium 135 - 145 mmol/L 143 144 142  Potassium 3.5 - 5.1 mmol/L 3.7 4.2 3.6  Chloride 98 - 111 mmol/L 106 106 107  CO2 22 - 32 mmol/L '26 25 25  ' Calcium 8.9 - 10.3 mg/dL 9.5 9.8 9.3  Total Protein 6.5 - 8.1 g/dL 7.6 7.0 7.3  Total Bilirubin 0.3 - 1.2 mg/dL 0.8 0.3 0.5  Alkaline Phos 38 - 126 U/L 71 82 72  AST 15 - 41 U/L '22 19 19  ' ALT 0 - 44 U/L '28 27 26       ' DIAGNOSTIC IMAGING:  I have independently reviewed the scans and discussed with the patient.   I have reviewed Venita Lick LPN's note and agree with the documentation.  I personally performed a face-to-face visit, made revisions and my assessment and plan is as follows.    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  NLP Hodgkin's lymphoma (recurrent 3B): -6 cycles of R-CHOP from 05/24/2018 through 09/14/2018. -XRT to the right axillary region completed around November 2020. -Denies any B symptoms.  No night sweats. -We reviewed PET scan dated 03/21/2019 which showed right  axillary and subpectoral lymph nodes with the largest right axillary lymph node measuring 1.7 cm.  Deauville 2 activity indicating complete response.  No other new areas were seen. -We also reviewed his labs which are grossly within normal limits. -We will see him back in 3 months for follow-up with labs and physical exam. -We will plan to repeat CT CAP every 6 months for 2 years.  2.  Diabetes: -He is taking glipizide XR 5 mg daily. -Most recent hemoglobin A1c was 6.6.  3.  Post splenectomy state: -Received Menactra and Pneumovax on 05/25/2018.      Orders placed this encounter:  Orders Placed This Encounter  Procedures  . CBC with Differential/Platelet  . Comprehensive metabolic panel  . Sedimentation rate  . Lactate dehydrogenase      Derek Jack, MD [place] [phone]

## 2019-03-23 NOTE — Progress Notes (Signed)
Devin Tran presented for Portacath access and flush. Portacath located left chest wall accessed with  H 20 needle. No blood return and no resistance met Portacath flushed with 60m NS and 500U/527mHeparin and needle removed intact. Procedure without incident. Patient tolerated procedure well.

## 2019-03-23 NOTE — Patient Instructions (Addendum)
[facility] at [facility] Discharge Instructions  You were seen today by Dr. Katragadda. He went over your recent lab and scan results. He will see you back in 3 months for labs and follow up.   Thank you for choosing [facility] at [facility] to provide your oncology and hematology care.  To afford each patient quality time with our provider, please arrive at least 15 minutes before your scheduled appointment time.   If you have a lab appointment with the Cancer Center please come in thru the  Main Entrance and check in at the main information desk  You need to re-schedule your appointment should you arrive 10 or more minutes late.  We strive to give you quality time with our providers, and arriving late affects you and other patients whose appointments are after yours.  Also, if you no show three or more times for appointments you may be dismissed from the clinic at the providers discretion.     Again, thank you for choosing [facility].  Our hope is that these requests will decrease the amount of time that you wait before being seen by our physicians.       _____________________________________________________________  Should you have questions after your visit to [facility], please contact our office at [phone] between the hours of 8:00 a.m. and 4:30 p.m.  Voicemails left after 4:00 p.m. will not be returned until the following business day.  For prescription refill requests, have your pharmacy contact our office and allow 72 hours.    Cancer Center Support Programs:   > Cancer Support Group  2nd Tuesday of the month 1pm-2pm, Journey Room    

## 2019-03-23 NOTE — Assessment & Plan Note (Signed)
1.  NLP Hodgkin's lymphoma (recurrent 3B): -6 cycles of R-CHOP from 05/24/2018 through 09/14/2018. -XRT to the right axillary region completed around November 2020. -Denies any B symptoms.  No night sweats. -We reviewed PET scan dated 03/21/2019 which showed right axillary and subpectoral lymph nodes with the largest right axillary lymph node measuring 1.7 cm.  Deauville 2 activity indicating complete response.  No other new areas were seen. -We also reviewed his labs which are grossly within normal limits. -We will see him back in 3 months for follow-up with labs and physical exam. -We will plan to repeat CT CAP every 6 months for 2 years.  2.  Diabetes: -He is taking glipizide XR 5 mg daily. -Most recent hemoglobin A1c was 6.6.  3.  Post splenectomy state: -Received Menactra and Pneumovax on 05/25/2018.

## 2019-03-30 ENCOUNTER — Encounter (HOSPITAL_COMMUNITY): Payer: BC Managed Care – PPO

## 2019-04-16 ENCOUNTER — Ambulatory Visit: Payer: 59 | Attending: Internal Medicine

## 2019-04-16 DIAGNOSIS — Z23 Encounter for immunization: Secondary | ICD-10-CM

## 2019-04-16 NOTE — Progress Notes (Signed)
   Covid-19 Vaccination Clinic  Name:  GILSON WORKMAN    MRN: [id number] DOB: 02/20/1965  04/16/2019  Mr. Overmiller was observed post Covid-19 immunization for 15 minutes without incident. He was provided with Vaccine Information Sheet and instruction to access the V-Safe system.   Mr. Halpert was instructed to call 911 with any severe reactions post vaccine: Marland Kitchen Difficulty breathing  . Swelling of face and throat  . A fast heartbeat  . A bad rash all over body  . Dizziness and weakness   Immunizations Administered    Name Date Dose VIS Date Route   Moderna COVID-19 Vaccine 04/16/2019 12:15 PM 0.5 mL 01/04/2019 Intramuscular   Manufacturer: Moderna   Lot: [id number]   [id number]

## 2019-05-18 ENCOUNTER — Ambulatory Visit: Payer: 59 | Attending: Internal Medicine

## 2019-05-18 DIAGNOSIS — Z23 Encounter for immunization: Secondary | ICD-10-CM

## 2019-05-18 NOTE — Progress Notes (Signed)
   Covid-19 Vaccination Clinic  Name:  Devin Tran    MRN: [id number] DOB: 1965-12-14  05/18/2019  Mr. Pavlovich was observed post Covid-19 immunization for 30 minutes based on pre-vaccination screening without incident. He was provided with Vaccine Information Sheet and instruction to access the V-Safe system.   Mr. Hoard was instructed to call 911 with any severe reactions post vaccine: Marland Kitchen Difficulty breathing  . Swelling of face and throat  . A fast heartbeat  . A bad rash all over body  . Dizziness and weakness   Immunizations Administered    Name Date Dose VIS Date Route   Moderna COVID-19 Vaccine 05/18/2019 12:13 PM 0.5 mL 01/04/2019 Intramuscular   Manufacturer: Moderna   Lot: [id number]   Twin [id number]

## 2019-05-25 ENCOUNTER — Other Ambulatory Visit (HOSPITAL_COMMUNITY): Payer: Self-pay | Admitting: Hematology

## 2019-05-25 DIAGNOSIS — E119 Type 2 diabetes mellitus without complications: Secondary | ICD-10-CM

## 2019-06-20 ENCOUNTER — Inpatient Hospital Stay (HOSPITAL_COMMUNITY): Payer: 59

## 2019-06-20 ENCOUNTER — Inpatient Hospital Stay (HOSPITAL_COMMUNITY): Payer: 59 | Attending: Hematology | Admitting: Hematology

## 2019-06-20 ENCOUNTER — Other Ambulatory Visit: Payer: Self-pay

## 2019-06-20 VITALS — BP 136/89 | HR 76 | Temp 96.9°F | Resp 18 | Wt 245.0 lb

## 2019-06-20 DIAGNOSIS — E119 Type 2 diabetes mellitus without complications: Secondary | ICD-10-CM | POA: Diagnosis not present

## 2019-06-20 DIAGNOSIS — C8108 Nodular lymphocyte predominant Hodgkin lymphoma, lymph nodes of multiple sites: Secondary | ICD-10-CM | POA: Diagnosis not present

## 2019-06-20 DIAGNOSIS — Z9081 Acquired absence of spleen: Secondary | ICD-10-CM | POA: Insufficient documentation

## 2019-06-20 DIAGNOSIS — R739 Hyperglycemia, unspecified: Secondary | ICD-10-CM | POA: Diagnosis not present

## 2019-06-20 LAB — COMPREHENSIVE METABOLIC PANEL
ALT: 29 U/L (ref 0–44)
AST: 22 U/L (ref 15–41)
Albumin: 4.4 g/dL (ref 3.5–5.0)
Alkaline Phosphatase: 69 U/L (ref 38–126)
Anion gap: 9 (ref 5–15)
BUN: 17 mg/dL (ref 6–20)
CO2: 26 mmol/L (ref 22–32)
Calcium: 9 mg/dL (ref 8.9–10.3)
Chloride: 104 mmol/L (ref 98–111)
Creatinine, Ser: 1.09 mg/dL (ref 0.61–1.24)
GFR calc Af Amer: >60 mL/min (ref >60)
GFR calc non Af Amer: >60 mL/min (ref >60)
Glucose, Bld: 183 mg/dL — ABNORMAL HIGH (ref 70–99)
Potassium: 3.5 mmol/L (ref 3.5–5.1)
Sodium: 139 mmol/L (ref 135–145)
Total Bilirubin: 0.5 mg/dL (ref 0.3–1.2)
Total Protein: 7 g/dL (ref 6.5–8.1)

## 2019-06-20 LAB — CBC WITH DIFFERENTIAL/PLATELET
Abs Immature Granulocytes: 0 10*3/uL (ref 0.00–0.07)
Basophils Absolute: 0 10*3/uL (ref 0.0–0.1)
Basophils Relative: 1 %
Eosinophils Absolute: 0.1 10*3/uL (ref 0.0–0.5)
Eosinophils Relative: 4 %
HCT: 42.7 % (ref 39.0–52.0)
Hemoglobin: 13.7 g/dL (ref 13.0–17.0)
Immature Granulocytes: 0 %
Lymphocytes Relative: 36 %
Lymphs Abs: 1.2 10*3/uL (ref 0.7–4.0)
MCH: 27.5 pg (ref 26.0–34.0)
MCHC: 32.1 g/dL (ref 30.0–36.0)
MCV: 85.7 fL (ref 80.0–100.0)
Monocytes Absolute: 0.6 10*3/uL (ref 0.1–1.0)
Monocytes Relative: 17 %
Neutro Abs: 1.4 10*3/uL — ABNORMAL LOW (ref 1.7–7.7)
Neutrophils Relative %: 42 %
Platelets: 221 10*3/uL (ref 150–400)
RBC: 4.98 MIL/uL (ref 4.22–5.81)
RDW: 15.7 % — ABNORMAL HIGH (ref 11.5–15.5)
WBC: 3.3 10*3/uL — ABNORMAL LOW (ref 4.0–10.5)
nRBC: 0 % (ref 0.0–0.2)

## 2019-06-20 LAB — LACTATE DEHYDROGENASE: LDH: 180 U/L (ref 98–192)

## 2019-06-20 LAB — SEDIMENTATION RATE: Sed Rate: 2 mm/hr (ref 0–16)

## 2019-06-20 MED ORDER — HEPARIN SOD (PORK) LOCK FLUSH 100 UNIT/ML IV SOLN
500.0000 [IU] | Freq: Once | INTRAVENOUS | Status: AC
  Administered 2019-06-20: 500 [IU] via INTRAVENOUS

## 2019-06-20 MED ORDER — SODIUM CHLORIDE 0.9% FLUSH
10.0000 mL | INTRAVENOUS | Status: DC | PRN
  Administered 2019-06-20: 10 mL via INTRAVENOUS

## 2019-06-20 NOTE — Progress Notes (Signed)
Devin Tran tolerated portacath flush

## 2019-06-20 NOTE — Progress Notes (Signed)
Devin Tran tolerated portacath flush well without complaints or incident. VSS Pt discharged self ambulatory in satisfactory condition

## 2019-06-20 NOTE — Patient Instructions (Addendum)
[facility] at [facility] Discharge Instructions  You were seen today by Dr. Katragadda. He went over your recent lab results. He will see you back in 3 months for labs, scan and follow up.   Thank you for choosing [facility] at [facility] to provide your oncology and hematology care.  To afford each patient quality time with our provider, please arrive at least 15 minutes before your scheduled appointment time.   If you have a lab appointment with the Cancer Center please come in thru the  Main Entrance and check in at the main information desk  You need to re-schedule your appointment should you arrive 10 or more minutes late.  We strive to give you quality time with our providers, and arriving late affects you and other patients whose appointments are after yours.  Also, if you no show three or more times for appointments you may be dismissed from the clinic at the providers discretion.     Again, thank you for choosing  Cancer Center.  Our hope is that these requests will decrease the amount of time that you wait before being seen by our physicians.       _____________________________________________________________  Should you have questions after your visit to  Cancer Center, please contact our office at [phone] between the hours of 8:00 a.m. and 4:30 p.m.  Voicemails left after 4:00 p.m. will not be returned until the following business day.  For prescription refill requests, have your pharmacy contact our office and allow 72 hours.    Cancer Center Support Programs:   > Cancer Support Group  2nd Tuesday of the month 1pm-2pm, Journey Room    

## 2019-06-20 NOTE — Patient Instructions (Signed)
[facility] at [facility] Discharge Instructions  Portacath flushed per protocol today. Follow-up as scheduled. Call clinic for any questions or concerns   Thank you for choosing [facility] at [facility] to provide your oncology and hematology care.  To afford each patient quality time with our provider, please arrive at least 15 minutes before your scheduled appointment time.   If you have a lab appointment with the Cancer Center please come in thru the Main Entrance and check in at the main information desk.  You need to re-schedule your appointment should you arrive 10 or more minutes late.  We strive to give you quality time with our providers, and arriving late affects you and other patients whose appointments are after yours.  Also, if you no show three or more times for appointments you may be dismissed from the clinic at the providers discretion.     Again, thank you for choosing [facility].  Our hope is that these requests will decrease the amount of time that you wait before being seen by our physicians.       _____________________________________________________________  Should you have questions after your visit to  Cancer Center, please contact our office at [phone] between the hours of 8:00 a.m. and 4:30 p.m.  Voicemails left after 4:00 p.m. will not be returned until the following business day.  For prescription refill requests, have your pharmacy contact our office and allow 72 hours.    Due to Covid, you will need to wear a mask upon entering the hospital. If you do not have a mask, a mask will be given to you at the Main Entrance upon arrival. For doctor visits, patients may have 1 support person with them. For treatment visits, patients can not have anyone with them due to social distancing guidelines and our immunocompromised population.     

## 2019-06-20 NOTE — Progress Notes (Signed)
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 ([place])   06/01/2018 -  Chemotherapy   The patient had DOXOrubicin (ADRIAMYCIN) chemo injection 118 mg, 50 mg/m2 = 118 mg, Intravenous,  Once, 6 of 6 cycles Administration: 118 mg (06/01/2018), 118 mg (06/22/2018), 118 mg (07/13/2018), 118 mg (08/03/2018), 118 mg (08/24/2018), 118 mg (09/14/2018) palonosetron (ALOXI) injection 0.25 mg, 0.25 mg, Intravenous,  Once, 6 of 6 cycles Administration: 0.25 mg (06/01/2018), 0.25 mg (06/22/2018), 0.25 mg (07/13/2018), 0.25 mg (08/03/2018), 0.25 mg (08/24/2018), 0.25 mg (09/14/2018) pegfilgrastim (NEULASTA ONPRO KIT) injection 6 mg, 6 mg, Subcutaneous, Once, 6 of 6 cycles Administration: 6 mg (06/01/2018), 6 mg (06/22/2018), 6 mg (07/13/2018), 6 mg (08/03/2018), 6 mg (08/24/2018), 6 mg (09/14/2018) vinCRIStine (ONCOVIN) 2 mg in sodium chloride 0.9 % 50 mL chemo infusion, 2 mg, Intravenous,  Once, 6 of 6 cycles Administration: 2 mg (06/01/2018), 2 mg (06/22/2018), 2 mg (07/13/2018), 2 mg (08/03/2018), 2 mg (08/24/2018), 2 mg (09/14/2018) riTUXimab (RITUXAN) 900 mg in sodium chloride 0.9 % 250 mL (2.6471 mg/mL) infusion, 375 mg/m2 = 900 mg, Intravenous,  Once, 1 of 1 cycle Administration: 900 mg (06/01/2018) cyclophosphamide (CYTOXAN) 1,760 mg in sodium chloride 0.9 % 250 mL chemo infusion, 750 mg/m2 = 1,760 mg, Intravenous,  Once, 6 of 6 cycles Administration: 1,760 mg (06/01/2018), 1,760 mg (06/22/2018), 1,760 mg (07/13/2018), 1,760 mg (08/03/2018), 1,760 mg (08/24/2018), 1,760 mg (09/14/2018) riTUXimab (RITUXAN) 900 mg in sodium  chloride 0.9 % 160 mL infusion, 375 mg/m2 = 900 mg (100 % of original dose 375 mg/m2), Intravenous,  Once, 5 of 5 cycles Dose modification: 375 mg/m2 (original dose 375 mg/m2, Cycle 2) Administration: 900 mg (06/22/2018), 900 mg (07/13/2018), 900 mg (08/03/2018), 900 mg (08/24/2018), 900 mg (09/14/2018) dexrazoxane (ZINECARD) 1,180 mg in lactated ringers 300 mL IVPB, 500 mg/m2 = 1,180 mg (100 % of original dose 500 mg/m2), Intravenous,  Once, 3 of 3 cycles Dose modification: 500 mg/m2 (original dose 500 mg/m2, Cycle 4), 500 mg/m2 (original dose 500 mg/m2, Cycle 4) Administration: 1,180 mg (08/24/2018), 1,180 mg (08/03/2018), 1,180 mg (09/14/2018)  for chemotherapy treatment.       CANCER STAGING: Cancer Staging No matching staging information was found for the patient.   INTERVAL HISTORY:  Mr. Sevillano 54 y.o. male seen for follow-up of NLP Hodgkin's lymphoma.  Denies any fevers, night sweats or weight loss in the last 3 months.  He is continuing to work full-time job.  Appetite and energy levels are 100%.  He felt weak for a few days after the last COVID-19 vaccine.  Denies any new onset pains.  No tingling or numbness in the extremities reported.  REVIEW OF SYSTEMS:  Review of Systems  All other systems reviewed and are negative.    PAST MEDICAL/SURGICAL HISTORY:  Past Medical History:  Diagnosis Date  . Complication of anesthesia   . Erectile dysfunction   . Hodgkin's lymphoma ([place])   . Hyperlipidemia   . Hypertension   . Infertility   . Low testosterone   . PONV (postoperative nausea and vomiting)    Past Surgical History:  Procedure Laterality  Date  . AXILLARY LYMPH NODE BIOPSY Right 05/10/2018   Procedure: AXILLARY LYMPH NODE BIOPSY, RIGHT;  Surgeon: Aviva Signs, MD;  Location: [facility];  Service: General;  Laterality: Right;  . IR LYMPHANGIOGRAM EXTREMITY UNI LEFT  03/02/1987  . PORTA CATH INSERTION    . PORTA CATH REMOVAL    . PORTACATH PLACEMENT Left 05/21/2018   Procedure:  INSERTION PORT-A-CATH (attached catheter in left subclavian);  Surgeon: Aviva Signs, MD;  Location: [facility];  Service: General;  Laterality: Left;  . SPLENECTOMY Left Over 20 years ago   Due to Hodgkin's lymphoma     SOCIAL HISTORY:  Social History   Socioeconomic History  . Marital status: Single    Spouse name: Not on file  . Number of children: Not on file  . Years of education: Not on file  . Highest education level: Not on file  Occupational History  . Occupation: Drive Chief Operating Officer: [employer]  Tobacco Use  . Smoking status: Never Smoker  . Smokeless tobacco: Never Used  Substance and Sexual Activity  . Alcohol use: No  . Drug use: No  . Sexual activity: Yes  Other Topics Concern  . Not on file  Social History Narrative  . Not on file   Social Determinants of Health   Financial Resource Strain:   . Difficulty of Paying Living Expenses:   Food Insecurity:   . Worried About Charity fundraiser in the Last Year:   . Arboriculturist in the Last Year:   Transportation Needs:   . Film/video editor (Medical):   Marland Kitchen Lack of Transportation (Non-Medical):   Physical Activity:   . Days of Exercise per Week:   . Minutes of Exercise per Session:   Stress:   . Feeling of Stress :   Social Connections:   . Frequency of Communication with Friends and Family:   . Frequency of Social Gatherings with Friends and Family:   . Attends Religious Services:   . Active Member of Clubs or Organizations:   . Attends Archivist Meetings:   Marland Kitchen Marital Status:   Intimate Partner Violence:   . Fear of Current or Ex-Partner:   . Emotionally Abused:   Marland Kitchen Physically Abused:   . Sexually Abused:     FAMILY HISTORY:  Family History  Problem Relation Age of Onset  . Hypertension Mother   . Cancer Father        lung  . Hypertension Father   . Diabetes Father   . COPD Father   . Hypertension Brother   . Stroke Brother     CURRENT MEDICATIONS:    Outpatient Encounter Medications as of 06/20/2019  Medication Sig  . amLODipine (NORVASC) 10 MG tablet TAKE 1 TABLET BY MOUTH EVERY DAY  . Ascorbic Acid (VITAMIN C) 1000 MG tablet Take 1,000 mg by mouth daily. Vitamin c lozenge  . aspirin EC 81 MG tablet Take 81 mg by mouth daily.   Marland Kitchen glipiZIDE (GLUCOTROL XL) 5 MG 24 hr tablet TAKE 1 TABLET BY MOUTH EVERY DAY WITH BREAKFAST  . Multiple Vitamins-Minerals (ONE-A-DAY MENS HEALTH FORMULA PO) Take 1 tablet by mouth daily.   Marland Kitchen lidocaine-prilocaine (EMLA) cream Apply pea-sized amount to port a cath site and cover with plastic wrap one hour prior to each chemotherapy appointment (Patient not taking: Reported on 10/06/2018)   No facility-administered encounter medications on file as of 06/20/2019.    ALLERGIES:  Allergies  Allergen  Reactions  . Bleomycin Other (See Comments)    Cramping all over     PHYSICAL EXAM:  ECOG Performance status: 1  Vitals:   06/20/19 1537  BP: 136/89  Pulse: 76  Resp: 18  Temp: (!) 96.9 F (36.1 C)  SpO2: 99%   Filed Weights   06/20/19 1537  Weight: 245 lb (111.1 kg)    Physical Exam Vitals reviewed.  Constitutional:      Appearance: Normal appearance.  Cardiovascular:     Rate and Rhythm: Normal rate and regular rhythm.     Heart sounds: Normal heart sounds.  Pulmonary:     Effort: Pulmonary effort is normal.     Breath sounds: Normal breath sounds.  Abdominal:     General: There is no distension.     Palpations: Abdomen is soft. There is no mass.  Musculoskeletal:        General: No swelling.  Lymphadenopathy:     Cervical: No cervical adenopathy.  Skin:    General: Skin is warm.  Neurological:     General: No focal deficit present.     Mental Status: He is alert and oriented to person, place, and time.  Psychiatric:        Mood and Affect: Mood normal.        Behavior: Behavior normal.   Stable right axillary adenopathy.    LABORATORY DATA:  I have reviewed the labs as listed.   CBC    Component Value Date/Time   WBC 3.3 (L) 06/20/2019 1440   RBC 4.98 06/20/2019 1440   HGB 13.7 06/20/2019 1440   HGB 13.6 02/10/2019 1634   HCT 42.7 06/20/2019 1440   HCT 42.0 02/10/2019 1634   PLT 221 06/20/2019 1440   PLT 275 02/10/2019 1634   MCV 85.7 06/20/2019 1440   MCV 83 02/10/2019 1634   MCH 27.5 06/20/2019 1440   MCHC 32.1 06/20/2019 1440   RDW 15.7 (H) 06/20/2019 1440   RDW 15.2 02/10/2019 1634   LYMPHSABS 1.2 06/20/2019 1440   LYMPHSABS 1.8 02/10/2019 1634   MONOABS 0.6 06/20/2019 1440   EOSABS 0.1 06/20/2019 1440   EOSABS 0.1 02/10/2019 1634   BASOSABS 0.0 06/20/2019 1440   BASOSABS 0.0 02/10/2019 1634   CMP Latest Ref Rng & Units 06/20/2019 03/21/2019 02/10/2019  Glucose 70 - 99 mg/dL 183(H) 92 79  BUN 6 - 20 mg/dL '17 17 17  ' Creatinine 0.61 - 1.24 mg/dL 1.09 1.09 1.11  Sodium 135 - 145 mmol/L 139 143 144  Potassium 3.5 - 5.1 mmol/L 3.5 3.7 4.2  Chloride 98 - 111 mmol/L 104 106 106  CO2 22 - 32 mmol/L '26 26 25  ' Calcium 8.9 - 10.3 mg/dL 9.0 9.5 9.8  Total Protein 6.5 - 8.1 g/dL 7.0 7.6 7.0  Total Bilirubin 0.3 - 1.2 mg/dL 0.5 0.8 0.3  Alkaline Phos 38 - 126 U/L 69 71 82  AST 15 - 41 U/L '22 22 19  ' ALT 0 - 44 U/L '29 28 27       ' DIAGNOSTIC IMAGING:  I reviewed the scans.   I have reviewed Venita Lick LPN's note and agree with the documentation.  I personally performed a face-to-face visit, made revisions and my assessment and plan is as follows.    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  NLP Hodgkin's lymphoma, recurrent 3B: -6 cycles of R-CHOP from 05/24/2018 through 09/14/2018. -XRT to the right axillary lymph node completed  around November 2020. -PET scan on 03/21/2019 showed right axillary and subpectoral lymph nodes with the largest right axillary lymph node measuring 1.7 cm, [place] 2. -Physical exam today showed stable palpable right axillary lymph node. -We reviewed his blood  work which is grossly within normal limits.  He had leukopenia which is intermittent with no symptoms.  Sed rate and LDH was normal. -I plan to see him back in 3 months for follow-up.  I plan to repeat CT CAP prior to next visit.  We will plan to do CT CAP every 6 months for 2 years.  2.  Diabetes: -He is taking glipizide XR 5 mg daily. -We will check hemoglobin A1c next visit.  3.  Post splenectomy state: -Received Menactra and Pneumovax on 05/25/2018.     Orders placed this encounter:  Orders Placed This Encounter  Procedures  . CT Abdomen Pelvis W Contrast  . CT Chest W Contrast  . CBC with Differential/Platelet  . Comprehensive metabolic panel  . Magnesium  . Lactate dehydrogenase  . Hemoglobin A1c      Derek Jack, MD [place] [phone]

## 2019-06-20 NOTE — Assessment & Plan Note (Addendum)
1.  NLP Hodgkin's lymphoma, recurrent 3B: -6 cycles of R-CHOP from 05/24/2018 through 09/14/2018. -XRT to the right axillary lymph node completed around November 2020. -PET scan on 03/21/2019 showed right axillary and subpectoral lymph nodes with the largest right axillary lymph node measuring 1.7 cm, [place] 2. -Physical exam today showed stable palpable right axillary lymph node. -We reviewed his blood work which is grossly within normal limits.  He had leukopenia which is intermittent with no symptoms.  Sed rate and LDH was normal. -I plan to see him back in 3 months for follow-up.  I plan to repeat CT CAP prior to next visit.  We will plan to do CT CAP every 6 months for 2 years.  2.  Diabetes: -He is taking glipizide XR 5 mg daily. -We will check hemoglobin A1c next visit.  3.  Post splenectomy state: -Received Menactra and Pneumovax on 05/25/2018.

## 2019-08-12 ENCOUNTER — Telehealth: Payer: Self-pay | Admitting: Family Medicine

## 2019-08-12 NOTE — Telephone Encounter (Signed)
He will try OTC allergy medicine, tylenol for low grade fever.  Could be viral and should treat symptoms for relief.  Had covid immunization in April.  If fever goes up and symptoms get worse,  seek care.

## 2019-08-14 ENCOUNTER — Emergency Department (HOSPITAL_COMMUNITY): Payer: 59

## 2019-08-14 ENCOUNTER — Other Ambulatory Visit: Payer: Self-pay

## 2019-08-14 ENCOUNTER — Emergency Department (HOSPITAL_COMMUNITY)
Admission: EM | Admit: 2019-08-14 | Discharge: 2019-08-14 | Disposition: A | Discharge status: Home / Self Care | Payer: 59 | Attending: Emergency Medicine | Admitting: Emergency Medicine

## 2019-08-14 ENCOUNTER — Encounter (HOSPITAL_COMMUNITY): Payer: Self-pay | Admitting: Emergency Medicine

## 2019-08-14 DIAGNOSIS — I10 Essential (primary) hypertension: Secondary | ICD-10-CM | POA: Insufficient documentation

## 2019-08-14 DIAGNOSIS — Z79899 Other long term (current) drug therapy: Secondary | ICD-10-CM | POA: Insufficient documentation

## 2019-08-14 DIAGNOSIS — R509 Fever, unspecified: Secondary | ICD-10-CM

## 2019-08-14 DIAGNOSIS — Z7982 Long term (current) use of aspirin: Secondary | ICD-10-CM | POA: Insufficient documentation

## 2019-08-14 DIAGNOSIS — U071 COVID-19: Secondary | ICD-10-CM | POA: Diagnosis not present

## 2019-08-14 LAB — COMPREHENSIVE METABOLIC PANEL
ALT: 32 U/L (ref 0–44)
AST: 23 U/L (ref 15–41)
Albumin: 4.5 g/dL (ref 3.5–5.0)
Alkaline Phosphatase: 63 U/L (ref 38–126)
Anion gap: 10 (ref 5–15)
BUN: 17 mg/dL (ref 6–20)
CO2: 23 mmol/L (ref 22–32)
Calcium: 9.1 mg/dL (ref 8.9–10.3)
Chloride: 103 mmol/L (ref 98–111)
Creatinine, Ser: 1.25 mg/dL — ABNORMAL HIGH (ref 0.61–1.24)
GFR calc Af Amer: >60 mL/min (ref >60)
GFR calc non Af Amer: >60 mL/min (ref >60)
Glucose, Bld: 107 mg/dL — ABNORMAL HIGH (ref 70–99)
Potassium: 3.5 mmol/L (ref 3.5–5.1)
Sodium: 136 mmol/L (ref 135–145)
Total Bilirubin: 0.6 mg/dL (ref 0.3–1.2)
Total Protein: 7.5 g/dL (ref 6.5–8.1)

## 2019-08-14 LAB — CBC WITH DIFFERENTIAL/PLATELET
Abs Immature Granulocytes: 0.01 10*3/uL (ref 0.00–0.07)
Basophils Absolute: 0 10*3/uL (ref 0.0–0.1)
Basophils Relative: 1 %
Eosinophils Absolute: 0 10*3/uL (ref 0.0–0.5)
Eosinophils Relative: 0 %
HCT: 47.4 % (ref 39.0–52.0)
Hemoglobin: 15.3 g/dL (ref 13.0–17.0)
Immature Granulocytes: 0 %
Lymphocytes Relative: 22 %
Lymphs Abs: 1.2 10*3/uL (ref 0.7–4.0)
MCH: 27.7 pg (ref 26.0–34.0)
MCHC: 32.3 g/dL (ref 30.0–36.0)
MCV: 85.7 fL (ref 80.0–100.0)
Monocytes Absolute: 0.8 10*3/uL (ref 0.1–1.0)
Monocytes Relative: 14 %
Neutro Abs: 3.6 10*3/uL (ref 1.7–7.7)
Neutrophils Relative %: 63 %
Platelets: 205 10*3/uL (ref 150–400)
RBC: 5.53 MIL/uL (ref 4.22–5.81)
RDW: 15.6 % — ABNORMAL HIGH (ref 11.5–15.5)
WBC: 5.7 10*3/uL (ref 4.0–10.5)
nRBC: 0 % (ref 0.0–0.2)

## 2019-08-14 LAB — URINALYSIS, ROUTINE W REFLEX MICROSCOPIC
Bilirubin Urine: NEGATIVE
Glucose, UA: NEGATIVE mg/dL
Hgb urine dipstick: NEGATIVE
Ketones, ur: NEGATIVE mg/dL
Leukocytes,Ua: NEGATIVE
Nitrite: NEGATIVE
Protein, ur: NEGATIVE mg/dL
Specific Gravity, Urine: 1.012 (ref 1.005–1.030)
pH: 6 (ref 5.0–8.0)

## 2019-08-14 LAB — LACTIC ACID, PLASMA: Lactic Acid, Venous: 1 mmol/L (ref 0.5–1.9)

## 2019-08-14 LAB — SARS CORONAVIRUS 2 BY RT PCR (HOSPITAL ORDER, PERFORMED IN ~~LOC~~ HOSPITAL LAB): SARS Coronavirus 2: POSITIVE — AB

## 2019-08-14 MED ORDER — ACETAMINOPHEN 325 MG PO TABS
650.0000 mg | ORAL_TABLET | Freq: Once | ORAL | Status: AC | PRN
  Administered 2019-08-14: 650 mg via ORAL
  Filled 2019-08-14: qty 2

## 2019-08-14 MED ORDER — SODIUM CHLORIDE 0.9 % IV SOLN: INTRAVENOUS | Status: DC

## 2019-08-14 MED ORDER — SODIUM CHLORIDE 0.9 % IV BOLUS
1000.0000 mL | Freq: Once | INTRAVENOUS | Status: AC
  Administered 2019-08-14: 1000 mL via INTRAVENOUS

## 2019-08-14 NOTE — Discharge Instructions (Addendum)
Covid test was positive.  Rest of work-up negative.  Return for any new or worse symptoms return for any trouble breathing.  Would recommend taking supplements of vitamin D and zinc as well as a multivitamin.  Work note provided to be out of work.

## 2019-08-14 NOTE — ED Provider Notes (Signed)
[facility] EMERGENCY DEPARTMENT Provider Note   CSN: [id number] Arrival date & time: 08/14/19  1400     History Chief Complaint  Patient presents with  . Fever    Devin Tran is a 54 y.o. male.  Patient with onset of symptoms on Thursday.  Temperature in triage was 103.1.  Patient currently followed by hematology oncology for Hodgkin's lymphoma.  But not receiving any current treatment.  Patient had both Covid vaccines.  Completed them in April.  Patient is has some nasal congestion no headache no neck pain no cough no shortness of breath no chest pain.  No nausea vomiting or diarrhea.  Oxygen saturations on presentation room air was 97%.  Blood pressure 409 systolic heart rate 735.  Respirations 18.  Patient has had his spleen removed.        Past Medical History:  Diagnosis Date  . Complication of anesthesia   . Erectile dysfunction   . Hodgkin's lymphoma ([place])   . Hyperlipidemia   . Hypertension   . Infertility   . Low testosterone   . PONV (postoperative nausea and vomiting)     Patient Active Problem List   Diagnosis Date Noted  . 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 Lymphadenopathy 05/06/2018  . Hx of Hodgkins lymphoma 05/03/2018  . Hypogonadism male 05/20/2013  . Hypertension   . Erectile dysfunction   . Hyperlipidemia   . Infertility   . 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 AXILLARY LYMPH NODE BIOPSY Right 05/10/2018   Procedure: AXILLARY LYMPH NODE BIOPSY, RIGHT;  Surgeon: Aviva Signs, MD;  Location: [facility];  Service: General;  Laterality: Right;  . IR LYMPHANGIOGRAM EXTREMITY UNI LEFT  03/02/1987  . PORTA CATH INSERTION    . PORTA CATH REMOVAL    . PORTACATH PLACEMENT Left 05/21/2018   Procedure: INSERTION PORT-A-CATH (attached catheter in left subclavian);  Surgeon: Aviva Signs, MD;  Location: [facility];  Service: General;  Laterality: Left;  . SPLENECTOMY Left  Over 20 years ago   Due to Hodgkin's lymphoma       Family History  Problem Relation Age of Onset  . Hypertension Mother   . Cancer Father        lung  . Hypertension Father   . Diabetes Father   . COPD Father   . Hypertension Brother   . Stroke Brother     Social History   Tobacco Use  . Smoking status: Never Smoker  . Smokeless tobacco: Never Used  Vaping Use  . Vaping Use: Never assessed  Substance Use Topics  . Alcohol use: No  . Drug use: No    Home Medications Prior to Admission medications   Medication Sig Start Date End Date Taking? Authorizing Provider  amLODipine (NORVASC) 10 MG tablet TAKE 1 TABLET BY MOUTH EVERY DAY 02/10/19   [name], MD  Ascorbic Acid (VITAMIN C) 1000 MG tablet Take 1,000 mg by mouth daily. Vitamin c lozenge    [provider]  aspirin EC 81 MG tablet Take 81 mg by mouth daily.     [provider]  glipiZIDE (GLUCOTROL XL) 5 MG 24 hr tablet TAKE 1 TABLET BY MOUTH EVERY DAY WITH BREAKFAST 05/25/19   Lockamy, Randi L, NP-C  lidocaine-prilocaine (EMLA) cream Apply pea-sized amount to port a cath site and cover with plastic wrap one hour prior to each chemotherapy appointment Patient not taking: Reported on  10/06/2018 05/27/18   Derek Jack, MD  Multiple Vitamins-Minerals (ONE-A-DAY MENS HEALTH FORMULA PO) Take 1 tablet by mouth daily.     [provider]    Allergies    Bleomycin  Review of Systems   Review of Systems  Constitutional: Positive for chills and fever.  HENT: Positive for congestion. Negative for rhinorrhea and sore throat.   Eyes: Negative for visual disturbance.  Respiratory: Negative for cough and shortness of breath.   Cardiovascular: Negative for chest pain and leg swelling.  Gastrointestinal: Negative for abdominal pain, diarrhea, nausea and vomiting.  Genitourinary: Negative for dysuria.  Musculoskeletal: Negative for back pain and neck pain.  Skin: Negative for rash.    Neurological: Negative for dizziness, light-headedness and headaches.  Hematological: Does not bruise/bleed easily.  Psychiatric/Behavioral: Negative for confusion.    Physical Exam Updated Vital Signs BP (!) 139/92   Pulse 87   Temp 98.9 F (37.2 C) (Temporal)   Resp (!) 28   Ht 1.854 m (6\' 1" )   Wt 106.6 kg   SpO2 98%   BMI 31.00 kg/m   Physical Exam Vitals and nursing note reviewed.  Constitutional:      Appearance: Normal appearance. He is well-developed.  HENT:     Head: Normocephalic and atraumatic.  Eyes:     Extraocular Movements: Extraocular movements intact.     Conjunctiva/sclera: Conjunctivae normal.     Pupils: Pupils are equal, round, and reactive to light.  Cardiovascular:     Rate and Rhythm: Normal rate and regular rhythm.     Heart sounds: No murmur heard.   Pulmonary:     Effort: Pulmonary effort is normal. No respiratory distress.     Breath sounds: Normal breath sounds.  Abdominal:     Palpations: Abdomen is soft.     Tenderness: There is no abdominal tenderness.  Musculoskeletal:        General: No swelling. Normal range of motion.     Cervical back: Normal range of motion and neck supple. No rigidity.  Skin:    General: Skin is warm and dry.  Neurological:     General: No focal deficit present.     Mental Status: He is alert and oriented to person, place, and time.     Cranial Nerves: No cranial nerve deficit.     Sensory: No sensory deficit.     Motor: No weakness.     ED Results / Procedures / Treatments   Labs (all labs ordered are listed, but only abnormal results are displayed) Labs Reviewed  SARS CORONAVIRUS 2 BY RT PCR ([facility]) - Abnormal; Notable for the following components:      Result Value   SARS Coronavirus 2 POSITIVE (*)    All other components within normal limits  CBC WITH DIFFERENTIAL/PLATELET - Abnormal; Notable for the following components:   RDW 15.6 (*)    All  other components within normal limits  COMPREHENSIVE METABOLIC PANEL - Abnormal; Notable for the following components:   Glucose, Bld 107 (*)    Creatinine, Ser 1.25 (*)    All other components within normal limits  CULTURE, BLOOD (ROUTINE X 2)  CULTURE, BLOOD (ROUTINE X 2)  LACTIC ACID, PLASMA  URINALYSIS, ROUTINE W REFLEX MICROSCOPIC    EKG EKG Interpretation  Date/Time:  Sunday August 14 2019 16:00:54 EDT Ventricular Rate:  92 PR Interval:    QRS Duration: 89 QT Interval:  349 QTC Calculation: 432 R Axis:  57 Text Interpretation: Sinus rhythm Borderline low voltage, extremity leads No previous ECGs available Confirmed by Fredia Sorrow [phone]) on 08/14/2019 5:37:52 PM   Radiology DG Chest Port 1 View  Result Date: 08/14/2019 CLINICAL DATA:  Fever and malaise.  History of Hodgkin's lymphoma EXAM: PORTABLE CHEST 1 VIEW COMPARISON:  September 04, 2018 FINDINGS: Lungs are clear. Heart size and pulmonary vascularity are normal. Port-A-Cath tip is at the junction of the left innominate vein and superior vena cava. No adenopathy appreciable. Surgical clips over left apex and in left upper abdomen medially. IMPRESSION: Lungs clear. No adenopathy. Port-A-Cath position unchanged. Stable cardiac silhouette. Electronically Signed   By: Lowella Grip III M.D.   On: 08/14/2019 15:37    Procedures Procedures (including critical care time)  Medications Ordered in ED Medications  0.9 %  sodium chloride infusion ( Intravenous New Bag/Given 08/14/19 1638)  acetaminophen (TYLENOL) tablet 650 mg (650 mg Oral Given 08/14/19 1458)  sodium chloride 0.9 % bolus 1,000 mL (0 mLs Intravenous Stopped 08/14/19 1835)    ED Course  I have reviewed the triage vital signs and the nursing notes.  Pertinent labs & imaging results that were available during my care of the patient were reviewed by me and considered in my medical decision making (see chart for details).    MDM Rules/Calculators/A&P                            Work-up without any acute findings normal white blood cell count.  Temperature did come down with Tylenol to 98.9.  Chest x-ray negative.  Electrolytes without any significant abnormalities.  Urinalysis negative for urinary tract infection.  Patient's Covid test was positive despite having both vaccines.  This probably the cause of the fever.  Patient without hypoxia.  Heart rate now down into the 90s.  Patient does not feel short of breath.  In no distress.  No criteria for admission.  Patient will be discharged home with precautions.  Work note provided.   Final Clinical Impression(s) / ED Diagnoses Final diagnoses:  COVID-19 virus infection    Rx / DC Orders ED Discharge Orders    None       Fredia Sorrow, MD 08/14/19 [place]

## 2019-08-14 NOTE — ED Notes (Signed)
Pt has had a spleenectomy

## 2019-08-14 NOTE — ED Notes (Signed)
Dr Earnest Conroy in to discuss

## 2019-08-14 NOTE — ED Notes (Signed)
Fever and nasal drainage since Thrusday   Called Dr Livia Snellen office and told probably sinus infection and to take OTC meds  Here with fever and malaise Has had both Covid vaccines (moderna)  SO here on acute with similar sx  Here for eval

## 2019-08-14 NOTE — ED Triage Notes (Signed)
Pt reports fever and nasal drainage since Thursday, temp in triage 103.1

## 2019-08-15 ENCOUNTER — Telehealth: Payer: Self-pay | Admitting: Adult Health

## 2019-08-15 NOTE — Telephone Encounter (Signed)
Called patient to discuss monoclonal antibody therapy for his covid19 positivity as he is in at risk group for complications and or hospitalization from the virus.  This was the second attempt.  His my chart verifies he read the information sent about treatment.    Call back number given: [phone]  Message forwarded to his PCP and oncologist.  [name], NP

## 2019-08-15 NOTE — Telephone Encounter (Signed)
Patient tested positive for COVID 19 in the ER on 08/14/2019.  I called him today as he is in an at risk population of patients at increased risk for complications and or hospitalization from the virus due to: BMI greater than 21, history of lymphoma, history of chemotherapy, splenectomy, diabetes, and HTN.    Devin Tran meets criteria to receive monoclonal antibody therapy for patients with mild to moderate COVID 19 in order to decrease his risk for hospitalization and/or complications.  I was unable to reach [name], but left a message on his phone, asking him to call us at [phone] so we can discuss further.   My chart message also sent.  [name], NP

## 2019-08-17 NOTE — Telephone Encounter (Signed)
I will.

## 2019-08-19 LAB — CULTURE, BLOOD (ROUTINE X 2)
Culture: NO GROWTH
Culture: NO GROWTH
Special Requests: ADEQUATE
Special Requests: ADEQUATE

## 2019-08-21 ENCOUNTER — Other Ambulatory Visit: Payer: Self-pay | Admitting: Family Medicine

## 2019-09-14 ENCOUNTER — Other Ambulatory Visit: Payer: Self-pay | Admitting: Family Medicine

## 2019-09-14 NOTE — Telephone Encounter (Signed)
Stacks. NTBS 30 days given 08/22/19

## 2019-09-19 ENCOUNTER — Encounter (HOSPITAL_COMMUNITY): Payer: 59

## 2019-09-19 ENCOUNTER — Telehealth (HOSPITAL_COMMUNITY): Payer: Self-pay | Admitting: *Deleted

## 2019-09-19 ENCOUNTER — Inpatient Hospital Stay (HOSPITAL_COMMUNITY): Payer: 59 | Attending: Hematology

## 2019-09-19 ENCOUNTER — Ambulatory Visit (HOSPITAL_COMMUNITY)
Admission: RE | Admit: 2019-09-19 | Discharge: 2019-09-19 | Disposition: A | Discharge status: Home / Self Care | Payer: 59 | Source: Ambulatory Visit | Attending: Hematology | Admitting: Hematology

## 2019-09-19 ENCOUNTER — Other Ambulatory Visit: Payer: Self-pay

## 2019-09-19 DIAGNOSIS — C8108 Nodular lymphocyte predominant Hodgkin lymphoma, lymph nodes of multiple sites: Secondary | ICD-10-CM | POA: Diagnosis present

## 2019-09-19 DIAGNOSIS — R739 Hyperglycemia, unspecified: Secondary | ICD-10-CM

## 2019-09-19 LAB — CBC WITH DIFFERENTIAL/PLATELET
Abs Immature Granulocytes: 0.01 10*3/uL (ref 0.00–0.07)
Basophils Absolute: 0 10*3/uL (ref 0.0–0.1)
Basophils Relative: 1 %
Eosinophils Absolute: 0.1 10*3/uL (ref 0.0–0.5)
Eosinophils Relative: 3 %
HCT: 45.1 % (ref 39.0–52.0)
Hemoglobin: 14.6 g/dL (ref 13.0–17.0)
Immature Granulocytes: 0 %
Lymphocytes Relative: 44 %
Lymphs Abs: 1.7 10*3/uL (ref 0.7–4.0)
MCH: 27.3 pg (ref 26.0–34.0)
MCHC: 32.4 g/dL (ref 30.0–36.0)
MCV: 84.3 fL (ref 80.0–100.0)
Monocytes Absolute: 0.7 10*3/uL (ref 0.1–1.0)
Monocytes Relative: 17 %
Neutro Abs: 1.4 10*3/uL — ABNORMAL LOW (ref 1.7–7.7)
Neutrophils Relative %: 35 %
Platelets: 268 10*3/uL (ref 150–400)
RBC: 5.35 MIL/uL (ref 4.22–5.81)
RDW: 15.3 % (ref 11.5–15.5)
WBC: 4 10*3/uL (ref 4.0–10.5)
nRBC: 0 % (ref 0.0–0.2)

## 2019-09-19 LAB — COMPREHENSIVE METABOLIC PANEL
ALT: 33 U/L (ref 0–44)
AST: 22 U/L (ref 15–41)
Albumin: 4.4 g/dL (ref 3.5–5.0)
Alkaline Phosphatase: 68 U/L (ref 38–126)
Anion gap: 9 (ref 5–15)
BUN: 15 mg/dL (ref 6–20)
CO2: 24 mmol/L (ref 22–32)
Calcium: 9.2 mg/dL (ref 8.9–10.3)
Chloride: 107 mmol/L (ref 98–111)
Creatinine, Ser: 1.02 mg/dL (ref 0.61–1.24)
GFR calc Af Amer: >60 mL/min (ref >60)
GFR calc non Af Amer: >60 mL/min (ref >60)
Glucose, Bld: 177 mg/dL — ABNORMAL HIGH (ref 70–99)
Potassium: 3.7 mmol/L (ref 3.5–5.1)
Sodium: 140 mmol/L (ref 135–145)
Total Bilirubin: 0.4 mg/dL (ref 0.3–1.2)
Total Protein: 7.5 g/dL (ref 6.5–8.1)

## 2019-09-19 LAB — HEMOGLOBIN A1C
Hgb A1c MFr Bld: 7.3 % — ABNORMAL HIGH (ref 4.8–5.6)
Mean Plasma Glucose: 162.81 mg/dL

## 2019-09-19 LAB — LACTATE DEHYDROGENASE: LDH: 169 U/L (ref 98–192)

## 2019-09-19 LAB — MAGNESIUM: Magnesium: 2 mg/dL (ref 1.7–2.4)

## 2019-09-19 MED ORDER — IOHEXOL 300 MG/ML  SOLN
100.0000 mL | Freq: Once | INTRAMUSCULAR | Status: AC | PRN
  Administered 2019-09-19: 100 mL via INTRAVENOUS

## 2019-09-19 NOTE — Telephone Encounter (Signed)
Pt had CT done earlier today, results were called to [place]. Dr. Delton Coombes reviewed the results and had this nurse call the pt to see if he was currently having any symptoms. The pt denies any cough, fever or shortness of breath. He states that he did have COVID 19 about a month and a half ago. I advised the pt that I would discuss this with Dr. Delton Coombes and call him back with any instructions. The pt verbalized understanding.

## 2019-09-19 NOTE — Telephone Encounter (Signed)
Patient aware and appt made 

## 2019-09-22 ENCOUNTER — Other Ambulatory Visit: Payer: Self-pay

## 2019-09-22 ENCOUNTER — Inpatient Hospital Stay (HOSPITAL_BASED_OUTPATIENT_CLINIC_OR_DEPARTMENT_OTHER): Payer: 59 | Admitting: Hematology

## 2019-09-22 ENCOUNTER — Encounter: Payer: Self-pay | Admitting: Family Medicine

## 2019-09-22 ENCOUNTER — Inpatient Hospital Stay (HOSPITAL_COMMUNITY): Payer: 59 | Attending: Hematology

## 2019-09-22 ENCOUNTER — Ambulatory Visit (INDEPENDENT_AMBULATORY_CARE_PROVIDER_SITE_OTHER): Payer: 59 | Admitting: Family Medicine

## 2019-09-22 VITALS — HR 95 | Temp 97.3°F | Resp 18

## 2019-09-22 VITALS — BP 140/83 | Wt 240.2 lb

## 2019-09-22 DIAGNOSIS — C8108 Nodular lymphocyte predominant Hodgkin lymphoma, lymph nodes of multiple sites: Secondary | ICD-10-CM | POA: Diagnosis present

## 2019-09-22 DIAGNOSIS — Z79899 Other long term (current) drug therapy: Secondary | ICD-10-CM | POA: Insufficient documentation

## 2019-09-22 DIAGNOSIS — E119 Type 2 diabetes mellitus without complications: Secondary | ICD-10-CM | POA: Diagnosis not present

## 2019-09-22 DIAGNOSIS — Z452 Encounter for adjustment and management of vascular access device: Secondary | ICD-10-CM | POA: Insufficient documentation

## 2019-09-22 DIAGNOSIS — Z7984 Long term (current) use of oral hypoglycemic drugs: Secondary | ICD-10-CM | POA: Diagnosis not present

## 2019-09-22 DIAGNOSIS — N529 Male erectile dysfunction, unspecified: Secondary | ICD-10-CM | POA: Diagnosis not present

## 2019-09-22 DIAGNOSIS — E785 Hyperlipidemia, unspecified: Secondary | ICD-10-CM | POA: Insufficient documentation

## 2019-09-22 DIAGNOSIS — Z9081 Acquired absence of spleen: Secondary | ICD-10-CM | POA: Insufficient documentation

## 2019-09-22 DIAGNOSIS — I10 Essential (primary) hypertension: Secondary | ICD-10-CM

## 2019-09-22 DIAGNOSIS — N281 Cyst of kidney, acquired: Secondary | ICD-10-CM | POA: Insufficient documentation

## 2019-09-22 DIAGNOSIS — I700 Atherosclerosis of aorta: Secondary | ICD-10-CM | POA: Diagnosis not present

## 2019-09-22 DIAGNOSIS — Z7982 Long term (current) use of aspirin: Secondary | ICD-10-CM | POA: Insufficient documentation

## 2019-09-22 DIAGNOSIS — R918 Other nonspecific abnormal finding of lung field: Secondary | ICD-10-CM | POA: Diagnosis not present

## 2019-09-22 MED ORDER — HEPARIN SOD (PORK) LOCK FLUSH 100 UNIT/ML IV SOLN
500.0000 [IU] | Freq: Once | INTRAVENOUS | Status: AC
  Administered 2019-09-22: 500 [IU] via INTRAVENOUS

## 2019-09-22 MED ORDER — AMLODIPINE BESYLATE 10 MG PO TABS: ORAL_TABLET | ORAL | 1 refills | Status: DC

## 2019-09-22 MED ORDER — GLIPIZIDE ER 5 MG PO TB24: ORAL_TABLET | ORAL | 1 refills | Status: DC

## 2019-09-22 MED ORDER — SODIUM CHLORIDE 0.9% FLUSH
10.0000 mL | Freq: Once | INTRAVENOUS | Status: AC
  Administered 2019-09-22: 10 mL via INTRAVENOUS

## 2019-09-22 NOTE — Progress Notes (Signed)
[place], [place] [address]   CLINIC:  Medical Oncology/Hematology  PCP:  [name], MD [address] 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 R-CHOP x 6 cycles from 05/24/2018 to 09/14/2018. 2. XRT to right axillary lymph nodes around 12/2018.  CURRENT THERAPY: Observation  INTERVAL HISTORY:  [name], a 54 y.o. 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 Devin Tran was last seen on 06/20/2019.  Today he reports that he was diagnosed with COVID on 08/14/2019; his symptoms were predominantly fevers, sinusitis and loss of taste and smell. He is fully vaccinated against COVID with Moderna vaccine. He denies having SOB or cough.   REVIEW OF SYSTEMS:  Review of Systems  Constitutional: Negative for appetite change and fatigue.  Respiratory: Negative for cough and shortness of breath.   Psychiatric/Behavioral: The patient is nervous/anxious.   All other systems reviewed and are negative.   PAST MEDICAL/SURGICAL HISTORY:  Past Medical History:  Diagnosis Date  . Complication of anesthesia   . Erectile dysfunction   . Hodgkin's lymphoma ([place])   . Hyperlipidemia   . Hypertension   . Infertility   . Low testosterone   . PONV (postoperative nausea and vomiting)    Past Surgical History:  Procedure Laterality Date  . AXILLARY LYMPH NODE BIOPSY Right 05/10/2018   Procedure: AXILLARY LYMPH NODE BIOPSY, RIGHT;  Surgeon: Aviva Signs, MD;  Location: [facility];  Service: General;  Laterality: Right;  . IR LYMPHANGIOGRAM EXTREMITY UNI LEFT  03/02/1987  . PORTA CATH INSERTION    . PORTA CATH REMOVAL    . PORTACATH PLACEMENT Left 05/21/2018   Procedure: INSERTION PORT-A-CATH (attached catheter in left subclavian);  Surgeon: Aviva Signs, MD;  Location: [facility];  Service:  General;  Laterality: Left;  . SPLENECTOMY Left Over 20 years ago   Due to Hodgkin's lymphoma    SOCIAL HISTORY:  Social History   Socioeconomic History  . Marital status: Single    Spouse name: Not on file  . Number of children: Not on file  . Years of education: Not on file  . Highest education level: Not on file  Occupational History  . Occupation: Drive Chief Operating Officer: [employer]  Tobacco Use  . Smoking status: Never Smoker  . Smokeless tobacco: Never Used  Vaping Use  . Vaping Use: Never assessed  Substance and Sexual Activity  . Alcohol use: No  . Drug use: No  . Sexual activity: Yes  Other Topics Concern  . Not on file  Social History Narrative  . Not on file   Social Determinants of Health   Financial Resource Strain:   . Difficulty of Paying Living Expenses: Not on file  Food Insecurity:   . Worried About Charity fundraiser in the Last Year: Not on file  . Ran Out of Food in the Last Year: Not on file  Transportation Needs:   . Lack of Transportation (Medical): Not on file  . Lack of Transportation (Non-Medical): Not on file  Physical Activity:   . Days of Exercise per Week: Not on file  . Minutes of Exercise per Session: Not on file  Stress:   . Feeling of Stress : Not on file  Social Connections:   . Frequency of Communication with Friends and Family:  Not on file  . Frequency of Social Gatherings with Friends and Family: Not on file  . Attends Religious Services: Not on file  . Active Member of Clubs or Organizations: Not on file  . Attends Archivist Meetings: Not on file  . Marital Status: Not on file  Intimate Partner Violence:   . Fear of Current or Ex-Partner: Not on file  . Emotionally Abused: Not on file  . Physically Abused: Not on file  . Sexually Abused: Not on file    FAMILY HISTORY:  Family History  Problem Relation Age of Onset  . Hypertension Mother   . Cancer Father        lung  . Hypertension Father    . Diabetes Father   . COPD Father   . Hypertension Brother   . Stroke Brother     CURRENT MEDICATIONS:  Current Outpatient Medications  Medication Sig Dispense Refill  . amLODipine (NORVASC) 10 MG tablet TAKE 1 TABLET BY MOUTH EVERY DAY (Needs to be seen before next refill) 30 tablet 0  . Ascorbic Acid (VITAMIN C) 1000 MG tablet Take 1,000 mg by mouth daily. Vitamin c lozenge    . aspirin EC 81 MG tablet Take 81 mg by mouth daily.     Marland Kitchen glipiZIDE (GLUCOTROL XL) 5 MG 24 hr tablet TAKE 1 TABLET BY MOUTH EVERY DAY WITH BREAKFAST 90 tablet 1  . Multiple Vitamins-Minerals (ONE-A-DAY MENS HEALTH FORMULA PO) Take 1 tablet by mouth daily.     . Multiple Vitamins-Minerals (ZINC PO) Take by mouth once.    Marland Kitchen VITAMIN D PO Take by mouth once.    . lidocaine-prilocaine (EMLA) cream Apply pea-sized amount to port a cath site and cover with plastic wrap one hour prior to each chemotherapy appointment (Patient not taking: Reported on 09/22/2019) 30 g 3   No current facility-administered medications for this visit.    ALLERGIES:  Allergies  Allergen Reactions  . Bleomycin Other (See Comments)    Cramping all over    PHYSICAL EXAM:  Performance status (ECOG): 1 - Symptomatic but completely ambulatory  Vitals:   09/22/19 1513  BP: 140/83   Wt Readings from Last 3 Encounters:  09/22/19 240 lb 3.2 oz (109 kg)  08/14/19 235 lb (106.6 kg)  06/20/19 245 lb (111.1 kg)   Physical Exam Vitals reviewed.  Constitutional:      Appearance: Normal appearance. He is obese.  Cardiovascular:     Rate and Rhythm: Normal rate and regular rhythm.     Pulses: Normal pulses.     Heart sounds: Normal heart sounds.  Pulmonary:     Effort: Pulmonary effort is normal.     Breath sounds: Normal breath sounds.  Abdominal:     Palpations: Abdomen is soft. There is no hepatomegaly or mass.     Tenderness: There is no abdominal tenderness.     Hernia: No hernia is present.  Lymphadenopathy:     Upper Body:       Right upper body: No axillary adenopathy.     Left upper body: No axillary adenopathy.  Neurological:     General: No focal deficit present.     Mental Status: He is alert and oriented to person, place, and time.  Psychiatric:        Mood and Affect: Mood normal.        Behavior: Behavior normal.     LABORATORY DATA:  I have reviewed the labs as listed.  CBC Latest  Ref Rng & Units 09/19/2019 08/14/2019 06/20/2019  WBC 4.0 - 10.5 K/uL 4.0 5.7 3.3(L)  Hemoglobin 13.0 - 17.0 g/dL 14.6 15.3 13.7  Hematocrit 39 - 52 % 45.1 47.4 42.7  Platelets 150 - 400 K/uL 268 205 221   CMP Latest Ref Rng & Units 09/19/2019 08/14/2019 06/20/2019  Glucose 70 - 99 mg/dL 177(H) 107(H) 183(H)  BUN 6 - 20 mg/dL 15 17 17   Creatinine 0.61 - 1.24 mg/dL 1.02 1.25(H) 1.09  Sodium 135 - 145 mmol/L 140 136 139  Potassium 3.5 - 5.1 mmol/L 3.7 3.5 3.5  Chloride 98 - 111 mmol/L 107 103 104  CO2 22 - 32 mmol/L 24 23 26   Calcium 8.9 - 10.3 mg/dL 9.2 9.1 9.0  Total Protein 6.5 - 8.1 g/dL 7.5 7.5 7.0  Total Bilirubin 0.3 - 1.2 mg/dL 0.4 0.6 0.5  Alkaline Phos 38 - 126 U/L 68 63 69  AST 15 - 41 U/L 22 23 22   ALT 0 - 44 U/L 33 32 29      Component Value Date/Time   RBC 5.35 09/19/2019 1024   MCV 84.3 09/19/2019 1024   MCV 83 02/10/2019 1634   MCH 27.3 09/19/2019 1024   MCHC 32.4 09/19/2019 1024   RDW 15.3 09/19/2019 1024   RDW 15.2 02/10/2019 1634   LYMPHSABS 1.7 09/19/2019 1024   LYMPHSABS 1.8 02/10/2019 1634   MONOABS 0.7 09/19/2019 1024   EOSABS 0.1 09/19/2019 1024   EOSABS 0.1 02/10/2019 1634   BASOSABS 0.0 09/19/2019 1024   BASOSABS 0.0 02/10/2019 1634   Lab Results  Component Value Date   LDH 169 09/19/2019   LDH 180 06/20/2019   LDH 225 (H) 03/21/2019    DIAGNOSTIC IMAGING:  I have independently reviewed the scans and discussed with the patient. CT Chest W Contrast  Result Date: 09/19/2019 CLINICAL DATA:  Follow-up Oz scans lymphoma status post chemotherapy. Also status post splenectomy.  EXAM: CT CHEST, ABDOMEN, AND PELVIS WITH CONTRAST TECHNIQUE: Multidetector CT imaging of the chest, abdomen and pelvis was performed following the standard protocol during bolus administration of intravenous contrast. CONTRAST:  14mL OMNIPAQUE IOHEXOL 300 MG/ML  SOLN COMPARISON:  PET scan of 03/21/2019 and scan of 11/01/2018 FINDINGS: CT CHEST FINDINGS Cardiovascular: LEFT-sided Port-A-Cath terminates in the proximal superior vena cava. Heart size is normal. Aortic caliber is normal without significant atherosclerotic change. Mediastinum/Nodes: Mildly enlarged RIGHT axillary lymph nodes, largest measuring 13 x 26 mm unchanged since September of 2020. Subpectoral lymph nodes in the RIGHT chest and high RIGHT axillary lymph nodes without change. No mediastinal lymphadenopathy. Esophagus grossly normal. Lungs/Pleura: Patchy bilateral ground-glass opacities more towards the periphery and more pronounced in the upper lobe on the RIGHT. No discrete nodule. Basilar atelectasis. Airways are patent. Musculoskeletal: See below for full musculoskeletal details. CT ABDOMEN PELVIS FINDINGS Hepatobiliary: Suggestion of mild steatosis. No focal, suspicious hepatic lesion. Portal vein is patent. Postprocedural changes in the porta hepatis. Gallbladder remains in situ. Findings unchanged since previous imaging. No biliary duct dilation. No pericholecystic stranding. Pancreas: Pancreas without ductal dilation or inflammation. Spleen: Spleen is surgically absent. No nodular areas or masses in the splenectomy bed. Adrenals/Urinary Tract: Adrenal glands are normal. Bilateral renal cysts with similar appearance to previous imaging. No hydronephrosis. Urinary bladder is normal. Stomach/Bowel: Gastrointestinal tract without acute process. Appendix is normal. Vascular/Lymphatic: Scattered calcified and noncalcified atheromatous plaque in the abdominal aorta. No aneurysmal dilation. No adenopathy in the retroperitoneum. No adenopathy in the  upper abdomen. Reproductive: No pelvic lymphadenopathy. Heterogeneous  prostate, nonspecific finding. Other: No ascites. Musculoskeletal: No acute bone finding. No destructive bone process. Spinal degenerative changes. IMPRESSION: 1. Patchy bilateral ground-glass opacities more towards the periphery and more pronounced in the upper lobe on the RIGHT. Findings may represent an infectious or inflammatory process. Correlation with any respiratory symptoms is suggested with attention on follow-up. 2. Mildly enlarged RIGHT axillary lymph nodes, largest measuring 13 x 26 mm unchanged since September of 2020. Subpectoral lymph nodes in the RIGHT chest and high RIGHT axillary lymph nodes without change. 3. No adenopathy in the abdomen or pelvis. 4. Bilateral renal cysts. 5. Suggestion of mild steatosis. 6. Aortic atherosclerosis. Aortic Atherosclerosis (ICD10-I70.0). Electronically Signed   By: Zetta Bills M.D.   On: 09/19/2019 14:01   CT Abdomen Pelvis W Contrast  Result Date: 09/19/2019 CLINICAL DATA:  Follow-up Oz scans lymphoma status post chemotherapy. Also status post splenectomy. EXAM: CT CHEST, ABDOMEN, AND PELVIS WITH CONTRAST TECHNIQUE: Multidetector CT imaging of the chest, abdomen and pelvis was performed following the standard protocol during bolus administration of intravenous contrast. CONTRAST:  159mL OMNIPAQUE IOHEXOL 300 MG/ML  SOLN COMPARISON:  PET scan of 03/21/2019 and scan of 11/01/2018 FINDINGS: CT CHEST FINDINGS Cardiovascular: LEFT-sided Port-A-Cath terminates in the proximal superior vena cava. Heart size is normal. Aortic caliber is normal without significant atherosclerotic change. Mediastinum/Nodes: Mildly enlarged RIGHT axillary lymph nodes, largest measuring 13 x 26 mm unchanged since September of 2020. Subpectoral lymph nodes in the RIGHT chest and high RIGHT axillary lymph nodes without change. No mediastinal lymphadenopathy. Esophagus grossly normal. Lungs/Pleura: Patchy bilateral  ground-glass opacities more towards the periphery and more pronounced in the upper lobe on the RIGHT. No discrete nodule. Basilar atelectasis. Airways are patent. Musculoskeletal: See below for full musculoskeletal details. CT ABDOMEN PELVIS FINDINGS Hepatobiliary: Suggestion of mild steatosis. No focal, suspicious hepatic lesion. Portal vein is patent. Postprocedural changes in the porta hepatis. Gallbladder remains in situ. Findings unchanged since previous imaging. No biliary duct dilation. No pericholecystic stranding. Pancreas: Pancreas without ductal dilation or inflammation. Spleen: Spleen is surgically absent. No nodular areas or masses in the splenectomy bed. Adrenals/Urinary Tract: Adrenal glands are normal. Bilateral renal cysts with similar appearance to previous imaging. No hydronephrosis. Urinary bladder is normal. Stomach/Bowel: Gastrointestinal tract without acute process. Appendix is normal. Vascular/Lymphatic: Scattered calcified and noncalcified atheromatous plaque in the abdominal aorta. No aneurysmal dilation. No adenopathy in the retroperitoneum. No adenopathy in the upper abdomen. Reproductive: No pelvic lymphadenopathy. Heterogeneous prostate, nonspecific finding. Other: No ascites. Musculoskeletal: No acute bone finding. No destructive bone process. Spinal degenerative changes. IMPRESSION: 1. Patchy bilateral ground-glass opacities more towards the periphery and more pronounced in the upper lobe on the RIGHT. Findings may represent an infectious or inflammatory process. Correlation with any respiratory symptoms is suggested with attention on follow-up. 2. Mildly enlarged RIGHT axillary lymph nodes, largest measuring 13 x 26 mm unchanged since September of 2020. Subpectoral lymph nodes in the RIGHT chest and high RIGHT axillary lymph nodes without change. 3. No adenopathy in the abdomen or pelvis. 4. Bilateral renal cysts. 5. Suggestion of mild steatosis. 6. Aortic atherosclerosis. Aortic  Atherosclerosis (ICD10-I70.0). Electronically Signed   By: Zetta Bills M.D.   On: 09/19/2019 14:01     ASSESSMENT:  1.  NLP Hodgkin's lymphoma, recurrent 3B: -6 cycles of R-CHOP from 05/24/2018 through 09/14/2018. -XRT to the right axillary lymph node completed around November 2020. -PET scan on 03/21/2019 showed right axillary and subpectoral lymph nodes with the largest right axillary lymph node  measuring 1.7 cm, [place] 2. -CT CAP on 09/19/2019 showed patchy bilateral groundglass opacities.  Mildly enlarged right axillary nodes largest measuring 13 x 26 mm unchanged since September 2020.  Subpectoral lymph nodes in the right chest and high right axillary lymph nodes without change.  No adenopathy in the abdomen or pelvis.  2.  Diabetes: -He is taking glipizide XR 5 mg daily. -We will check hemoglobin A1c next visit.  3.  Post splenectomy state: -Received Menactra and Pneumovax on 05/25/2018.   PLAN:  1.  NLP Hodgkin's lymphoma, recurrent 3B: -He is continuing to work full-time job.  No B symptoms.  Appetite and energy levels are 100%. -Physical exam did not reveal any palpable adenopathy. -We reviewed labs from 09/19/2019 which showed normal LDH.  LFTs are normal.  We also discussed and reviewed images of the CT scan from 09/19/2019. -I plan to see him back in 3 months with repeat physical exam and labs.  I plan to repeat scans in 6 months.  2.  Diabetes: -Hemoglobin A1c today is 7.3.  He is continuing glipizide XR 5 mg daily. -He will follow up with Dr. Livia Snellen.  Orders placed this encounter:  No orders of the defined types were placed in this encounter.    Derek Jack, MD [place] [phone]   I, Milinda Antis, am acting as a scribe for Dr. Sanda Linger.  I, Derek Jack MD, have reviewed the above documentation for accuracy and completeness, and I agree with the above.

## 2019-09-22 NOTE — Progress Notes (Signed)
Patient's Port-a-cath flushed with no blood returned noted but flushed easily and pain free. No bruising, pain or swelling noted at site. Band-aid applied. Patient discharged with satisfactory condition with vital signs stable. Patient to follow up as requested by Oncologist today for follow up visit.

## 2019-09-22 NOTE — Patient Instructions (Signed)
[facility] at [facility] Discharge Instructions  You were seen today by Dr. Katragadda. He went over your recent results and scans. Dr. Katragadda will see you back in 3 months for labs and follow up.   Thank you for choosing [facility] at [facility] to provide your oncology and hematology care.  To afford each patient quality time with our provider, please arrive at least 15 minutes before your scheduled appointment time.   If you have a lab appointment with the Cancer Center please come in thru the Main Entrance and check in at the main information desk  You need to re-schedule your appointment should you arrive 10 or more minutes late.  We strive to give you quality time with our providers, and arriving late affects you and other patients whose appointments are after yours.  Also, if you no show three or more times for appointments you may be dismissed from the clinic at the providers discretion.     Again, thank you for choosing [facility].  Our hope is that these requests will decrease the amount of time that you wait before being seen by our physicians.       _____________________________________________________________  Should you have questions after your visit to  Cancer Center, please contact our office at [phone] between the hours of 8:00 a.m. and 4:30 p.m.  Voicemails left after 4:00 p.m. will not be returned until the following business day.  For prescription refill requests, have your pharmacy contact our office and allow 72 hours.    Cancer Center Support Programs:   > Cancer Support Group  2nd Tuesday of the month 1pm-2pm, Journey Room    

## 2019-09-22 NOTE — Progress Notes (Signed)
Subjective:  Patient ID: Devin Tran, male    DOB: [birth year]  Age: 54 y.o. MRN: [id number]  CC: No chief complaint on file.   HPI Devin Tran presents for  follow-up of hypertension. Patient has no history of headache chest pain or shortness of breath or recent cough. Patient also denies symptoms of TIA such as focal numbness or weakness. Patient denies side effects from medication. States taking it regularly.   History Devin Tran has a past medical history of Complication of anesthesia, Erectile dysfunction, Hodgkin's lymphoma ([place]), Hyperlipidemia, Hypertension, Infertility, Low testosterone, and PONV (postoperative nausea and vomiting).   He has a past surgical history that includes Splenectomy (Left, Over 20 years ago); PORTA CATH INSERTION; PORTA CATH REMOVAL; Axillary lymph node biopsy (Right, 05/10/2018); Portacath placement (Left, 05/21/2018); and IR LYMPHANGIOGRAM EXTREMITY UNI LEFT (03/02/1987).   His family history includes COPD in his father; Cancer in his father; Diabetes in his father; Hypertension in his brother, father, and mother; Stroke in his brother.He reports that he has never smoked. He has never used smokeless tobacco. He reports that he does not drink alcohol and does not use drugs.  Current Outpatient Medications on File Prior to Visit  Medication Sig Dispense Refill  . Ascorbic Acid (VITAMIN C) 1000 MG tablet Take 1,000 mg by mouth daily. Vitamin c lozenge    . aspirin EC 81 MG tablet Take 81 mg by mouth daily.     Marland Kitchen lidocaine-prilocaine (EMLA) cream Apply pea-sized amount to port a cath site and cover with plastic wrap one hour prior to each chemotherapy appointment (Patient not taking: Reported on 09/22/2019) 30 g 3  . Multiple Vitamins-Minerals (ONE-A-DAY MENS HEALTH FORMULA PO) Take 1 tablet by mouth daily.     . Multiple Vitamins-Minerals (ZINC PO) Take by mouth once.    Marland Kitchen VITAMIN D PO Take by mouth once.     No current facility-administered medications on file  prior to visit.    ROS Review of Systems  Constitutional: Negative.   HENT: Negative.   Eyes: Negative for visual disturbance.  Respiratory: Negative for cough and shortness of breath.   Cardiovascular: Negative for chest pain and leg swelling.  Gastrointestinal: Negative for abdominal pain, diarrhea, nausea and vomiting.  Genitourinary: Negative for difficulty urinating.  Musculoskeletal: Negative for arthralgias and myalgias.  Skin: Negative for rash.  Neurological: Negative for headaches.  Psychiatric/Behavioral: Negative for sleep disturbance.    Objective:  There were no vitals taken for this visit.  BP Readings from Last 3 Encounters:  09/22/19 140/83  08/14/19 (!) 139/92  06/20/19 136/89    Wt Readings from Last 3 Encounters:  09/22/19 240 lb 3.2 oz (109 kg)  08/14/19 235 lb (106.6 kg)  06/20/19 245 lb (111.1 kg)     Physical Exam  .Exam deferred. Pt. [place] due to COVID 19. Phone visit performed.(Above vitals for today taken at another location and transferred here)   Assessment & Plan:   Diagnoses and all orders for this visit:  Essential hypertension  Type 2 diabetes mellitus without complication, without long-term current use of insulin (HCC) -     glipiZIDE (GLUCOTROL XL) 5 MG 24 hr tablet; TAKE 1 TABLET BY MOUTH EVERY DAY WITH BREAKFAST  Other orders -     amLODipine (NORVASC) 10 MG tablet; TAKE 1 TABLET BY MOUTH EVERY DAY (Needs to be seen before next refill)   Allergies as of 09/22/2019      Reactions   Bleomycin Other (See Comments)  Cramping all over      Medication List       Accurate as of September 22, 2019  4:29 PM. If you have any questions, ask your nurse or doctor.        amLODipine 10 MG tablet Commonly known as: NORVASC TAKE 1 TABLET BY MOUTH EVERY DAY (Needs to be seen before next refill)   aspirin EC 81 MG tablet Take 81 mg by mouth daily.   glipiZIDE 5 MG 24 hr tablet Commonly known as: GLUCOTROL XL TAKE 1 TABLET  BY MOUTH EVERY DAY WITH BREAKFAST   lidocaine-prilocaine cream Commonly known as: EMLA Apply pea-sized amount to port a cath site and cover with plastic wrap one hour prior to each chemotherapy appointment   ONE-A-DAY MENS HEALTH FORMULA PO Take 1 tablet by mouth daily.   vitamin C 1000 MG tablet Take 1,000 mg by mouth daily. Vitamin c lozenge   VITAMIN D PO Take by mouth once.   ZINC PO Take by mouth once.       Meds ordered this encounter  Medications  . amLODipine (NORVASC) 10 MG tablet    Sig: TAKE 1 TABLET BY MOUTH EVERY DAY (Needs to be seen before next refill)    Dispense:  90 tablet    Refill:  1  . glipiZIDE (GLUCOTROL XL) 5 MG 24 hr tablet    Sig: TAKE 1 TABLET BY MOUTH EVERY DAY WITH BREAKFAST    Dispense:  90 tablet    Refill:  1    Follow-up: No follow-ups on file.  [name], M.D.

## 2019-12-15 ENCOUNTER — Inpatient Hospital Stay (HOSPITAL_COMMUNITY): Payer: 59 | Attending: Hematology

## 2019-12-15 ENCOUNTER — Other Ambulatory Visit: Payer: Self-pay

## 2019-12-15 ENCOUNTER — Encounter (HOSPITAL_COMMUNITY): Payer: Self-pay

## 2019-12-15 VITALS — BP 131/90 | HR 87 | Temp 96.9°F | Resp 18

## 2019-12-15 DIAGNOSIS — E119 Type 2 diabetes mellitus without complications: Secondary | ICD-10-CM | POA: Insufficient documentation

## 2019-12-15 DIAGNOSIS — Z7982 Long term (current) use of aspirin: Secondary | ICD-10-CM | POA: Diagnosis not present

## 2019-12-15 DIAGNOSIS — I10 Essential (primary) hypertension: Secondary | ICD-10-CM | POA: Insufficient documentation

## 2019-12-15 DIAGNOSIS — Z9081 Acquired absence of spleen: Secondary | ICD-10-CM | POA: Insufficient documentation

## 2019-12-15 DIAGNOSIS — Z923 Personal history of irradiation: Secondary | ICD-10-CM | POA: Insufficient documentation

## 2019-12-15 DIAGNOSIS — Z95828 Presence of other vascular implants and grafts: Secondary | ICD-10-CM

## 2019-12-15 DIAGNOSIS — Z23 Encounter for immunization: Secondary | ICD-10-CM | POA: Insufficient documentation

## 2019-12-15 DIAGNOSIS — Z79899 Other long term (current) drug therapy: Secondary | ICD-10-CM | POA: Insufficient documentation

## 2019-12-15 DIAGNOSIS — Z7984 Long term (current) use of oral hypoglycemic drugs: Secondary | ICD-10-CM | POA: Diagnosis not present

## 2019-12-15 DIAGNOSIS — Z9221 Personal history of antineoplastic chemotherapy: Secondary | ICD-10-CM | POA: Diagnosis not present

## 2019-12-15 DIAGNOSIS — Z08 Encounter for follow-up examination after completed treatment for malignant neoplasm: Secondary | ICD-10-CM | POA: Diagnosis present

## 2019-12-15 DIAGNOSIS — E785 Hyperlipidemia, unspecified: Secondary | ICD-10-CM | POA: Insufficient documentation

## 2019-12-15 DIAGNOSIS — C8108 Nodular lymphocyte predominant Hodgkin lymphoma, lymph nodes of multiple sites: Secondary | ICD-10-CM

## 2019-12-15 DIAGNOSIS — Z8571 Personal history of Hodgkin lymphoma: Secondary | ICD-10-CM | POA: Insufficient documentation

## 2019-12-15 LAB — CBC WITH DIFFERENTIAL/PLATELET
Abs Immature Granulocytes: 0.01 10*3/uL (ref 0.00–0.07)
Basophils Absolute: 0 10*3/uL (ref 0.0–0.1)
Basophils Relative: 1 %
Eosinophils Absolute: 0.1 10*3/uL (ref 0.0–0.5)
Eosinophils Relative: 3 %
HCT: 43.7 % (ref 39.0–52.0)
Hemoglobin: 14.1 g/dL (ref 13.0–17.0)
Immature Granulocytes: 0 %
Lymphocytes Relative: 40 %
Lymphs Abs: 1.2 10*3/uL (ref 0.7–4.0)
MCH: 27.5 pg (ref 26.0–34.0)
MCHC: 32.3 g/dL (ref 30.0–36.0)
MCV: 85.4 fL (ref 80.0–100.0)
Monocytes Absolute: 0.5 10*3/uL (ref 0.1–1.0)
Monocytes Relative: 18 %
Neutro Abs: 1.1 10*3/uL — ABNORMAL LOW (ref 1.7–7.7)
Neutrophils Relative %: 38 %
Platelets: 219 10*3/uL (ref 150–400)
RBC: 5.12 MIL/uL (ref 4.22–5.81)
RDW: 15.3 % (ref 11.5–15.5)
WBC: 2.9 10*3/uL — ABNORMAL LOW (ref 4.0–10.5)
nRBC: 0 % (ref 0.0–0.2)

## 2019-12-15 LAB — COMPREHENSIVE METABOLIC PANEL
ALT: 28 U/L (ref 0–44)
AST: 24 U/L (ref 15–41)
Albumin: 4.5 g/dL (ref 3.5–5.0)
Alkaline Phosphatase: 66 U/L (ref 38–126)
Anion gap: 11 (ref 5–15)
BUN: 16 mg/dL (ref 6–20)
CO2: 24 mmol/L (ref 22–32)
Calcium: 9.1 mg/dL (ref 8.9–10.3)
Chloride: 106 mmol/L (ref 98–111)
Creatinine, Ser: 1.12 mg/dL (ref 0.61–1.24)
GFR, Estimated: >60 mL/min (ref >60)
Glucose, Bld: 134 mg/dL — ABNORMAL HIGH (ref 70–99)
Potassium: 3.6 mmol/L (ref 3.5–5.1)
Sodium: 141 mmol/L (ref 135–145)
Total Bilirubin: 0.5 mg/dL (ref 0.3–1.2)
Total Protein: 7.2 g/dL (ref 6.5–8.1)

## 2019-12-15 LAB — LACTATE DEHYDROGENASE: LDH: 185 U/L (ref 98–192)

## 2019-12-15 LAB — SEDIMENTATION RATE: Sed Rate: 2 mm/hr (ref 0–16)

## 2019-12-15 MED ORDER — SODIUM CHLORIDE 0.9% FLUSH
10.0000 mL | INTRAVENOUS | Status: DC | PRN
  Administered 2019-12-15: 10 mL via INTRAVENOUS

## 2019-12-15 MED ORDER — HEPARIN SOD (PORK) LOCK FLUSH 100 UNIT/ML IV SOLN
500.0000 [IU] | Freq: Once | INTRAVENOUS | Status: AC
  Administered 2019-12-15: 500 [IU] via INTRAVENOUS

## 2019-12-15 NOTE — Progress Notes (Signed)
Sonia Baller presented for Portacath access and flush.  Portacath located left chest wall accessed with  H 20 needle.  No blood return - flushes w/o difficulty w/ no c/o pain or s/s infiltration at site. Portacath flushed with 33ml NS and 500U/13ml Heparin and needle removed intact.  Procedure tolerated well and without incident.  Brittan L Goold's reason for visit today is for labs as scheduled per MD orders.  Venipuncture performed with a 23 gauge butterfly needle to R Antecubital.  Horton L Kleine tolerated procedure well and without incident; questions were answered and patient was discharged. Discharged ambulatory in stable condition.

## 2019-12-22 ENCOUNTER — Other Ambulatory Visit: Payer: Self-pay

## 2019-12-22 ENCOUNTER — Inpatient Hospital Stay (HOSPITAL_BASED_OUTPATIENT_CLINIC_OR_DEPARTMENT_OTHER): Payer: 59 | Admitting: Hematology

## 2019-12-22 VITALS — BP 144/94 | HR 90 | Temp 97.1°F | Resp 18 | Wt 241.8 lb

## 2019-12-22 DIAGNOSIS — Z23 Encounter for immunization: Secondary | ICD-10-CM | POA: Diagnosis not present

## 2019-12-22 DIAGNOSIS — C8108 Nodular lymphocyte predominant Hodgkin lymphoma, lymph nodes of multiple sites: Secondary | ICD-10-CM

## 2019-12-22 DIAGNOSIS — Z08 Encounter for follow-up examination after completed treatment for malignant neoplasm: Secondary | ICD-10-CM | POA: Diagnosis not present

## 2019-12-22 MED ORDER — INFLUENZA VAC SPLIT QUAD 0.5 ML IM SUSY
0.5000 mL | PREFILLED_SYRINGE | Freq: Once | INTRAMUSCULAR | Status: AC
  Administered 2019-12-22: 0.5 mL via INTRAMUSCULAR
  Filled 2019-12-22: qty 0.5

## 2019-12-22 NOTE — Progress Notes (Signed)
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 R-CHOP x 6 cycles from 05/24/2018 to 09/14/2018. 2. XRT to right axillary lymph nodes around 12/2018.  CURRENT THERAPY: Observation  INTERVAL HISTORY:  [name], a 54 y.o. 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 Devin Tran was last seen on 09/22/2019.  Today he reports feeling well. He continues working and he denies having any recent infections. His energy levels are good and he is trying to exercise multiple times per week. He is not taking any antibiotics and denies any tick bites. He is currently taking glipizide 5 mg daily. He denies having numbness or tingling. He drinks about 60 ounces of water at work and then some at work and he notes that he urinates at home more than at work.  He sees Dr. Livia Tran twice to three times per year. He is inquiring about getting his flu shot today. He received his Moderna COVID vaccines in April and was sick with COVID in July.   REVIEW OF SYSTEMS:  Review of Systems  Constitutional: Positive for fatigue (75%). Negative for appetite change.  Gastrointestinal: Positive for constipation (occasional).  Genitourinary: Positive for frequency.   Neurological: Negative for numbness.  All other systems reviewed and are negative.   PAST MEDICAL/SURGICAL HISTORY:  Past Medical History:  Diagnosis Date   Complication of anesthesia    Erectile dysfunction    Hodgkin's lymphoma ([place])    Hyperlipidemia    Hypertension    Infertility    Low testosterone    PONV (postoperative nausea and vomiting)    Past Surgical History:  Procedure Laterality Date   AXILLARY LYMPH NODE BIOPSY  Right 05/10/2018   Procedure: AXILLARY LYMPH NODE BIOPSY, RIGHT;  Surgeon: Devin Signs, MD;  Location: [facility];  Service: General;  Laterality: Right;   IR LYMPHANGIOGRAM EXTREMITY UNI LEFT  03/02/1987   PORTA CATH INSERTION     PORTA CATH REMOVAL     PORTACATH PLACEMENT Left 05/21/2018   Procedure: INSERTION PORT-A-CATH (attached catheter in left subclavian);  Surgeon: Devin Signs, MD;  Location: [facility];  Service: General;  Laterality: Left;   SPLENECTOMY Left Over 20 years ago   Due to Hodgkin's lymphoma    SOCIAL HISTORY:  Social History   Socioeconomic History   Marital status: Single    Spouse name: Not on file   Number of children: Not on file   Years of education: Not on file   Highest education level: Not on file  Occupational History   Occupation: Devin Tran    Employer: [employer]  Tobacco Use   Smoking status: Never Smoker   Smokeless tobacco: Never Used  Scientific laboratory technician Use: Never assessed  Substance and Sexual Activity   Alcohol use: No   Drug use: No   Sexual activity: Yes  Other Topics Concern   Not on file  Social History Narrative   Not on file   Social Determinants of Health   Financial Resource Strain:    Difficulty of Paying Living Expenses: Not on file  Food Insecurity:    Worried About Devin Tran in the Last Year: Not on file   [employer]  of Food in the Last Year: Not on file  Transportation Needs:    Lack of Transportation (Medical): Not on file   Lack of Transportation (Non-Medical): Not on file  Physical Activity:    Days of Exercise per Week: Not on file   Minutes of Exercise per Session: Not on file  Stress:    Feeling of Stress : Not on file  Social Connections:    Frequency of Communication with Friends and Family: Not on file   Frequency of Social Gatherings with Friends and Family: Not on file   Attends Religious Services: Not on file   Active Member of Clubs or Organizations:  Not on file   Attends Archivist Meetings: Not on file   Marital Status: Not on file  Intimate Partner Violence:    Fear of Current or Ex-Partner: Not on file   Emotionally Abused: Not on file   Physically Abused: Not on file   Sexually Abused: Not on file    FAMILY HISTORY:  Family History  Problem Relation Age of Onset   Hypertension Mother    Cancer Father        lung   Hypertension Father    Diabetes Father    COPD Father    Hypertension Brother    Stroke Brother     CURRENT MEDICATIONS:  Current Outpatient Medications  Medication Sig Dispense Refill   amLODipine (NORVASC) 10 MG tablet TAKE 1 TABLET BY MOUTH EVERY DAY (Needs to be seen before next refill) 90 tablet 1   Ascorbic Acid (VITAMIN C) 1000 MG tablet Take 1,000 mg by mouth daily. Vitamin c lozenge     aspirin EC 81 MG tablet Take 81 mg by mouth daily.      glipiZIDE (GLUCOTROL XL) 5 MG 24 hr tablet TAKE 1 TABLET BY MOUTH EVERY DAY WITH BREAKFAST 90 tablet 1   Multiple Vitamins-Minerals (ONE-A-DAY MENS HEALTH FORMULA PO) Take 1 tablet by mouth daily.      Multiple Vitamins-Minerals (ZINC PO) Take by mouth once.     VITAMIN D PO Take by mouth once.     lidocaine-prilocaine (EMLA) cream Apply pea-sized amount to port a cath site and cover with plastic wrap one hour prior to each chemotherapy appointment (Patient not taking: Reported on 12/22/2019) 30 g 3   Current Facility-Administered Medications  Medication Dose Route Frequency Provider Last Rate Last Admin   influenza vac split quadrivalent PF (FLUARIX) injection 0.5 mL  0.5 mL Intramuscular Once Devin Jack, MD        ALLERGIES:  Allergies  Allergen Reactions   Bleomycin Other (See Comments)    Cramping all over    PHYSICAL EXAM:  Performance status (ECOG): 1 - Symptomatic but completely ambulatory  Vitals:   12/22/19 1556  BP: (!) 144/94  Pulse: 90  Resp: 18  Temp: (!) 97.1 F (36.2 C)  SpO2: 95%   Wt  Readings from Last 3 Encounters:  12/22/19 241 lb 12.8 oz (109.7 kg)  09/22/19 240 lb 3.2 oz (109 kg)  08/14/19 235 lb (106.6 kg)   Physical Exam Vitals reviewed.  Constitutional:      Appearance: Normal appearance. He is obese.  Cardiovascular:     Rate and Rhythm: Normal rate and regular rhythm.     Pulses: Normal pulses.     Heart sounds: Normal heart sounds.  Pulmonary:     Effort: Pulmonary effort is normal.     Breath sounds: Normal breath sounds.  Lymphadenopathy:  Cervical: No cervical adenopathy.     Upper Body:     Right upper body: No supraclavicular, axillary or pectoral adenopathy.     Left upper body: No supraclavicular, axillary or pectoral adenopathy.     Lower Body: No right inguinal adenopathy. No left inguinal adenopathy.  Neurological:     General: No focal deficit present.     Mental Status: He is alert and oriented to person, place, and time.  Psychiatric:        Mood and Affect: Mood normal.        Behavior: Behavior normal.     LABORATORY DATA:  I have reviewed the labs as listed.  CBC Latest Ref Rng & Units 12/15/2019 09/19/2019 08/14/2019  WBC 4.0 - 10.5 K/uL 2.9(L) 4.0 5.7  Hemoglobin 13.0 - 17.0 g/dL 14.1 14.6 15.3  Hematocrit 39 - 52 % 43.7 45.1 47.4  Platelets 150 - 400 K/uL 219 268 205   CMP Latest Ref Rng & Units 12/15/2019 09/19/2019 08/14/2019  Glucose 70 - 99 mg/dL 134(H) 177(H) 107(H)  BUN 6 - 20 mg/dL 16 15 17   Creatinine 0.61 - 1.24 mg/dL 1.12 1.02 1.25(H)  Sodium 135 - 145 mmol/L 141 140 136  Potassium 3.5 - 5.1 mmol/L 3.6 3.7 3.5  Chloride 98 - 111 mmol/L 106 107 103  CO2 22 - 32 mmol/L 24 24 23   Calcium 8.9 - 10.3 mg/dL 9.1 9.2 9.1  Total Protein 6.5 - 8.1 g/dL 7.2 7.5 7.5  Total Bilirubin 0.3 - 1.2 mg/dL 0.5 0.4 0.6  Alkaline Phos 38 - 126 U/L 66 68 63  AST 15 - 41 U/L 24 22 23   ALT 0 - 44 U/L 28 33 32      Component Value Date/Time   RBC 5.12 12/15/2019 1512   MCV 85.4 12/15/2019 1512   MCV 83 02/10/2019 1634   MCH  27.5 12/15/2019 1512   MCHC 32.3 12/15/2019 1512   RDW 15.3 12/15/2019 1512   RDW 15.2 02/10/2019 1634   LYMPHSABS 1.2 12/15/2019 1512   LYMPHSABS 1.8 02/10/2019 1634   MONOABS 0.5 12/15/2019 1512   EOSABS 0.1 12/15/2019 1512   EOSABS 0.1 02/10/2019 1634   BASOSABS 0.0 12/15/2019 1512   BASOSABS 0.0 02/10/2019 1634   Lab Results  Component Value Date   LDH 185 12/15/2019   LDH 169 09/19/2019   LDH 180 06/20/2019    DIAGNOSTIC IMAGING:  I have independently reviewed the scans and discussed with the patient. No results found.   ASSESSMENT:  1. NLP Hodgkin's lymphoma, recurrent 3B: -6 cycles of R-CHOP from 05/24/2018 through 09/14/2018. -XRT to the right axillary lymph node completed around November 2020. -PET scan on 03/21/2019 showed right axillary and subpectoral lymph nodes with the largest right axillary lymph node measuring 1.7 cm, [place] 2. -CT CAP on 09/19/2019 showed patchy bilateral groundglass opacities.  Mildly enlarged right axillary nodes largest measuring 13 x 26 mm unchanged since September 2020.  Subpectoral lymph nodes in the right chest and high right axillary lymph nodes without change.  No adenopathy in the abdomen or pelvis.  2. Diabetes: -He is taking glipizide XR 5 mg daily. -We will check hemoglobin A1c next visit.  3. Post splenectomy state: -Received Menactra and Pneumovax on 05/25/2018.   PLAN:  1. NLP Hodgkin's lymphoma, recurrent 3B: -No B symptoms.  No palpable adenopathy. 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 -Reviewed labs from 12/15/2019.  LDH is normal.  CBC shows slightly decreased white count of  2.9 with ANC of 1.2.  No clear reason at this time. -I plan to see him back in 3 months for follow-up.  I plan to repeat CT CAP along with routine blood work.   2. Diabetes: -Continue glipizide XL 5 mg daily.  Last hemoglobin A1c 7.3. -Plan to check hemoglobin A1c in 3 months.  Orders placed this encounter:  No  orders of the defined types were placed in this encounter.    Devin Jack, MD [place] [phone]   I, Milinda Antis, am acting as a scribe for Dr. Sanda Linger.  I, Devin Jack MD, have reviewed the above documentation for accuracy and completeness, and I agree with the above.

## 2019-12-22 NOTE — Patient Instructions (Addendum)
[place] at [facility] Discharge Instructions  You were seen today by Dr. Delton Coombes. He went over your recent results. You received your flu shot today; you may proceed with getting your COVID booster. You will be scheduled for a CT scan of your chest and abdomen before your next visit. Dr. Delton Coombes will see you back in 3 months for labs and follow up.   Thank you for choosing [place] at [facility] to provide your oncology and hematology care.  To afford each patient quality time with our provider, please arrive at least 15 minutes before your scheduled appointment time.   If you have a lab appointment with the [facility] please come in thru the Main Entrance and check in at the main information desk  You need to re-schedule your appointment should you arrive 10 or more minutes late.  We strive to give you quality time with our providers, and arriving late affects you and other patients whose appointments are after yours.  Also, if you no show three or more times for appointments you may be dismissed from the clinic at the providers discretion.     Again, thank you for choosing [facility].  Our hope is that these requests will decrease the amount of time that you wait before being seen by our physicians.       _____________________________________________________________  Should you have questions after your visit to [facility], please contact our office at [phone] between the hours of 8:00 a.m. and 4:30 p.m.  Voicemails left after 4:00 p.m. will not be returned until the following business day.  For prescription refill requests, have your pharmacy contact our office and allow 72 hours.    Cancer Center Support Programs:   > Cancer Support Group  2nd Tuesday of the month 1pm-2pm, Journey Room

## 2020-03-15 ENCOUNTER — Other Ambulatory Visit: Payer: Self-pay | Admitting: Family Medicine

## 2020-03-23 ENCOUNTER — Inpatient Hospital Stay (HOSPITAL_COMMUNITY): Payer: 59 | Attending: Hematology

## 2020-03-23 ENCOUNTER — Other Ambulatory Visit: Payer: Self-pay

## 2020-03-23 DIAGNOSIS — Z8571 Personal history of Hodgkin lymphoma: Secondary | ICD-10-CM | POA: Diagnosis present

## 2020-03-23 DIAGNOSIS — E119 Type 2 diabetes mellitus without complications: Secondary | ICD-10-CM | POA: Diagnosis not present

## 2020-03-23 DIAGNOSIS — Z9081 Acquired absence of spleen: Secondary | ICD-10-CM | POA: Insufficient documentation

## 2020-03-23 DIAGNOSIS — C8108 Nodular lymphocyte predominant Hodgkin lymphoma, lymph nodes of multiple sites: Secondary | ICD-10-CM

## 2020-03-23 LAB — COMPREHENSIVE METABOLIC PANEL
ALT: 31 U/L (ref 0–44)
AST: 25 U/L (ref 15–41)
Albumin: 4.3 g/dL (ref 3.5–5.0)
Alkaline Phosphatase: 65 U/L (ref 38–126)
Anion gap: 6 (ref 5–15)
BUN: 15 mg/dL (ref 6–20)
CO2: 25 mmol/L (ref 22–32)
Calcium: 9.1 mg/dL (ref 8.9–10.3)
Chloride: 108 mmol/L (ref 98–111)
Creatinine, Ser: 1.06 mg/dL (ref 0.61–1.24)
GFR, Estimated: >60 mL/min (ref >60)
Glucose, Bld: 150 mg/dL — ABNORMAL HIGH (ref 70–99)
Potassium: 3.6 mmol/L (ref 3.5–5.1)
Sodium: 139 mmol/L (ref 135–145)
Total Bilirubin: 0.9 mg/dL (ref 0.3–1.2)
Total Protein: 6.7 g/dL (ref 6.5–8.1)

## 2020-03-23 LAB — CBC WITH DIFFERENTIAL/PLATELET
Abs Immature Granulocytes: 0.01 10*3/uL (ref 0.00–0.07)
Basophils Absolute: 0 10*3/uL (ref 0.0–0.1)
Basophils Relative: 1 %
Eosinophils Absolute: 0.1 10*3/uL (ref 0.0–0.5)
Eosinophils Relative: 3 %
HCT: 44 % (ref 39.0–52.0)
Hemoglobin: 14.2 g/dL (ref 13.0–17.0)
Immature Granulocytes: 0 %
Lymphocytes Relative: 47 %
Lymphs Abs: 1.3 10*3/uL (ref 0.7–4.0)
MCH: 27.5 pg (ref 26.0–34.0)
MCHC: 32.3 g/dL (ref 30.0–36.0)
MCV: 85.3 fL (ref 80.0–100.0)
Monocytes Absolute: 0.4 10*3/uL (ref 0.1–1.0)
Monocytes Relative: 15 %
Neutro Abs: 0.9 10*3/uL — ABNORMAL LOW (ref 1.7–7.7)
Neutrophils Relative %: 34 %
Platelets: 221 10*3/uL (ref 150–400)
RBC: 5.16 MIL/uL (ref 4.22–5.81)
RDW: 15.5 % (ref 11.5–15.5)
WBC: 2.7 10*3/uL — ABNORMAL LOW (ref 4.0–10.5)
nRBC: 0 % (ref 0.0–0.2)

## 2020-03-23 LAB — LACTATE DEHYDROGENASE: LDH: 167 U/L (ref 98–192)

## 2020-03-23 LAB — SEDIMENTATION RATE: Sed Rate: 3 mm/hr (ref 0–16)

## 2020-03-23 NOTE — Progress Notes (Signed)
Patients port flushed without difficulty.  No blood return noted. No bruising or swelling noted at site.  Band aid applied.  VSS with discharge and left in satisfactory condition with no s/s of distress noted.  

## 2020-03-26 ENCOUNTER — Ambulatory Visit (INDEPENDENT_AMBULATORY_CARE_PROVIDER_SITE_OTHER): Payer: 59 | Admitting: Family Medicine

## 2020-03-26 ENCOUNTER — Encounter: Payer: Self-pay | Admitting: Family Medicine

## 2020-03-26 ENCOUNTER — Other Ambulatory Visit: Payer: Self-pay

## 2020-03-26 ENCOUNTER — Ambulatory Visit (HOSPITAL_COMMUNITY)
Admission: RE | Admit: 2020-03-26 | Discharge: 2020-03-26 | Disposition: A | Discharge status: Home / Self Care | Payer: 59 | Source: Ambulatory Visit | Attending: Hematology | Admitting: Hematology

## 2020-03-26 VITALS — BP 136/86 | HR 84 | Temp 96.9°F | Resp 20 | Ht 73.0 in | Wt 243.1 lb

## 2020-03-26 DIAGNOSIS — E782 Mixed hyperlipidemia: Secondary | ICD-10-CM

## 2020-03-26 DIAGNOSIS — Z0001 Encounter for general adult medical examination with abnormal findings: Secondary | ICD-10-CM | POA: Diagnosis not present

## 2020-03-26 DIAGNOSIS — E538 Deficiency of other specified B group vitamins: Secondary | ICD-10-CM

## 2020-03-26 DIAGNOSIS — I10 Essential (primary) hypertension: Secondary | ICD-10-CM

## 2020-03-26 DIAGNOSIS — C8108 Nodular lymphocyte predominant Hodgkin lymphoma, lymph nodes of multiple sites: Secondary | ICD-10-CM | POA: Diagnosis not present

## 2020-03-26 DIAGNOSIS — E119 Type 2 diabetes mellitus without complications: Secondary | ICD-10-CM

## 2020-03-26 DIAGNOSIS — E559 Vitamin D deficiency, unspecified: Secondary | ICD-10-CM

## 2020-03-26 DIAGNOSIS — Z125 Encounter for screening for malignant neoplasm of prostate: Secondary | ICD-10-CM

## 2020-03-26 DIAGNOSIS — Z0000 Encounter for general adult medical examination without abnormal findings: Secondary | ICD-10-CM

## 2020-03-26 DIAGNOSIS — Z1211 Encounter for screening for malignant neoplasm of colon: Secondary | ICD-10-CM

## 2020-03-26 LAB — BAYER DCA HB A1C WAIVED: HB A1C (BAYER DCA - WAIVED): 7.1 % — ABNORMAL HIGH (ref <7.0)

## 2020-03-26 MED ORDER — IOHEXOL 300 MG/ML  SOLN
100.0000 mL | Freq: Once | INTRAMUSCULAR | Status: AC | PRN
  Administered 2020-03-26: 100 mL via INTRAVENOUS

## 2020-03-26 MED ORDER — METFORMIN HCL ER 750 MG PO TB24: 750.0000 mg | ORAL_TABLET | Freq: Every day | ORAL | 1 refills | Status: DC

## 2020-03-26 MED ORDER — BLOOD GLUCOSE MONITOR KIT: PACK | 0 refills | Status: DC

## 2020-03-26 NOTE — Progress Notes (Addendum)
Subjective:  Patient ID: Devin Tran, male    DOB: 1965-06-23  Age: 55 y.o. MRN: [id number]  CC: Annual Exam   HPI COREY LASKI presents for annual exam. Still having follow up with Dr. Lamonte Richer for  Hodgkins. Had blood work last week. Alsoa CT this AM. Will see him next week.   Pt. Not checking blood glucose. Wasn't sure he was actually a diabetic so not checking glucose, but he is taking the glipizide. exercising. Following low carb diet, somewhat.   Depression screen [facility] 2/9 03/26/2020 01/12/2018 07/13/2017  Decreased Interest 0 0 0  Down, Depressed, Hopeless 0 0 0  PHQ - 2 Score 0 0 0    History Joandy has a past medical history of Complication of anesthesia, Erectile dysfunction, Hodgkin's lymphoma ([place]), Hyperlipidemia, Hypertension, Infertility, Low testosterone, and PONV (postoperative nausea and vomiting).   He has a past surgical history that includes Splenectomy (Left, Over 20 years ago); PORTA CATH INSERTION; PORTA CATH REMOVAL; Axillary lymph node biopsy (Right, 05/10/2018); Portacath placement (Left, 05/21/2018); and IR LYMPHANGIOGRAM EXTREMITY UNI LEFT (03/02/1987).   His family history includes COPD in his father; Cancer in his father; Diabetes in his father; Hypertension in his brother, father, and mother; Stroke in his brother.He reports that he has never smoked. He has never used smokeless tobacco. He reports that he does not drink alcohol and does not use drugs.    ROS Review of Systems  Constitutional: Negative for activity change, fatigue and unexpected weight change.  HENT: Negative for congestion, ear pain, hearing loss, postnasal drip and trouble swallowing.   Eyes: Negative for pain and visual disturbance.  Respiratory: Negative for cough, chest tightness and shortness of breath.   Cardiovascular: Negative for chest pain, palpitations and leg swelling.  Gastrointestinal: Negative for abdominal distention, abdominal pain, blood in stool, constipation,  diarrhea, nausea and vomiting.  Endocrine: Negative for cold intolerance, heat intolerance and polydipsia.  Genitourinary: Negative for difficulty urinating, dysuria, flank pain, frequency and urgency.  Musculoskeletal: Negative for arthralgias and joint swelling.  Skin: Negative for color change, rash and wound.  Neurological: Negative for dizziness, syncope, speech difficulty, weakness, light-headedness, numbness and headaches.  Hematological: Does not bruise/bleed easily.  Psychiatric/Behavioral: Negative for confusion, decreased concentration, dysphoric mood and sleep disturbance. The patient is not nervous/anxious.     Objective:  BP 136/86   Pulse 84   Temp (!) 96.9 F (36.1 C) (Temporal)   Resp 20   Ht '6\' 1"'  (1.854 m)   Wt 243 lb 2 oz (110.3 kg)   SpO2 97%   BMI 32.08 kg/m   BP Readings from Last 3 Encounters:  03/26/20 136/86  03/23/20 (!) 143/90  12/22/19 (!) 144/94    Wt Readings from Last 3 Encounters:  03/26/20 243 lb 2 oz (110.3 kg)  03/23/20 243 lb 3.2 oz (110.3 kg)  12/22/19 241 lb 12.8 oz (109.7 kg)     Physical Exam Constitutional:      Appearance: He is well-developed and well-nourished.  HENT:     Head: Normocephalic and atraumatic.     Mouth/Throat:     Mouth: Oropharynx is clear and moist.  Eyes:     Extraocular Movements: EOM normal.     Pupils: Pupils are equal, round, and reactive to light.  Neck:     Thyroid: No thyromegaly.     Trachea: No tracheal deviation.  Cardiovascular:     Rate and Rhythm: Normal rate and regular rhythm.     Heart  sounds: Normal heart sounds. No murmur heard. No friction rub. No gallop.   Pulmonary:     Breath sounds: Normal breath sounds. No wheezing or rales.  Abdominal:     General: Bowel sounds are normal. There is no distension.     Palpations: Abdomen is soft. There is no mass.     Tenderness: There is no abdominal tenderness.     Hernia: There is no hernia in the right inguinal area or left inguinal  area.  Genitourinary:    Penis: Normal.      Testes: Normal.  Musculoskeletal:        General: No edema. Normal range of motion.     Cervical back: Normal range of motion.  Lymphadenopathy:     Cervical: No cervical adenopathy.  Skin:    General: Skin is warm and dry.  Neurological:     Mental Status: He is alert and oriented to person, place, and time.  Psychiatric:        Mood and Affect: Mood and affect normal.       Assessment & Plan:   Hoby was seen today for annual exam.  Diagnoses and all orders for this visit:  Well adult exam  Essential hypertension -     CBC with Differential/Platelet -     CMP14+EGFR -     Lipid panel  Type 2 diabetes mellitus without complication, without long-term current use of insulin (HCC) -     Bayer DCA Hb A1c Waived -     Microalbumin / creatinine urine ratio -     CBC with Differential/Platelet -     CMP14+EGFR -     Lipid panel  Mixed hyperlipidemia -     CBC with Differential/Platelet -     CMP14+EGFR -     Lipid panel  Screening for prostate cancer -     PSA, total and free  Vitamin D deficiency -     VITAMIN D 25 Hydroxy (Vit-D Deficiency, Fractures)  Vitamin B 12 deficiency -     Vitamin B12  Screen for colon cancer -     Ambulatory referral to Gastroenterology  Other orders -     blood glucose meter kit and supplies KIT; Dispense based on patient and insurance preference. Use up to four times daily as directed. (FOR ICD-10 : E11.9 -     metFORMIN (GLUCOPHAGE-XR) 750 MG 24 hr tablet; Take 1 tablet (750 mg total) by mouth daily with breakfast.       I have discontinued Verle L. Lentsch's glipiZIDE. I am also having him start on blood glucose meter kit and supplies and metFORMIN. Additionally, I am having him maintain his aspirin EC, Multiple Vitamins-Minerals (ONE-A-DAY MENS HEALTH FORMULA PO), lidocaine-prilocaine, vitamin C, Multiple Vitamins-Minerals (ZINC PO), VITAMIN D PO, and amLODipine.  Allergies as of  03/26/2020      Reactions   Bleomycin Other (See Comments)   Cramping all over      Medication List       Accurate as of March 26, 2020  3:09 PM. If you have any questions, ask your nurse or doctor.        STOP taking these medications   glipiZIDE 5 MG 24 hr tablet Commonly known as: GLUCOTROL XL Stopped by: [name], MD     TAKE these medications   amLODipine 10 MG tablet Commonly known as: NORVASC TAKE 1 TABLET BY MOUTH EVERY DAY (NEEDS TO BE SEEN BEFORE NEXT REFILL)   aspirin EC  81 MG tablet Take 81 mg by mouth daily.   blood glucose meter kit and supplies Kit Dispense based on patient and insurance preference. Use up to four times daily as directed. (FOR ICD-10 : E11.9 Started by: [name], MD   lidocaine-prilocaine cream Commonly known as: EMLA Apply pea-sized amount to port a cath site and cover with plastic wrap one hour prior to each chemotherapy appointment   metFORMIN 750 MG 24 hr tablet Commonly known as: GLUCOPHAGE-XR Take 1 tablet (750 mg total) by mouth daily with breakfast. Started by: [name], MD   ONE-A-DAY MENS HEALTH FORMULA PO Take 1 tablet by mouth daily.   vitamin C 1000 MG tablet Take 1,000 mg by mouth daily. Vitamin c lozenge   VITAMIN D PO Take by mouth once.   ZINC PO Take by mouth once.        Follow-up: Return in about 3 months (around 06/23/2020).  [name], M.D.

## 2020-03-26 NOTE — Addendum Note (Signed)
Addended by: [name] on: 03/26/2020 03:09 PM   Modules accepted: Orders

## 2020-03-27 ENCOUNTER — Other Ambulatory Visit: Payer: Self-pay | Admitting: Family Medicine

## 2020-03-27 LAB — CMP14+EGFR
ALT: 24 IU/L (ref 0–44)
AST: 22 IU/L (ref 0–40)
Albumin/Globulin Ratio: 2 (ref 1.2–2.2)
Albumin: 4.7 g/dL (ref 3.8–4.9)
Alkaline Phosphatase: 148 IU/L — ABNORMAL HIGH (ref 44–121)
BUN/Creatinine Ratio: 10 (ref 9–20)
BUN: 11 mg/dL (ref 6–24)
Bilirubin Total: 0.4 mg/dL (ref 0.0–1.2)
CO2: 21 mmol/L (ref 20–29)
Calcium: 9.5 mg/dL (ref 8.7–10.2)
Chloride: 99 mmol/L (ref 96–106)
Creatinine, Ser: 1.09 mg/dL (ref 0.76–1.27)
GFR calc Af Amer: 90 mL/min/{1.73_m2} (ref >59)
GFR calc non Af Amer: 78 mL/min/{1.73_m2} (ref >59)
Globulin, Total: 2.4 g/dL (ref 1.5–4.5)
Glucose: 98 mg/dL (ref 65–99)
Potassium: 3.8 mmol/L (ref 3.5–5.2)
Sodium: 145 mmol/L — ABNORMAL HIGH (ref 134–144)
Total Protein: 7.1 g/dL (ref 6.0–8.5)

## 2020-03-27 LAB — LIPID PANEL
Chol/HDL Ratio: 4.2 ratio (ref 0.0–5.0)
Cholesterol, Total: 171 mg/dL (ref 100–199)
HDL: 41 mg/dL (ref >39)
LDL Chol Calc (NIH): 104 mg/dL — ABNORMAL HIGH (ref 0–99)
Triglycerides: 148 mg/dL (ref 0–149)
VLDL Cholesterol Cal: 26 mg/dL (ref 5–40)

## 2020-03-27 LAB — VITAMIN D 25 HYDROXY (VIT D DEFICIENCY, FRACTURES): Vit D, 25-Hydroxy: 41.5 ng/mL (ref 30.0–100.0)

## 2020-03-27 LAB — CBC WITH DIFFERENTIAL/PLATELET
Basophils Absolute: 0 10*3/uL (ref 0.0–0.2)
Basos: 1 %
EOS (ABSOLUTE): 0.1 10*3/uL (ref 0.0–0.4)
Eos: 3 %
Hematocrit: 46.4 % (ref 37.5–51.0)
Hemoglobin: 15.2 g/dL (ref 13.0–17.7)
Immature Grans (Abs): 0 10*3/uL (ref 0.0–0.1)
Immature Granulocytes: 0 %
Lymphocytes Absolute: 1.7 10*3/uL (ref 0.7–3.1)
Lymphs: 45 %
MCH: 27.4 pg (ref 26.6–33.0)
MCHC: 32.8 g/dL (ref 31.5–35.7)
MCV: 84 fL (ref 79–97)
Monocytes Absolute: 0.7 10*3/uL (ref 0.1–0.9)
Monocytes: 18 %
Neutrophils Absolute: 1.2 10*3/uL — ABNORMAL LOW (ref 1.4–7.0)
Neutrophils: 33 %
Platelets: 263 10*3/uL (ref 150–450)
RBC: 5.55 x10E6/uL (ref 4.14–5.80)
RDW: 14.7 % (ref 11.6–15.4)
WBC: 3.7 10*3/uL (ref 3.4–10.8)

## 2020-03-27 LAB — PSA, TOTAL AND FREE
PSA, Free Pct: 65 %
PSA, Free: 0.26 ng/mL
Prostate Specific Ag, Serum: 0.4 ng/mL (ref 0.0–4.0)

## 2020-03-27 LAB — VITAMIN B12: Vitamin B-12: 718 pg/mL (ref 232–1245)

## 2020-03-27 MED ORDER — ROSUVASTATIN CALCIUM 10 MG PO TABS: 10.0000 mg | ORAL_TABLET | Freq: Every day | ORAL | 1 refills | Status: DC

## 2020-03-29 ENCOUNTER — Encounter (INDEPENDENT_AMBULATORY_CARE_PROVIDER_SITE_OTHER): Payer: Self-pay | Admitting: *Deleted

## 2020-04-02 ENCOUNTER — Inpatient Hospital Stay (HOSPITAL_BASED_OUTPATIENT_CLINIC_OR_DEPARTMENT_OTHER): Payer: 59 | Admitting: Hematology

## 2020-04-02 ENCOUNTER — Other Ambulatory Visit: Payer: Self-pay

## 2020-04-02 VITALS — BP 123/84 | HR 82 | Temp 97.2°F | Resp 19 | Wt 243.7 lb

## 2020-04-02 DIAGNOSIS — C8108 Nodular lymphocyte predominant Hodgkin lymphoma, lymph nodes of multiple sites: Secondary | ICD-10-CM | POA: Diagnosis not present

## 2020-04-02 DIAGNOSIS — Z8571 Personal history of Hodgkin lymphoma: Secondary | ICD-10-CM | POA: Diagnosis not present

## 2020-04-02 NOTE — Patient Instructions (Signed)
[facility] at [facility] Discharge Instructions  You were seen today by Dr. Katragadda. He went over your recent results and scans. Dr. Katragadda will see you back in 4 months for labs and follow up.   Thank you for choosing [facility] at [facility] to provide your oncology and hematology care.  To afford each patient quality time with our provider, please arrive at least 15 minutes before your scheduled appointment time.   If you have a lab appointment with the Cancer Center please come in thru the Main Entrance and check in at the main information desk  You need to re-schedule your appointment should you arrive 10 or more minutes late.  We strive to give you quality time with our providers, and arriving late affects you and other patients whose appointments are after yours.  Also, if you no show three or more times for appointments you may be dismissed from the clinic at the providers discretion.     Again, thank you for choosing [facility].  Our hope is that these requests will decrease the amount of time that you wait before being seen by our physicians.       _____________________________________________________________  Should you have questions after your visit to [facility], please contact our office at [phone] between the hours of 8:00 a.m. and 4:30 p.m.  Voicemails left after 4:00 p.m. will not be returned until the following business day.  For prescription refill requests, have your pharmacy contact our office and allow 72 hours.    Cancer Center Support Programs:   > Cancer Support Group  2nd Tuesday of the month 1pm-2pm, Journey Room   

## 2020-04-02 NOTE — Progress Notes (Signed)
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 R-CHOP x 6 cycles from 05/24/2018 to 09/14/2018. 2. XRT to right axillary lymph nodes around 12/2018.  CURRENT THERAPY: Surveillance  INTERVAL HISTORY:  Devin Tran, a 55 y.o. 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 Devin Tran was last seen on 12/22/2019.  Today he reports feeling well. He was started on Crestor for his cholesterol and his glipizide was changed to metformin. Hie energy levels are good although he has been working more overtime recently.  He continues working as a Programmer, systems.    REVIEW OF SYSTEMS:  Review of Systems  Constitutional: Positive for fatigue (90%). Negative for appetite change.  All other systems reviewed and are negative.   PAST MEDICAL/SURGICAL HISTORY:  Past Medical History:  Diagnosis Date  . Complication of anesthesia   . Erectile dysfunction   . Hodgkin's lymphoma ([place])   . Hyperlipidemia   . Hypertension   . Infertility   . Low testosterone   . PONV (postoperative nausea and vomiting)    Past Surgical History:  Procedure Laterality Date  . AXILLARY LYMPH NODE BIOPSY Right 05/10/2018   Procedure: AXILLARY LYMPH NODE BIOPSY, RIGHT;  Surgeon: Aviva Signs, MD;  Location: [facility];  Service: General;  Laterality: Right;  . IR LYMPHANGIOGRAM EXTREMITY UNI LEFT  03/02/1987  . PORTA CATH INSERTION    . PORTA CATH REMOVAL    . PORTACATH PLACEMENT Left 05/21/2018   Procedure: INSERTION PORT-A-CATH (attached catheter in left subclavian);  Surgeon: Aviva Signs, MD;  Location: [facility];  Service: General;  Laterality: Left;  . SPLENECTOMY Left Over 20 years ago   Due to Hodgkin's  lymphoma    SOCIAL HISTORY:  Social History   Socioeconomic History  . Marital status: Single    Spouse name: Not on file  . Number of children: Not on file  . Years of education: Not on file  . Highest education level: Not on file  Occupational History  . Occupation: Drive Chief Operating Officer: [employer]  Tobacco Use  . Smoking status: Never Smoker  . Smokeless tobacco: Never Used  Vaping Use  . Vaping Use: Not on file  Substance and Sexual Activity  . Alcohol use: No  . Drug use: No  . Sexual activity: Yes  Other Topics Concern  . Not on file  Social History Narrative  . Not on file   Social Determinants of Health   Financial Resource Strain: Not on file  Food Insecurity: Not on file  Transportation Needs: No Transportation Needs  . Lack of Transportation (Medical): No  . Lack of Transportation (Non-Medical): No  Physical Activity: Inactive  . Days of Exercise per Week: 0 days  . Minutes of Exercise per Session: 0 Devin Tran  Stress: Not on file  Social Connections: Not on file  Intimate Partner Violence: Not At Risk  . Fear of Current or Ex-Partner: No  . Emotionally Abused: No  . Physically Abused: No  . Sexually Abused: No    FAMILY HISTORY:  Family History  Problem Relation Age of Onset  . Hypertension Mother   . Cancer Father        lung  .  Hypertension Father   . Diabetes Father   . COPD Father   . Hypertension Brother   . Stroke Brother     CURRENT MEDICATIONS:  Current Outpatient Medications  Medication Sig Dispense Refill  . ACCU-CHEK GUIDE test strip     . Accu-Chek Softclix Lancets lancets SMARTSIG:2 Topical Twice Daily PRN    . amLODipine (NORVASC) 10 MG tablet TAKE 1 TABLET BY MOUTH EVERY DAY (NEEDS TO BE SEEN BEFORE NEXT REFILL) 30 tablet 0  . Ascorbic Acid (VITAMIN C) 1000 MG tablet Take 1,000 mg by mouth daily. Vitamin c lozenge    . aspirin EC 81 MG tablet Take 81 mg by mouth daily.     . blood glucose meter kit and supplies  KIT Dispense based on patient and insurance preference. Use up to four times daily as directed. (FOR ICD-10 : E11.9 1 each 0  . lidocaine-prilocaine (EMLA) cream Apply pea-sized amount to port a cath site and cover with plastic wrap one hour prior to each chemotherapy appointment 30 g 3  . metFORMIN (GLUCOPHAGE-XR) 750 MG 24 hr tablet Take 1 tablet (750 mg total) by mouth daily with breakfast. 90 tablet 1  . Multiple Vitamins-Minerals (ONE-A-DAY MENS HEALTH FORMULA PO) Take 1 tablet by mouth daily.     . Multiple Vitamins-Minerals (ZINC PO) Take by mouth once.    . rosuvastatin (CRESTOR) 10 MG tablet Take 1 tablet (10 mg total) by mouth daily. For cholesterol 90 tablet 1  . VITAMIN D PO Take by mouth once.     No current facility-administered medications for this visit.    ALLERGIES:  Allergies  Allergen Reactions  . Bleomycin Other (See Comments)    Cramping all over    PHYSICAL EXAM:  Performance status (ECOG): 1 - Symptomatic but completely ambulatory  Vitals:   04/02/20 1602  BP: 123/84  Pulse: 82  Resp: 19  Temp: (!) 97.2 F (36.2 C)  SpO2: 97%   Wt Readings from Last 3 Encounters:  04/02/20 243 lb 11.2 oz (110.5 kg)  03/26/20 243 lb 2 oz (110.3 kg)  03/23/20 243 lb 3.2 oz (110.3 kg)   Physical Exam Vitals reviewed.  Constitutional:      Appearance: Normal appearance. He is obese.  Cardiovascular:     Rate and Rhythm: Normal rate and regular rhythm.     Pulses: Normal pulses.     Heart sounds: Normal heart sounds.  Pulmonary:     Effort: Pulmonary effort is normal.     Breath sounds: Normal breath sounds.  Chest:  Breasts:     Right: No axillary adenopathy or supraclavicular adenopathy.     Left: No axillary adenopathy or supraclavicular adenopathy.    Abdominal:     Palpations: Abdomen is soft. There is no hepatomegaly, splenomegaly or mass.     Tenderness: There is no abdominal tenderness.     Hernia: No hernia is present.  Musculoskeletal:     Right  lower leg: No edema.     Left lower leg: No edema.  Lymphadenopathy:     Cervical: No cervical adenopathy.     Upper Body:     Right upper body: No supraclavicular, axillary or pectoral adenopathy.     Left upper body: No supraclavicular, axillary or pectoral adenopathy.  Neurological:     General: No focal deficit present.     Mental Status: He is alert and oriented to person, place, and time.  Psychiatric:        Mood and  Affect: Mood normal.        Behavior: Behavior normal.     LABORATORY DATA:  I have reviewed the labs as listed.  CBC Latest Ref Rng & Units 03/26/2020 03/23/2020 12/15/2019  WBC 3.4 - 10.8 x10E3/uL 3.7 2.7(L) 2.9(L)  Hemoglobin 13.0 - 17.7 g/dL 15.2 14.2 14.1  Hematocrit 37.5 - 51.0 % 46.4 44.0 43.7  Platelets 150 - 450 x10E3/uL 263 221 219   CMP Latest Ref Rng & Units 03/26/2020 03/23/2020 12/15/2019  Glucose 65 - 99 mg/dL 98 150(H) 134(H)  BUN 6 - 24 mg/dL '11 15 16  ' Creatinine 0.76 - 1.27 mg/dL 1.09 1.06 1.12  Sodium 134 - 144 mmol/L 145(H) 139 141  Potassium 3.5 - 5.2 mmol/L 3.8 3.6 3.6  Chloride 96 - 106 mmol/L 99 108 106  CO2 20 - 29 mmol/L '21 25 24  ' Calcium 8.7 - 10.2 mg/dL 9.5 9.1 9.1  Total Protein 6.0 - 8.5 g/dL 7.1 6.7 7.2  Total Bilirubin 0.0 - 1.2 mg/dL 0.4 0.9 0.5  Alkaline Phos 44 - 121 IU/L 148(H) 65 66  AST 0 - 40 IU/L '22 25 24  ' ALT 0 - 44 IU/L '24 31 28      ' Component Value Date/Time   RBC 5.55 03/26/2020 1406   RBC 5.16 03/23/2020 0911   MCV 84 03/26/2020 1406   MCH 27.4 03/26/2020 1406   MCH 27.5 03/23/2020 0911   MCHC 32.8 03/26/2020 1406   MCHC 32.3 03/23/2020 0911   RDW 14.7 03/26/2020 1406   LYMPHSABS 1.7 03/26/2020 1406   MONOABS 0.4 03/23/2020 0911   EOSABS 0.1 03/26/2020 1406   BASOSABS 0.0 03/26/2020 1406   Lab Results  Component Value Date   LDH 167 03/23/2020   LDH 185 12/15/2019   LDH 169 09/19/2019   Lab Results  Component Value Date   VD25OH 41.5 03/26/2020   VD25OH 27.5 (L) 07/13/2017   VD25OH 26.4 (L)  09/28/2014   PSA 0.4 03/26/2020  PSA 0.5 07/13/2017  PSA 0.5 11/11/2016    DIAGNOSTIC IMAGING:  I have independently reviewed the scans and discussed with the patient. CT CHEST ABDOMEN PELVIS W CONTRAST  Result Date: 03/27/2020 CLINICAL DATA:  Follow-up of Hodgkin's lymphoma. Status post chemotherapy. History of diabetes. Asymptomatic. EXAM: CT CHEST, ABDOMEN, AND PELVIS WITH CONTRAST TECHNIQUE: Multidetector CT imaging of the chest, abdomen and pelvis was performed following the standard protocol during bolus administration of intravenous contrast. CONTRAST:  148m OMNIPAQUE IOHEXOL 300 MG/ML  SOLN COMPARISON:  09/19/2019 FINDINGS: CT CHEST FINDINGS Cardiovascular: Left Port-A-Cath tip mid SVC. Normal aortic caliber. Normal heart size, without pericardial effusion. Mediastinum/Nodes: No supraclavicular adenopathy. Right axillary node dissection. Small right subpectoral nodes are unchanged. Multiple small right axillary nodes are similar in size and number. Dominant right axillary node measures maximally 2.4 x 1.6 cm on 15/2 versus 2.5 x 1.4 cm on the prior exam (when remeasured). 3.5 cm craniocaudal on coronal image 75 today versus 3.8 cm on the prior. No left axillary adenopathy.  No mediastinal or hilar adenopathy. Lungs/Pleura: No pleural fluid. Clear lungs. Previously described right upper lobe ground-glass opacities have resolved and were likely infectious/inflammatory. Musculoskeletal: No acute osseous abnormality. CT ABDOMEN PELVIS FINDINGS Hepatobiliary: Vague right hepatic dome focus of hyperenhancement measures 1.6 cm on 47/2 and is similar to on the prior exam. Normal gallbladder, without biliary ductal dilatation. Surgical clips in the porta hepatis. Pancreas: Normal, without mass or ductal dilatation. Spleen: Splenectomy. Adrenals/Urinary Tract: Normal adrenal glands. Lower pole right renal 3.3  cm cyst. A lower pole left renal 8.7 cm lesion measures on the order of 19 HU, likely a cyst or  minimally complex cyst. Other bilateral renal lesions are too small to characterize. No hydronephrosis. Normal urinary bladder. Stomach/Bowel: Normal stomach, without wall thickening. Normal colon, appendix, and terminal ileum. Normal small bowel. Vascular/Lymphatic: Aortic atherosclerosis. No abdominopelvic adenopathy. Reproductive: Normal prostate. Other: No significant free fluid. No evidence of omental or peritoneal disease. Multiple tiny fat containing periumbilical ventral abdominal wall hernias. Musculoskeletal: Presumably degenerative partial sacroiliac joint fusion bilaterally. L5 bone island. Degenerative disc disease at the lumbosacral junction IMPRESSION: 1. Right axillary adenopathy, similar to minimally improved. 2. No new sites of disease to suggest progressive lymphoma. 3. Vague area of right hepatic lobe hyperenhancement, similar and likely a perfusion anomaly or hemangioma. Electronically Signed   By: Abigail Miyamoto M.D.   On: 03/27/2020 09:01     ASSESSMENT:  1. NLP Hodgkin's lymphoma, recurrent 3B: -6 cycles of R-CHOP from 05/24/2018 through 09/14/2018. -XRT to the right axillary lymph node completed around November 2020. -PET scan on 03/21/2019 showed right axillary and subpectoral lymph nodes with the largest right axillary lymph node measuring 1.7 cm, [place] 2. -CT CAP on 09/19/2019 showed patchy bilateral groundglass opacities. Mildly enlarged right axillary nodes largest measuring 13 x 26 mm unchanged since September 2020. Subpectoral lymph nodes in the right chest and high right axillary lymph nodes without change. No adenopathy in the abdomen or pelvis.  2. Diabetes: -He is taking glipizide XR 5 mg daily. -We will check hemoglobin A1c next visit.  3. Post splenectomy state: -Received Menactra and Pneumovax on 05/25/2018.   PLAN:  1. NLP Hodgkin's lymphoma, recurrent 3B: -No B symptoms.  No palpable adenopathy. -Reviewed CT CAP from 03/26/2020 which showed stable  right axillary adenopathy measuring 2.4 x 1.6 cm.  No new sites to suggest lymphoma.  Vague area of right hepatic lobe hyperenhancement, similar to prior scan, likely from perfusion anomaly. -Reviewed labs from 03/26/2020 which showed white count improved to 3.7, hemoglobin 15.1 platelet count normal. -Alkaline phosphatase was mildly elevated.  We will closely follow this. -Recommend follow-up in 4 months with labs.  2. Diabetes: -Continue Metformin under the direction of Dr. Livia Snellen.  Orders placed this encounter:  No orders of the defined types were placed in this encounter.    Derek Jack, MD [place] [phone]   I, Milinda Antis, am acting as a scribe for Dr. Sanda Linger.  I, Derek Jack MD, have reviewed the above documentation for accuracy and completeness, and I agree with the above.

## 2020-04-03 ENCOUNTER — Other Ambulatory Visit (HOSPITAL_COMMUNITY): Payer: Self-pay

## 2020-04-03 DIAGNOSIS — C8108 Nodular lymphocyte predominant Hodgkin lymphoma, lymph nodes of multiple sites: Secondary | ICD-10-CM

## 2020-04-10 ENCOUNTER — Other Ambulatory Visit: Payer: Self-pay | Admitting: Family Medicine

## 2020-04-12 ENCOUNTER — Other Ambulatory Visit: Payer: Self-pay | Admitting: Family Medicine

## 2020-05-12 ENCOUNTER — Other Ambulatory Visit: Payer: Self-pay | Admitting: Family Medicine

## 2020-05-12 DIAGNOSIS — E119 Type 2 diabetes mellitus without complications: Secondary | ICD-10-CM

## 2020-06-25 ENCOUNTER — Encounter: Payer: Self-pay | Admitting: Family Medicine

## 2020-06-25 ENCOUNTER — Other Ambulatory Visit: Payer: Self-pay

## 2020-06-25 ENCOUNTER — Ambulatory Visit (INDEPENDENT_AMBULATORY_CARE_PROVIDER_SITE_OTHER): Payer: 59 | Admitting: Family Medicine

## 2020-06-25 VITALS — BP 121/78 | HR 75 | Temp 97.4°F | Ht 73.0 in | Wt 235.2 lb

## 2020-06-25 DIAGNOSIS — E782 Mixed hyperlipidemia: Secondary | ICD-10-CM

## 2020-06-25 DIAGNOSIS — Z1211 Encounter for screening for malignant neoplasm of colon: Secondary | ICD-10-CM | POA: Diagnosis not present

## 2020-06-25 DIAGNOSIS — E119 Type 2 diabetes mellitus without complications: Secondary | ICD-10-CM

## 2020-06-25 LAB — BAYER DCA HB A1C WAIVED: HB A1C (BAYER DCA - WAIVED): 7.1 % — ABNORMAL HIGH (ref <7.0)

## 2020-06-25 NOTE — Progress Notes (Signed)
Subjective:  Patient ID: Devin Tran,  male    DOB: 04-Feb-1966  Age: 55 y.o.    CC: Medical Management of Chronic Issues   HPI Devin Tran presents for  follow-up of hypertension. Patient has no history of headache chest pain or shortness of breath or recent cough. Patient also denies symptoms of TIA such as numbness weakness lateralizing. Patient denies side effects from medication. States taking it regularly.  Patient also  in for follow-up of elevated cholesterol. Doing well without complaints on current medication. Denies side effects  including myalgia and arthralgia and nausea. Also in today for liver function testing. Currently no chest pain, shortness of breath or other cardiovascular related symptoms noted.  Follow-up of diabetes. Patient does check blood sugar at home. Readings run between 90 and 140 Patient denies symptoms such as excessive hunger or urinary frequency, excessive hunger, nausea No significant hypoglycemic spells noted. Medications reviewed. Pt reports taking them regularly. Pt. denies complication/adverse reaction today.    History Devin Tran has a past medical history of Complication of anesthesia, Erectile dysfunction, Hodgkin's lymphoma ([place]), Hyperlipidemia, Hypertension, Infertility, Low testosterone, and PONV (postoperative nausea and vomiting).   He has a past surgical history that includes Splenectomy (Left, Over 20 years ago); PORTA CATH INSERTION; PORTA CATH REMOVAL; Axillary lymph node biopsy (Right, 05/10/2018); Portacath placement (Left, 05/21/2018); and IR LYMPHANGIOGRAM EXTREMITY UNI LEFT (03/02/1987).   His family history includes COPD in his father; Cancer in his father; Diabetes in his father; Hypertension in his brother, father, and mother; Stroke in his brother.He reports that he has never smoked. He has never used smokeless tobacco. He reports that he does not drink alcohol and does not use drugs.  Current Outpatient Medications on File Prior to  Visit  Medication Sig Dispense Refill  . Accu-Chek Softclix Lancets lancets SMARTSIG:2 Topical Twice Daily PRN    . amLODipine (NORVASC) 10 MG tablet TAKE 1 TABLET BY MOUTH EVERY DAY 90 tablet 1  . Ascorbic Acid (VITAMIN C) 1000 MG tablet Take 1,000 mg by mouth daily. Vitamin c lozenge    . aspirin EC 81 MG tablet Take 81 mg by mouth daily.     . blood glucose meter kit and supplies KIT Dispense based on patient and insurance preference. Use up to four times daily as directed. (FOR ICD-10 : E11.9 1 each 0  . glucose blood (ACCU-CHEK GUIDE) test strip CHECK SUGAR UP TO 4 TIMES DAILY Dx E11.9 400 strip 3  . lidocaine-prilocaine (EMLA) cream Apply pea-sized amount to port a cath site and cover with plastic wrap one hour prior to each chemotherapy appointment 30 g 3  . metFORMIN (GLUCOPHAGE-XR) 750 MG 24 hr tablet Take 1 tablet (750 mg total) by mouth daily with breakfast. 90 tablet 1  . Multiple Vitamins-Minerals (ONE-A-DAY MENS HEALTH FORMULA PO) Take 1 tablet by mouth daily.     . Multiple Vitamins-Minerals (ZINC PO) Take by mouth once.    . rosuvastatin (CRESTOR) 10 MG tablet Take 1 tablet (10 mg total) by mouth daily. For cholesterol 90 tablet 1  . VITAMIN D PO Take by mouth once.     No current facility-administered medications on file prior to visit.    ROS Review of Systems  Constitutional: Negative for fever.  Respiratory: Negative for shortness of breath.   Cardiovascular: Negative for chest pain.  Musculoskeletal: Negative for arthralgias.  Skin: Negative for rash.    Objective:  BP 121/78   Pulse 75   Temp Marland Kitchen)  97.4 F (36.3 C)   Ht _0  (1.854 m)   Wt 235 lb 3.2 oz (106.7 kg)   SpO2 100%   BMI 31.03 kg/m   BP Readings from Last 3 Encounters:  06/25/20 121/78  04/02/20 123/84  03/26/20 136/86    Wt Readings from Last 3 Encounters:  06/25/20 235 lb 3.2 oz (106.7 kg)  04/02/20 243 lb 11.2 oz (110.5 kg)  03/26/20 243 lb 2 oz (110.3 kg)     Physical Exam Vitals  reviewed.  Constitutional:      Appearance: He is well-developed.  HENT:     Head: Normocephalic and atraumatic.     Right Ear: External ear normal.     Left Ear: External ear normal.     Mouth/Throat:     Pharynx: No oropharyngeal exudate or posterior oropharyngeal erythema.  Eyes:     Pupils: Pupils are equal, round, and reactive to light.  Cardiovascular:     Rate and Rhythm: Normal rate and regular rhythm.     Heart sounds: No murmur heard.   Pulmonary:     Effort: No respiratory distress.     Breath sounds: Normal breath sounds.  Musculoskeletal:     Cervical back: Normal range of motion and neck supple.  Neurological:     Mental Status: He is alert and oriented to person, place, and time.       Assessment & Plan:   Devin Tran was seen today for medical management of chronic issues.  Diagnoses and all orders for this visit:  Type 2 diabetes mellitus without complication, without long-term current use of insulin ([place]) -     Bayer DCA Hb A1c Waived -     CBC with Differential/Platelet -     CMP14+EGFR -     Microalbumin / creatinine urine ratio  Mixed hyperlipidemia -     Lipid panel   I am having Devin Tran maintain his aspirin EC, Multiple Vitamins-Minerals (ONE-A-DAY MENS HEALTH FORMULA PO), lidocaine-prilocaine, vitamin C, Multiple Vitamins-Minerals (ZINC PO), VITAMIN D PO, blood glucose meter kit and supplies, metFORMIN, rosuvastatin, Accu-Chek Softclix Lancets, amLODipine, and Accu-Chek Guide.  No orders of the defined types were placed in this encounter.    Follow-up: No follow-ups on file.  Devin Tran, M.D.

## 2020-06-26 ENCOUNTER — Encounter (HOSPITAL_COMMUNITY): Payer: Self-pay | Admitting: Hematology

## 2020-06-26 LAB — CMP14+EGFR
ALT: 27 IU/L (ref 0–44)
AST: 20 IU/L (ref 0–40)
Albumin/Globulin Ratio: 2.4 — ABNORMAL HIGH (ref 1.2–2.2)
Albumin: 5 g/dL — ABNORMAL HIGH (ref 3.8–4.9)
Alkaline Phosphatase: 75 IU/L (ref 44–121)
BUN/Creatinine Ratio: 16 (ref 9–20)
BUN: 17 mg/dL (ref 6–24)
Bilirubin Total: 0.3 mg/dL (ref 0.0–1.2)
CO2: 24 mmol/L (ref 20–29)
Calcium: 9.6 mg/dL (ref 8.7–10.2)
Chloride: 105 mmol/L (ref 96–106)
Creatinine, Ser: 1.06 mg/dL (ref 0.76–1.27)
Globulin, Total: 2.1 g/dL (ref 1.5–4.5)
Glucose: 83 mg/dL (ref 65–99)
Potassium: 4 mmol/L (ref 3.5–5.2)
Sodium: 144 mmol/L (ref 134–144)
Total Protein: 7.1 g/dL (ref 6.0–8.5)
eGFR: 84 mL/min/{1.73_m2} (ref >59)

## 2020-06-26 LAB — CBC WITH DIFFERENTIAL/PLATELET
Basophils Absolute: 0 10*3/uL (ref 0.0–0.2)
Basos: 1 %
EOS (ABSOLUTE): 0.1 10*3/uL (ref 0.0–0.4)
Eos: 2 %
Hematocrit: 43.4 % (ref 37.5–51.0)
Hemoglobin: 14.3 g/dL (ref 13.0–17.7)
Immature Grans (Abs): 0 10*3/uL (ref 0.0–0.1)
Immature Granulocytes: 0 %
Lymphocytes Absolute: 1.9 10*3/uL (ref 0.7–3.1)
Lymphs: 46 %
MCH: 28.1 pg (ref 26.6–33.0)
MCHC: 32.9 g/dL (ref 31.5–35.7)
MCV: 85 fL (ref 79–97)
Monocytes Absolute: 0.7 10*3/uL (ref 0.1–0.9)
Monocytes: 16 %
Neutrophils Absolute: 1.4 10*3/uL (ref 1.4–7.0)
Neutrophils: 35 %
Platelets: 211 10*3/uL (ref 150–450)
RBC: 5.09 x10E6/uL (ref 4.14–5.80)
RDW: 14.5 % (ref 11.6–15.4)
WBC: 4 10*3/uL (ref 3.4–10.8)

## 2020-06-26 LAB — LIPID PANEL
Chol/HDL Ratio: 2.8 ratio (ref 0.0–5.0)
Cholesterol, Total: 110 mg/dL (ref 100–199)
HDL: 40 mg/dL (ref >39)
LDL Chol Calc (NIH): 54 mg/dL (ref 0–99)
Triglycerides: 82 mg/dL (ref 0–149)
VLDL Cholesterol Cal: 16 mg/dL (ref 5–40)

## 2020-06-26 LAB — MICROALBUMIN / CREATININE URINE RATIO
Creatinine, Urine: 158.7 mg/dL
Microalb/Creat Ratio: 6 mg/g creat (ref 0–29)
Microalbumin, Urine: 10.2 ug/mL

## 2020-06-26 NOTE — Progress Notes (Signed)
Hello [place],  Your lab result is normal and/or stable.Some minor variations that are not significant are commonly marked abnormal, but do not represent any medical problem for you.  Best regards, Myron Stankovich, M.D.

## 2020-06-29 ENCOUNTER — Inpatient Hospital Stay (HOSPITAL_COMMUNITY): Payer: 59 | Attending: Hematology

## 2020-06-29 ENCOUNTER — Other Ambulatory Visit: Payer: Self-pay

## 2020-06-29 DIAGNOSIS — Z452 Encounter for adjustment and management of vascular access device: Secondary | ICD-10-CM | POA: Diagnosis not present

## 2020-06-29 DIAGNOSIS — Z8571 Personal history of Hodgkin lymphoma: Secondary | ICD-10-CM | POA: Diagnosis present

## 2020-06-29 MED ORDER — HEPARIN SOD (PORK) LOCK FLUSH 100 UNIT/ML IV SOLN
500.0000 [IU] | Freq: Once | INTRAVENOUS | Status: AC
  Administered 2020-06-29: 500 [IU] via INTRAVENOUS

## 2020-06-29 MED ORDER — SODIUM CHLORIDE 0.9% FLUSH
10.0000 mL | Freq: Once | INTRAVENOUS | Status: AC
  Administered 2020-06-29: 10 mL via INTRAVENOUS

## 2020-06-29 NOTE — Progress Notes (Signed)
Patients port flushed without difficulty.  No blood return noted with no bruising or swelling noted at site.  Stable during access and flush.  Band aid applied.  VSS with discharge and left in satisfactory condition with no s/s of distress noted.

## 2020-06-29 NOTE — Patient Instructions (Signed)
[facility]  Discharge Instructions: Thank you for choosing [facility] to provide your oncology and hematology care.  If you have a lab appointment with the Cancer Center, please come in thru the Main Entrance and check in at the main information desk.  Wear comfortable clothing and clothing appropriate for easy access to any Portacath or PICC line.   We strive to give you quality time with your provider. You may need to reschedule your appointment if you arrive late (15 or more minutes).  Arriving late affects you and other patients whose appointments are after yours.  Also, if you miss three or more appointments without notifying the office, you may be dismissed from the clinic at the provider's discretion.      For prescription refill requests, have your pharmacy contact our office and allow 72 hours for refills to be completed.        To help prevent nausea and vomiting after your treatment, we encourage you to take your nausea medication as directed.  BELOW ARE SYMPTOMS THAT SHOULD BE REPORTED IMMEDIATELY: *FEVER GREATER THAN 100.4 F (38 C) OR HIGHER *CHILLS OR SWEATING *NAUSEA AND VOMITING THAT IS NOT CONTROLLED WITH YOUR NAUSEA MEDICATION *UNUSUAL SHORTNESS OF BREATH *UNUSUAL BRUISING OR BLEEDING *URINARY PROBLEMS (pain or burning when urinating, or frequent urination) *BOWEL PROBLEMS (unusual diarrhea, constipation, pain near the anus) TENDERNESS IN MOUTH AND THROAT WITH OR WITHOUT PRESENCE OF ULCERS (sore throat, sores in mouth, or a toothache) UNUSUAL RASH, SWELLING OR PAIN  UNUSUAL VAGINAL DISCHARGE OR ITCHING   Items with * indicate a potential emergency and should be followed up as soon as possible or go to the Emergency Department if any problems should occur.  Please show the CHEMOTHERAPY ALERT CARD or IMMUNOTHERAPY ALERT CARD at check-in to the Emergency Department and triage nurse.  Should you have questions after your visit or need to cancel  or reschedule your appointment, please contact [facility] [phone]  and follow the prompts.  Office hours are 8:00 a.m. to 4:30 p.m. Monday - Friday. Please note that voicemails left after 4:00 p.m. may not be returned until the following business day.  We are closed weekends and major holidays. You have access to a nurse at all times for urgent questions. Please call the main number to the clinic [phone] and follow the prompts.  For any non-urgent questions, you may also contact your provider using MyChart. We now offer e-Visits for anyone 18 and older to request care online for non-urgent symptoms. For details visit [email or web address].   Also download the MyChart app! Go to the app store, search "MyChart", open the app, select [facility], and log in with your MyChart username and password.  Due to Covid, a mask is required upon entering the hospital/clinic. If you do not have a mask, one will be given to you upon arrival. For doctor visits, patients may have 1 support person aged 18 or older with them. For treatment visits, patients cannot have anyone with them due to current Covid guidelines and our immunocompromised population.  

## 2020-07-11 ENCOUNTER — Encounter: Payer: Self-pay | Admitting: Gastroenterology

## 2020-07-23 ENCOUNTER — Inpatient Hospital Stay (HOSPITAL_COMMUNITY): Payer: 59 | Attending: Hematology

## 2020-07-23 ENCOUNTER — Other Ambulatory Visit: Payer: Self-pay

## 2020-07-23 DIAGNOSIS — Z8572 Personal history of non-Hodgkin lymphomas: Secondary | ICD-10-CM | POA: Diagnosis present

## 2020-07-23 DIAGNOSIS — Z7984 Long term (current) use of oral hypoglycemic drugs: Secondary | ICD-10-CM | POA: Diagnosis not present

## 2020-07-23 DIAGNOSIS — C8108 Nodular lymphocyte predominant Hodgkin lymphoma, lymph nodes of multiple sites: Secondary | ICD-10-CM

## 2020-07-23 DIAGNOSIS — Z9081 Acquired absence of spleen: Secondary | ICD-10-CM | POA: Diagnosis not present

## 2020-07-23 DIAGNOSIS — E785 Hyperlipidemia, unspecified: Secondary | ICD-10-CM | POA: Insufficient documentation

## 2020-07-23 DIAGNOSIS — Z7982 Long term (current) use of aspirin: Secondary | ICD-10-CM | POA: Insufficient documentation

## 2020-07-23 DIAGNOSIS — I10 Essential (primary) hypertension: Secondary | ICD-10-CM | POA: Diagnosis not present

## 2020-07-23 DIAGNOSIS — Z79899 Other long term (current) drug therapy: Secondary | ICD-10-CM | POA: Insufficient documentation

## 2020-07-23 DIAGNOSIS — E119 Type 2 diabetes mellitus without complications: Secondary | ICD-10-CM | POA: Insufficient documentation

## 2020-07-23 LAB — CBC WITH DIFFERENTIAL/PLATELET
Abs Immature Granulocytes: 0 10*3/uL (ref 0.00–0.07)
Basophils Absolute: 0 10*3/uL (ref 0.0–0.1)
Basophils Relative: 1 %
Eosinophils Absolute: 0.1 10*3/uL (ref 0.0–0.5)
Eosinophils Relative: 2 %
HCT: 46.1 % (ref 39.0–52.0)
Hemoglobin: 14.9 g/dL (ref 13.0–17.0)
Immature Granulocytes: 0 %
Lymphocytes Relative: 45 %
Lymphs Abs: 1.4 10*3/uL (ref 0.7–4.0)
MCH: 28 pg (ref 26.0–34.0)
MCHC: 32.3 g/dL (ref 30.0–36.0)
MCV: 86.7 fL (ref 80.0–100.0)
Monocytes Absolute: 0.5 10*3/uL (ref 0.1–1.0)
Monocytes Relative: 16 %
Neutro Abs: 1.1 10*3/uL — ABNORMAL LOW (ref 1.7–7.7)
Neutrophils Relative %: 36 %
Platelets: 202 10*3/uL (ref 150–400)
RBC: 5.32 MIL/uL (ref 4.22–5.81)
RDW: 14.6 % (ref 11.5–15.5)
WBC: 3.1 10*3/uL — ABNORMAL LOW (ref 4.0–10.5)
nRBC: 0 % (ref 0.0–0.2)

## 2020-07-23 LAB — LACTATE DEHYDROGENASE: LDH: 146 U/L (ref 98–192)

## 2020-07-23 LAB — SEDIMENTATION RATE: Sed Rate: 2 mm/hr (ref 0–16)

## 2020-07-23 LAB — COMPREHENSIVE METABOLIC PANEL
ALT: 25 U/L (ref 0–44)
AST: 20 U/L (ref 15–41)
Albumin: 4.5 g/dL (ref 3.5–5.0)
Alkaline Phosphatase: 62 U/L (ref 38–126)
Anion gap: 7 (ref 5–15)
BUN: 15 mg/dL (ref 6–20)
CO2: 26 mmol/L (ref 22–32)
Calcium: 9.3 mg/dL (ref 8.9–10.3)
Chloride: 107 mmol/L (ref 98–111)
Creatinine, Ser: 1.07 mg/dL (ref 0.61–1.24)
GFR, Estimated: >60 mL/min (ref >60)
Glucose, Bld: 156 mg/dL — ABNORMAL HIGH (ref 70–99)
Potassium: 4 mmol/L (ref 3.5–5.1)
Sodium: 140 mmol/L (ref 135–145)
Total Bilirubin: 0.4 mg/dL (ref 0.3–1.2)
Total Protein: 6.9 g/dL (ref 6.5–8.1)

## 2020-07-24 ENCOUNTER — Other Ambulatory Visit (HOSPITAL_COMMUNITY): Payer: 59

## 2020-07-31 ENCOUNTER — Encounter (HOSPITAL_COMMUNITY): Payer: Self-pay | Admitting: Hematology and Oncology

## 2020-07-31 ENCOUNTER — Inpatient Hospital Stay (HOSPITAL_BASED_OUTPATIENT_CLINIC_OR_DEPARTMENT_OTHER): Payer: 59 | Admitting: Hematology and Oncology

## 2020-07-31 ENCOUNTER — Other Ambulatory Visit: Payer: Self-pay

## 2020-07-31 VITALS — BP 131/86 | HR 76 | Temp 96.9°F | Resp 18 | Wt 231.5 lb

## 2020-07-31 DIAGNOSIS — C8108 Nodular lymphocyte predominant Hodgkin lymphoma, lymph nodes of multiple sites: Secondary | ICD-10-CM

## 2020-07-31 DIAGNOSIS — Z8571 Personal history of Hodgkin lymphoma: Secondary | ICD-10-CM | POA: Diagnosis not present

## 2020-07-31 DIAGNOSIS — Z8572 Personal history of non-Hodgkin lymphomas: Secondary | ICD-10-CM | POA: Diagnosis not present

## 2020-07-31 NOTE — Progress Notes (Signed)
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 R-CHOP x 6 cycles from 05/24/2018 to 09/14/2018. 2. XRT to right axillary lymph nodes around 12/2018.  CURRENT THERAPY: Surveillance  INTERVAL HISTORY:  [name], a 55 y.o. 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 [name] was last seen on 04/02/2020.  On exam today [name] reports that he has been well in the interim since his last visit.  He notes that he does tend to be fatigued fast because he is working overtime and "worn out after work".  He has been doing his best to try to exercise.  He is also concerned about his borderline type 2 diabetes and has been doing his best to lose weight and eat right in order to help prevent this from worsening.  He is also decreased his sugar intake.  He did have COVID approximately 3 weeks ago which he is recovering from well.  He otherwise denies any fevers, chills, sweats, nausea vomiting or diarrhea.  A full 10 point ROS is listed below.  REVIEW OF SYSTEMS:  Review of Systems  Constitutional:  Positive for fatigue (90%). Negative for appetite change.  All other systems reviewed and are negative.  PAST MEDICAL/SURGICAL HISTORY:  Past Medical History:  Diagnosis Date   Complication of anesthesia    Erectile dysfunction    Hodgkin's lymphoma ([place])    Hyperlipidemia    Hypertension    Infertility    Low testosterone    PONV (postoperative nausea and vomiting)    Past Surgical History:  Procedure Laterality Date   AXILLARY LYMPH NODE BIOPSY Right 05/10/2018   Procedure: AXILLARY LYMPH NODE BIOPSY, RIGHT;  Surgeon: Aviva Signs, MD;  Location: [facility];  Service: General;   Laterality: Right;   IR LYMPHANGIOGRAM EXTREMITY UNI LEFT  03/02/1987   PORTA CATH INSERTION     PORTA CATH REMOVAL     PORTACATH PLACEMENT Left 05/21/2018   Procedure: INSERTION PORT-A-CATH (attached catheter in left subclavian);  Surgeon: Aviva Signs, MD;  Location: [facility];  Service: General;  Laterality: Left;   SPLENECTOMY Left Over 20 years ago   Due to Hodgkin's lymphoma    SOCIAL HISTORY:  Social History   Socioeconomic History   Marital status: Single    Spouse name: Not on file   Number of children: Not on file   Years of education: Not on file   Highest education level: Not on file  Occupational History   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    Employer: [employer]  Tobacco Use   Smoking status: Never   Smokeless tobacco: Never  Vaping Use   Vaping Use: Not on file  Substance and Sexual Activity   Alcohol use: No   Drug use: No   Sexual activity: Yes  Other Topics Concern   Not on file  Social History Narrative   Not on file   Social Determinants of Health   Financial Resource Strain: Not on file  Food Insecurity: Not on file  Transportation Needs: No Transportation Needs   Lack of Transportation (Medical): No   Lack of Transportation (Non-Medical): No  Physical Activity: Inactive   Days of Exercise per Week: 0 days  Minutes of Exercise per Session: 0 min  Stress: Not on file  Social Connections: Not on file  Intimate Partner Violence: Not At Risk   Fear of Current or Ex-Partner: No   Emotionally Abused: No   Physically Abused: No   Sexually Abused: No    FAMILY HISTORY:  Family History  Problem Relation Age of Onset   Hypertension Mother    Cancer Father        lung   Hypertension Father    Diabetes Father    COPD Father    Hypertension Brother    Stroke Brother     CURRENT MEDICATIONS:  Current Outpatient Medications  Medication Sig Dispense Refill   Accu-Chek Softclix Lancets lancets SMARTSIG:2 Topical Twice Daily PRN     amLODipine  (NORVASC) 10 MG tablet TAKE 1 TABLET BY MOUTH EVERY DAY 90 tablet 1   Ascorbic Acid (VITAMIN C) 1000 MG tablet Take 1,000 mg by mouth daily. Vitamin c lozenge     aspirin EC 81 MG tablet Take 81 mg by mouth daily.      blood glucose meter kit and supplies KIT Dispense based on patient and insurance preference. Use up to four times daily as directed. (FOR ICD-10 : E11.9 1 each 0   glucose blood (ACCU-CHEK GUIDE) test strip CHECK SUGAR UP TO 4 TIMES DAILY Dx E11.9 400 strip 3   lidocaine-prilocaine (EMLA) cream Apply pea-sized amount to port a cath site and cover with plastic wrap one hour prior to each chemotherapy appointment 30 g 3   metFORMIN (GLUCOPHAGE-XR) 750 MG 24 hr tablet Take 1 tablet (750 mg total) by mouth daily with breakfast. 90 tablet 1   Multiple Vitamins-Minerals (ONE-A-DAY MENS HEALTH FORMULA PO) Take 1 tablet by mouth daily.      Multiple Vitamins-Minerals (ZINC PO) Take by mouth once.     rosuvastatin (CRESTOR) 10 MG tablet Take 1 tablet (10 mg total) by mouth daily. For cholesterol 90 tablet 1   VITAMIN D PO Take by mouth once.     No current facility-administered medications for this visit.    ALLERGIES:  Allergies  Allergen Reactions   Bleomycin Other (See Comments)    Cramping all over    PHYSICAL EXAM:  Performance status (ECOG): 1 - Symptomatic but completely ambulatory  Vitals:   07/31/20 1545  BP: 131/86  Pulse: 76  Resp: 18  Temp: (!) 96.9 F (36.1 C)  SpO2: 97%   Wt Readings from Last 3 Encounters:  07/31/20 231 lb 8 oz (105 kg)  06/25/20 235 lb 3.2 oz (106.7 kg)  04/02/20 243 lb 11.2 oz (110.5 kg)   Physical Exam Vitals reviewed.  Constitutional:      Appearance: Normal appearance. He is obese.  Cardiovascular:     Rate and Rhythm: Normal rate and regular rhythm.     Pulses: Normal pulses.     Heart sounds: Normal heart sounds.  Pulmonary:     Effort: Pulmonary effort is normal.     Breath sounds: Normal breath sounds.  Chest:   Breasts:    Right: No axillary adenopathy or supraclavicular adenopathy.     Left: No axillary adenopathy or supraclavicular adenopathy.  Abdominal:     Palpations: Abdomen is soft. There is no hepatomegaly, splenomegaly or mass.     Tenderness: There is no abdominal tenderness.     Hernia: No hernia is present.  Musculoskeletal:     Right lower leg: No edema.     Left lower leg:  No edema.  Lymphadenopathy:     Cervical: No cervical adenopathy.     Upper Body:     Right upper body: No supraclavicular, axillary or pectoral adenopathy.     Left upper body: No supraclavicular, axillary or pectoral adenopathy.  Neurological:     General: No focal deficit present.     Mental Status: He is alert and oriented to person, place, and Devin.  Psychiatric:        Mood and Affect: Mood normal.        Behavior: Behavior normal.    LABORATORY DATA:  I have reviewed the labs as listed.  CBC Latest Ref Rng & Units 07/23/2020 06/25/2020 03/26/2020  WBC 4.0 - 10.5 K/uL 3.1(L) 4.0 3.7  Hemoglobin 13.0 - 17.0 g/dL 14.9 14.3 15.2  Hematocrit 39.0 - 52.0 % 46.1 43.4 46.4  Platelets 150 - 400 K/uL 202 211 263   CMP Latest Ref Rng & Units 07/23/2020 06/25/2020 03/26/2020  Glucose 70 - 99 mg/dL 156(H) 83 98  BUN 6 - 20 mg/dL _0 Creatinine 0.61 - 1.24 mg/dL 1.07 1.06 1.09  Sodium 135 - 145 mmol/L 140 144 145(H)  Potassium 3.5 - 5.1 mmol/L 4.0 4.0 3.8  Chloride 98 - 111 mmol/L 107 105 99  CO2 22 - 32 mmol/L _1 Calcium 8.9 - 10.3 mg/dL 9.3 9.6 9.5  Total Protein 6.5 - 8.1 g/dL 6.9 7.1 7.1  Total Bilirubin 0.3 - 1.2 mg/dL 0.4 0.3 0.4  Alkaline Phos 38 - 126 U/L 62 75 148(H)  AST 15 - 41 U/L _2 ALT 0 - 44 U/L _3 Component Value Date/Devin   RBC 5.32 07/23/2020 0948   MCV 86.7 07/23/2020 0948   MCV 85 06/25/2020 1556   MCH 28.0 07/23/2020 0948   MCHC 32.3 07/23/2020 0948   RDW 14.6 07/23/2020 0948   RDW 14.5 06/25/2020 1556   LYMPHSABS 1.4 07/23/2020 0948   LYMPHSABS  1.9 06/25/2020 1556   MONOABS 0.5 07/23/2020 0948   EOSABS 0.1 07/23/2020 0948   EOSABS 0.1 06/25/2020 1556   BASOSABS 0.0 07/23/2020 0948   BASOSABS 0.0 06/25/2020 1556   Lab Results  Component Value Date   LDH 146 07/23/2020   LDH 167 03/23/2020   LDH 185 12/15/2019   Lab Results  Component Value Date   VD25OH 41.5 03/26/2020   VD25OH 27.5 (L) 07/13/2017   VD25OH 26.4 (L) 09/28/2014   PSA 0.4 03/26/2020  PSA 0.5 07/13/2017  PSA 0.5 11/11/2016    DIAGNOSTIC IMAGING:  I have independently reviewed the scans and discussed with the patient. No results found.    ASSESSMENT:  1.  NLP Hodgkin's lymphoma, recurrent 3B: -6 cycles of R-CHOP from 05/24/2018 through 09/14/2018. -XRT to the right axillary lymph node completed around November 2020. -PET scan on 03/21/2019 showed right axillary and subpectoral lymph nodes with the largest right axillary lymph node measuring 1.7 cm, [place] 2. -CT CAP on 09/19/2019 showed patchy bilateral groundglass opacities.  Mildly enlarged right axillary nodes largest measuring 13 x 26 mm unchanged since September 2020.  Subpectoral lymph nodes in the right chest and high right axillary lymph nodes without change.  No adenopathy in the abdomen or pelvis.   2.  Diabetes: -He is taking glipizide XR 5 mg daily. -We will check hemoglobin A1c next visit.   3.  Post splenectomy state: -Received Menactra and Pneumovax on 05/25/2018.   PLAN:  1.  NLP Hodgkin's lymphoma, recurrent 3B: -No B symptoms.  No palpable adenopathy. -Reviewed CT CAP from 03/26/2020 which showed stable right axillary adenopathy measuring 2.4 x 1.6 cm.  No new sites to suggest lymphoma.  Vague area of right hepatic lobe hyperenhancement, similar to prior scan, likely from perfusion anomaly. -Reviewed labs from 03/26/2020 which showed white count at 3.1, hemoglobin 14.9 platelet count normal. -Alkaline phosphatase normalized. -Recommend follow-up in 4 months with labs.   2.   Diabetes: -Continue Metformin under the direction of Dr. Livia Snellen.  Orders placed this encounter:  No orders of the defined types were placed in this encounter.  Ledell Peoples, MD Department of Hematology/Oncology [place] [facility] Phone: [phone] Pager: [phone] Email: [email or web address]

## 2020-08-01 ENCOUNTER — Encounter (HOSPITAL_COMMUNITY): Payer: Self-pay | Admitting: Hematology

## 2020-08-03 ENCOUNTER — Other Ambulatory Visit (HOSPITAL_COMMUNITY): Payer: Self-pay

## 2020-08-03 DIAGNOSIS — C8108 Nodular lymphocyte predominant Hodgkin lymphoma, lymph nodes of multiple sites: Secondary | ICD-10-CM

## 2020-08-03 DIAGNOSIS — Z8571 Personal history of Hodgkin lymphoma: Secondary | ICD-10-CM

## 2020-08-08 ENCOUNTER — Other Ambulatory Visit: Payer: Self-pay

## 2020-08-08 ENCOUNTER — Ambulatory Visit (AMBULATORY_SURGERY_CENTER): Payer: 59

## 2020-08-08 VITALS — Ht 73.0 in | Wt 230.0 lb

## 2020-08-08 DIAGNOSIS — Z1211 Encounter for screening for malignant neoplasm of colon: Secondary | ICD-10-CM

## 2020-08-08 NOTE — Progress Notes (Signed)
TELEVISIT  No egg or soy allergy known to patient  No issues with past sedation with any surgeries or procedures Patient denies ever being told they had issues or difficulty with intubation  No FH of Malignant Hyperthermia No diet pills per patient No home 02 use per patient  No blood thinners per patient  Pt denies issues with constipation  No A fib or A flutter  EMMI video to pt or via MyChart  Pt is fully vaccinated  for Covid   Due to the COVID-19 pandemic we are asking patients to follow certain guidelines.  Pt aware of COVID protocols and [facility] guidelines

## 2020-08-15 ENCOUNTER — Encounter (HOSPITAL_COMMUNITY): Payer: Self-pay | Admitting: Hematology

## 2020-08-16 ENCOUNTER — Encounter: Payer: Self-pay | Admitting: Gastroenterology

## 2020-08-21 ENCOUNTER — Ambulatory Visit (AMBULATORY_SURGERY_CENTER): Payer: 59 | Admitting: Gastroenterology

## 2020-08-21 ENCOUNTER — Other Ambulatory Visit: Payer: Self-pay

## 2020-08-21 ENCOUNTER — Encounter: Payer: Self-pay | Admitting: Gastroenterology

## 2020-08-21 VITALS — BP 97/72 | HR 69 | Temp 98.4°F | Resp 22 | Ht 73.0 in | Wt 230.0 lb

## 2020-08-21 DIAGNOSIS — Z1211 Encounter for screening for malignant neoplasm of colon: Secondary | ICD-10-CM

## 2020-08-21 DIAGNOSIS — D122 Benign neoplasm of ascending colon: Secondary | ICD-10-CM

## 2020-08-21 DIAGNOSIS — D123 Benign neoplasm of transverse colon: Secondary | ICD-10-CM

## 2020-08-21 DIAGNOSIS — D120 Benign neoplasm of cecum: Secondary | ICD-10-CM

## 2020-08-21 MED ORDER — SODIUM CHLORIDE 0.9 % IV SOLN: 500.0000 mL | Freq: Once | INTRAVENOUS | Status: DC

## 2020-08-21 NOTE — Progress Notes (Signed)
Called to room to assist during endoscopic procedure.  Patient ID and intended procedure confirmed with present staff. Received instructions for my participation in the procedure from the performing physician.  

## 2020-08-21 NOTE — Op Note (Signed)
[facility] Patient Name: Devin Tran Procedure Date: 08/21/2020 9:12 AM MRN: [id number] Endoscopist: Justice Britain , MD Age: 55 Referring MD:  Date of Birth: 11-22-1965 Gender: Male Account #: [id number] Procedure:                Colonoscopy Indications:              Screening for colorectal malignant neoplasm, This                            is the patient's first colonoscopy Medicines:                Monitored Anesthesia Care Procedure:                Pre-Anesthesia Assessment:                           - Prior to the procedure, a History and Physical                            was performed, and patient medications and                            allergies were reviewed. The patient's tolerance of                            previous anesthesia was also reviewed. The risks                            and benefits of the procedure and the sedation                            options and risks were discussed with the patient.                            All questions were answered, and informed consent                            was obtained. Prior Anticoagulants: The patient has                            taken no previous anticoagulant or antiplatelet                            agents. ASA Grade Assessment: II - A patient with                            mild systemic disease. After reviewing the risks                            and benefits, the patient was deemed in                            satisfactory condition to undergo the procedure.  After obtaining informed consent, the colonoscope                            was passed under direct vision. Throughout the                            procedure, the patient's blood pressure, pulse, and                            oxygen saturations were monitored continuously. The                            CF [id number] #[id number] was introduced through the anus                            and advanced to the the  cecum, identified by                            appendiceal orifice and ileocecal valve. The                            colonoscopy was performed without difficulty. The                            patient tolerated the procedure. The quality of the                            bowel preparation was adequate. The ileocecal                            valve, appendiceal orifice, and rectum were                            photographed. Scope In: 9:28:10 AM Scope Out: 9:45:22 AM Scope Withdrawal Time: 0 hours 13 minutes 18 seconds  Total Procedure Duration: 0 hours 17 minutes 12 seconds  Findings:                 The digital rectal exam findings include                            hemorrhoids. Pertinent negatives include no                            palpable rectal lesions.                           Eight sessile polyps were found in the transverse                            colon (2), ascending colon (5) and cecum (1). The                            polyps were 2 to 10 mm in size. These polyps were  removed with a cold snare. Resection and retrieval                            were complete.                           A few small-mouthed diverticula were found in the                            ascending colon.                           Normal mucosa was found in the entire colon                            otherwise.                           Non-bleeding non-thrombosed internal hemorrhoids                            were found during retroflexion, during perianal                            exam and during digital exam. The hemorrhoids were                            Grade II (internal hemorrhoids that prolapse but                            reduce spontaneously). Complications:            No immediate complications. Estimated Blood Loss:     Estimated blood loss was minimal. Impression:               - Hemorrhoids found on digital rectal exam.                            - Eight, 2 to 10 mm polyps in the transverse colon,                            in the ascending colon and in the cecum, removed                            with a cold snare. Resected and retrieved.                           - Diverticulosis in the ascending colon.                           - Normal mucosa in the entire examined colon                            otherwise.                           -  Non-bleeding non-thrombosed internal hemorrhoids. Recommendation:           - The patient will be observed post-procedure,                            until all discharge criteria are met.                           - Discharge patient to home.                           - Patient has a contact number available for                            emergencies. The signs and symptoms of potential                            delayed complications were discussed with the                            patient. Return to normal activities tomorrow.                            Written discharge instructions were provided to the                            patient.                           - High fiber diet.                           - Use FiberCon 1-2 tablets PO daily.                           - Continue present medications.                           - Await pathology results.                           - Repeat colonoscopy in 3 years for surveillance.                           - The findings and recommendations were discussed                            with the patient.                           - The findings and recommendations were discussed                            with the patient's family. Justice Britain, MD 08/21/2020 9:52:21 AM

## 2020-08-21 NOTE — Patient Instructions (Signed)
Information on polyps, diverticulosis and hemorrhoids given to you today.  Await pathology results.  Resume previous diet and medications.  Eat a high fiber diet and use Fiber Con 1-2 tablets by mouth each day.  YOU HAD AN ENDOSCOPIC PROCEDURE TODAY AT [facility]:   Refer to the procedure report that was given to you for any specific questions about what was found during the examination.  If the procedure report does not answer your questions, please call your gastroenterologist to clarify.  If you requested that your care partner not be given the details of your procedure findings, then the procedure report has been included in a sealed envelope for you to review at your convenience later.  YOU SHOULD EXPECT: Some feelings of bloating in the abdomen. Passage of more gas than usual.  Walking can help get rid of the air that was put into your GI tract during the procedure and reduce the bloating. If you had a lower endoscopy (such as a colonoscopy or flexible sigmoidoscopy) you may notice spotting of blood in your stool or on the toilet paper. If you underwent a bowel prep for your procedure, you may not have a normal bowel movement for a few days.  Please Note:  You might notice some irritation and congestion in your nose or some drainage.  This is from the oxygen used during your procedure.  There is no need for concern and it should clear up in a day or so.  SYMPTOMS TO REPORT IMMEDIATELY:  Following lower endoscopy (colonoscopy or flexible sigmoidoscopy):  Excessive amounts of blood in the stool  Significant tenderness or worsening of abdominal pains  Swelling of the abdomen that is new, acute  Fever of 100F or higher   For urgent or emergent issues, a gastroenterologist can be reached at any hour by calling [phone]. Do not use MyChart messaging for urgent concerns.    DIET:  We do recommend a small meal at first, but then you may proceed to your regular diet.   Drink plenty of fluids but you should avoid alcoholic beverages for 24 hours.  ACTIVITY:  You should plan to take it easy for the rest of today and you should NOT DRIVE or use heavy machinery until tomorrow (because of the sedation medicines used during the test).    FOLLOW UP: Our staff will call the number listed on your records 48-72 hours following your procedure to check on you and address any questions or concerns that you may have regarding the information given to you following your procedure. If we do not reach you, we will leave a message.  We will attempt to reach you two times.  During this call, we will ask if you have developed any symptoms of COVID 19. If you develop any symptoms (ie: fever, flu-like symptoms, shortness of breath, cough etc.) before then, please call [phone].  If you test positive for Covid 19 in the 2 weeks post procedure, please call and report this information to [place].    If any biopsies were taken you will be contacted by phone or by letter within the next 1-3 weeks.  Please call us at [phone] if you have not heard about the biopsies in 3 weeks.    SIGNATURES/CONFIDENTIALITY: You and/or your care partner have signed paperwork which will be entered into your electronic medical record.  These signatures attest to the fact that that the information above on your After Visit Summary has been reviewed and  is understood.  Full responsibility of the confidentiality of this discharge information lies with you and/or your care-partner.

## 2020-08-21 NOTE — Progress Notes (Signed)
Pt's states no medical or surgical changes since previsit or office visit. 

## 2020-08-21 NOTE — Progress Notes (Signed)
C.W. vital signs. 

## 2020-08-21 NOTE — Progress Notes (Signed)
Report given to PACU, vss 

## 2020-08-21 NOTE — Progress Notes (Signed)
Patient experiencing nausea and vomiting.  MD updated and Zofran 4 mg IV given, vss

## 2020-08-26 ENCOUNTER — Encounter: Payer: Self-pay | Admitting: Gastroenterology

## 2020-09-02 ENCOUNTER — Other Ambulatory Visit: Payer: Self-pay | Admitting: Family Medicine

## 2020-09-19 ENCOUNTER — Inpatient Hospital Stay (HOSPITAL_COMMUNITY): Payer: 59 | Attending: Hematology

## 2020-09-19 ENCOUNTER — Other Ambulatory Visit: Payer: Self-pay

## 2020-09-19 DIAGNOSIS — Z452 Encounter for adjustment and management of vascular access device: Secondary | ICD-10-CM | POA: Diagnosis not present

## 2020-09-19 DIAGNOSIS — Z8572 Personal history of non-Hodgkin lymphomas: Secondary | ICD-10-CM | POA: Insufficient documentation

## 2020-09-19 MED ORDER — SODIUM CHLORIDE 0.9% FLUSH
10.0000 mL | Freq: Once | INTRAVENOUS | Status: AC
  Administered 2020-09-19: 10 mL via INTRAVENOUS

## 2020-09-19 MED ORDER — HEPARIN SOD (PORK) LOCK FLUSH 100 UNIT/ML IV SOLN
500.0000 [IU] | Freq: Once | INTRAVENOUS | Status: AC
  Administered 2020-09-19: 500 [IU] via INTRAVENOUS

## 2020-09-19 NOTE — Patient Instructions (Signed)
[facility]  Discharge Instructions: Thank you for choosing [facility] to provide your oncology and hematology care.  If you have a lab appointment with the Cancer Center, please come in thru the Main Entrance and check in at the main information desk.     We strive to give you quality time with your provider. You may need to reschedule your appointment if you arrive late (15 or more minutes).  Arriving late affects you and other patients whose appointments are after yours.  Also, if you miss three or more appointments without notifying the office, you may be dismissed from the clinic at the provider's discretion.      For prescription refill requests, have your pharmacy contact our office and allow 72 hours for refills to be completed.     Should you have questions after your visit or need to cancel or reschedule your appointment, please contact [facility] [phone]  and follow the prompts.  Office hours are 8:00 a.m. to 4:30 p.m. Monday - Friday. Please note that voicemails left after 4:00 p.m. may not be returned until the following business day.  We are closed weekends and major holidays. You have access to a nurse at all times for urgent questions. Please call the main number to the clinic [phone] and follow the prompts.  For any non-urgent questions, you may also contact your provider using MyChart. We now offer e-Visits for anyone 18 and older to request care online for non-urgent symptoms. For details visit [email or web address].   Also download the MyChart app! Go to the app store, search "MyChart", open the app, select [facility], and log in with your MyChart username and password.  Due to Covid, a mask is required upon entering the hospital/clinic. If you do not have a mask, one will be given to you upon arrival. For doctor visits, patients may have 1 support person aged 18 or older with them. For treatment visits, patients cannot have  anyone with them due to current Covid guidelines and our immunocompromised population.  

## 2020-09-19 NOTE — Progress Notes (Signed)
Devin Tran presented for Portacath access and flush. Portacath located chest wall accessed with  H 20 needle. No blood return and no resistance met while flushing Portacath flushed with 89m NS and 500U/565mHeparin and needle removed intact. Procedure without incident. Patient tolerated procedure well.  Vitals stable and discharged home from clinic ambulatory. Follow up as scheduled.

## 2020-09-25 ENCOUNTER — Other Ambulatory Visit: Payer: Self-pay

## 2020-09-25 ENCOUNTER — Ambulatory Visit (INDEPENDENT_AMBULATORY_CARE_PROVIDER_SITE_OTHER): Payer: 59 | Admitting: Family Medicine

## 2020-09-25 ENCOUNTER — Encounter: Payer: Self-pay | Admitting: Family Medicine

## 2020-09-25 VITALS — BP 135/90 | HR 85 | Temp 97.6°F | Ht 73.0 in | Wt 228.6 lb

## 2020-09-25 DIAGNOSIS — E119 Type 2 diabetes mellitus without complications: Secondary | ICD-10-CM

## 2020-09-25 DIAGNOSIS — I10 Essential (primary) hypertension: Secondary | ICD-10-CM

## 2020-09-25 DIAGNOSIS — Z23 Encounter for immunization: Secondary | ICD-10-CM | POA: Diagnosis not present

## 2020-09-25 DIAGNOSIS — E782 Mixed hyperlipidemia: Secondary | ICD-10-CM

## 2020-09-25 LAB — BAYER DCA HB A1C WAIVED: HB A1C (BAYER DCA - WAIVED): 6.8 % (ref <7.0)

## 2020-09-25 MED ORDER — ROSUVASTATIN CALCIUM 10 MG PO TABS: ORAL_TABLET | ORAL | 3 refills | Status: DC

## 2020-09-25 MED ORDER — AMLODIPINE BESYLATE 10 MG PO TABS: 10.0000 mg | ORAL_TABLET | Freq: Every day | ORAL | 3 refills | Status: DC

## 2020-09-25 MED ORDER — METFORMIN HCL ER 750 MG PO TB24: ORAL_TABLET | ORAL | 3 refills | Status: DC

## 2020-09-25 NOTE — Progress Notes (Signed)
Subjective:  Patient ID: Devin Tran,  male    DOB: [birth year]  Age: 55 y.o.    CC: Medical Management of Chronic Issues   HPI Devin Tran presents for  follow-up of hypertension. Patient has no history of headache chest pain or shortness of breath or recent cough. Patient also denies symptoms of TIA such as numbness weakness lateralizing. Patient denies side effects from medication. States taking it regularly.  Patient also  in for follow-up of elevated cholesterol. Doing well without complaints on current medication. Denies side effects  including myalgia and arthralgia and nausea. Also in today for liver function testing. Currently no chest pain, shortness of breath or other cardiovascular related symptoms noted.  Follow-up of diabetes. Patient does check blood sugar at home. Readings run between 100 and 160 Patient denies symptoms such as excessive hunger or urinary frequency, excessive hunger, nausea No significant hypoglycemic spells noted. Medications reviewed. Pt reports taking them regularly. Pt. denies complication/adverse reaction today.  Working a lot of overtime. Not getting enough rest.  History Devin Tran has a past medical history of Complication of anesthesia, Erectile dysfunction, Hodgkin's lymphoma ([place]), Hyperlipidemia, Hypertension, Infertility, Low testosterone, and PONV (postoperative nausea and vomiting).   Devin Tran has a past surgical history that includes Splenectomy (Left, Over 20 years ago); PORTA CATH INSERTION; PORTA CATH REMOVAL; Axillary lymph node biopsy (Right, 05/10/2018); Portacath placement (Left, 05/21/2018); and IR LYMPHANGIOGRAM EXTREMITY UNI LEFT (03/02/1987).   His family history includes COPD in his father; Cancer in his father and paternal uncle; Diabetes in his father; Hypertension in his brother, father, and mother; Stroke in his brother.Devin Tran reports that Devin Tran has never smoked. Devin Tran has never used smokeless tobacco. Devin Tran reports that Devin Tran does not drink alcohol and  does not use drugs.  Current Outpatient Medications on File Prior to Visit  Medication Sig Dispense Refill   Accu-Chek Softclix Lancets lancets SMARTSIG:2 Topical Twice Daily PRN     amLODipine (NORVASC) 10 MG tablet TAKE 1 TABLET BY MOUTH EVERY DAY 90 tablet 0   Ascorbic Acid (VITAMIN C) 1000 MG tablet Take 1,000 mg by mouth daily. Vitamin c lozenge     aspirin EC 81 MG tablet Take 81 mg by mouth daily.      blood glucose meter kit and supplies KIT Dispense based on patient and insurance preference. Use up to four times daily as directed. (FOR ICD-10 : E11.9 1 each 0   glucose blood (ACCU-CHEK GUIDE) test strip CHECK SUGAR UP TO 4 TIMES DAILY Dx E11.9 400 strip 3   lidocaine-prilocaine (EMLA) cream Apply pea-sized amount to port a cath site and cover with plastic wrap one hour prior to each chemotherapy appointment 30 g 3   metFORMIN (GLUCOPHAGE-XR) 750 MG 24 hr tablet TAKE 1 TABLET BY MOUTH EVERY DAY WITH BREAKFAST 90 tablet 0   Multiple Vitamins-Minerals (ONE-A-DAY MENS HEALTH FORMULA PO) Take 1 tablet by mouth daily.      rosuvastatin (CRESTOR) 10 MG tablet TAKE 1 TABLET BY MOUTH DAILY. FOR CHOLESTEROL 90 tablet 0   VITAMIN D PO Take by mouth once.     No current facility-administered medications on file prior to visit.    ROS Review of Systems  Constitutional:  Negative for fever.  Respiratory:  Negative for shortness of breath.   Cardiovascular:  Negative for chest pain.  Musculoskeletal:  Negative for arthralgias.  Skin:  Negative for rash.   Objective:  BP 135/90   Pulse 85   Temp 97.6 F (36.4  C)   Ht '6\' 1"'  (1.854 m)   Wt 228 lb 9.6 oz (103.7 kg)   SpO2 98%   BMI 30.16 kg/m   BP Readings from Last 3 Encounters:  09/25/20 135/90  09/19/20 120/86  08/21/20 97/72    Wt Readings from Last 3 Encounters:  09/25/20 228 lb 9.6 oz (103.7 kg)  08/21/20 230 lb (104.3 kg)  08/08/20 230 lb (104.3 kg)     Physical Exam Vitals reviewed.  Constitutional:       Appearance: Devin Tran is well-developed.  HENT:     Head: Normocephalic and atraumatic.     Right Ear: External ear normal.     Left Ear: External ear normal.     Mouth/Throat:     Pharynx: No oropharyngeal exudate or posterior oropharyngeal erythema.  Eyes:     Pupils: Pupils are equal, round, and reactive to light.  Cardiovascular:     Rate and Rhythm: Normal rate and regular rhythm.     Heart sounds: No murmur heard. Pulmonary:     Effort: No respiratory distress.     Breath sounds: Normal breath sounds.  Musculoskeletal:     Cervical back: Normal range of motion and neck supple.  Neurological:     Mental Status: Devin Tran is alert and oriented to person, place, and time.    Diabetic Foot Exam - Simple   No data filed       Assessment & Plan:   Giomar was seen today for medical management of chronic issues.  Diagnoses and all orders for this visit:  Type 2 diabetes mellitus without complication, without long-term current use of insulin (HCC) -     Bayer DCA Hb A1c Waived -     CBC with Differential/Platelet  Mixed hyperlipidemia -     Lipid panel  Essential hypertension -     CMP14+EGFR   I am having Devin Tran maintain his aspirin EC, Multiple Vitamins-Minerals (ONE-A-DAY MENS HEALTH FORMULA PO), lidocaine-prilocaine, vitamin C, VITAMIN D PO, blood glucose meter kit and supplies, Accu-Chek Softclix Lancets, Accu-Chek Guide, metFORMIN, amLODipine, and rosuvastatin.  No orders of the defined types were placed in this encounter.    Follow-up: No follow-ups on file.  [name], M.D.

## 2020-09-26 ENCOUNTER — Encounter (HOSPITAL_COMMUNITY): Payer: Self-pay | Admitting: Hematology

## 2020-09-26 LAB — CBC WITH DIFFERENTIAL/PLATELET
Basophils Absolute: 0 10*3/uL (ref 0.0–0.2)
Basos: 1 %
EOS (ABSOLUTE): 0.1 10*3/uL (ref 0.0–0.4)
Eos: 2 %
Hematocrit: 44.9 % (ref 37.5–51.0)
Hemoglobin: 15 g/dL (ref 13.0–17.7)
Immature Grans (Abs): 0 10*3/uL (ref 0.0–0.1)
Immature Granulocytes: 0 %
Lymphocytes Absolute: 1.3 10*3/uL (ref 0.7–3.1)
Lymphs: 39 %
MCH: 27.5 pg (ref 26.6–33.0)
MCHC: 33.4 g/dL (ref 31.5–35.7)
MCV: 82 fL (ref 79–97)
Monocytes Absolute: 0.5 10*3/uL (ref 0.1–0.9)
Monocytes: 14 %
Neutrophils Absolute: 1.5 10*3/uL (ref 1.4–7.0)
Neutrophils: 44 %
Platelets: 240 10*3/uL (ref 150–450)
RBC: 5.45 x10E6/uL (ref 4.14–5.80)
RDW: 13.9 % (ref 11.6–15.4)
WBC: 3.4 10*3/uL (ref 3.4–10.8)

## 2020-09-26 LAB — LIPID PANEL
Chol/HDL Ratio: 3.1 ratio (ref 0.0–5.0)
Cholesterol, Total: 117 mg/dL (ref 100–199)
HDL: 38 mg/dL — ABNORMAL LOW (ref >39)
LDL Chol Calc (NIH): 64 mg/dL (ref 0–99)
Triglycerides: 72 mg/dL (ref 0–149)
VLDL Cholesterol Cal: 15 mg/dL (ref 5–40)

## 2020-09-26 LAB — CMP14+EGFR
ALT: 23 IU/L (ref 0–44)
AST: 17 IU/L (ref 0–40)
Albumin/Globulin Ratio: 2.8 — ABNORMAL HIGH (ref 1.2–2.2)
Albumin: 4.8 g/dL (ref 3.8–4.9)
Alkaline Phosphatase: 76 IU/L (ref 44–121)
BUN/Creatinine Ratio: 14 (ref 9–20)
BUN: 16 mg/dL (ref 6–24)
Bilirubin Total: 0.6 mg/dL (ref 0.0–1.2)
CO2: 20 mmol/L (ref 20–29)
Calcium: 9.5 mg/dL (ref 8.7–10.2)
Chloride: 104 mmol/L (ref 96–106)
Creatinine, Ser: 1.13 mg/dL (ref 0.76–1.27)
Globulin, Total: 1.7 g/dL (ref 1.5–4.5)
Glucose: 235 mg/dL — ABNORMAL HIGH (ref 65–99)
Potassium: 4.3 mmol/L (ref 3.5–5.2)
Sodium: 142 mmol/L (ref 134–144)
Total Protein: 6.5 g/dL (ref 6.0–8.5)
eGFR: 78 mL/min/{1.73_m2} (ref >59)

## 2020-11-22 ENCOUNTER — Inpatient Hospital Stay (HOSPITAL_COMMUNITY): Payer: 59 | Attending: Hematology

## 2020-11-22 ENCOUNTER — Other Ambulatory Visit: Payer: Self-pay

## 2020-11-22 ENCOUNTER — Other Ambulatory Visit (HOSPITAL_COMMUNITY): Payer: 59

## 2020-11-22 DIAGNOSIS — Z8571 Personal history of Hodgkin lymphoma: Secondary | ICD-10-CM

## 2020-11-22 DIAGNOSIS — Z23 Encounter for immunization: Secondary | ICD-10-CM | POA: Diagnosis present

## 2020-11-22 DIAGNOSIS — C8108 Nodular lymphocyte predominant Hodgkin lymphoma, lymph nodes of multiple sites: Secondary | ICD-10-CM

## 2020-11-22 DIAGNOSIS — C8100 Nodular lymphocyte predominant Hodgkin lymphoma, unspecified site: Secondary | ICD-10-CM | POA: Insufficient documentation

## 2020-11-22 LAB — CBC WITH DIFFERENTIAL/PLATELET
Abs Immature Granulocytes: 0 10*3/uL (ref 0.00–0.07)
Basophils Absolute: 0 10*3/uL (ref 0.0–0.1)
Basophils Relative: 1 %
Eosinophils Absolute: 0 10*3/uL (ref 0.0–0.5)
Eosinophils Relative: 1 %
HCT: 45 % (ref 39.0–52.0)
Hemoglobin: 14.7 g/dL (ref 13.0–17.0)
Immature Granulocytes: 0 %
Lymphocytes Relative: 39 %
Lymphs Abs: 1.1 10*3/uL (ref 0.7–4.0)
MCH: 28.1 pg (ref 26.0–34.0)
MCHC: 32.7 g/dL (ref 30.0–36.0)
MCV: 85.9 fL (ref 80.0–100.0)
Monocytes Absolute: 0.4 10*3/uL (ref 0.1–1.0)
Monocytes Relative: 14 %
Neutro Abs: 1.3 10*3/uL — ABNORMAL LOW (ref 1.7–7.7)
Neutrophils Relative %: 45 %
Platelets: 233 10*3/uL (ref 150–400)
RBC: 5.24 MIL/uL (ref 4.22–5.81)
RDW: 15 % (ref 11.5–15.5)
WBC: 2.9 10*3/uL — ABNORMAL LOW (ref 4.0–10.5)
nRBC: 0 % (ref 0.0–0.2)

## 2020-11-22 LAB — COMPREHENSIVE METABOLIC PANEL
ALT: 26 U/L (ref 0–44)
AST: 21 U/L (ref 15–41)
Albumin: 4.5 g/dL (ref 3.5–5.0)
Alkaline Phosphatase: 69 U/L (ref 38–126)
Anion gap: 7 (ref 5–15)
BUN: 18 mg/dL (ref 6–20)
CO2: 24 mmol/L (ref 22–32)
Calcium: 9.1 mg/dL (ref 8.9–10.3)
Chloride: 107 mmol/L (ref 98–111)
Creatinine, Ser: 1.09 mg/dL (ref 0.61–1.24)
GFR, Estimated: >60 mL/min (ref >60)
Glucose, Bld: 177 mg/dL — ABNORMAL HIGH (ref 70–99)
Potassium: 3.9 mmol/L (ref 3.5–5.1)
Sodium: 138 mmol/L (ref 135–145)
Total Bilirubin: 0.7 mg/dL (ref 0.3–1.2)
Total Protein: 7 g/dL (ref 6.5–8.1)

## 2020-11-22 LAB — LACTATE DEHYDROGENASE: LDH: 155 U/L (ref 98–192)

## 2020-11-22 LAB — SEDIMENTATION RATE: Sed Rate: 5 mm/hr (ref 0–16)

## 2020-11-28 NOTE — Progress Notes (Signed)
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 R-CHOP x 6 cycles from 05/24/2018 to 09/14/2018. 2. XRT to right axillary lymph nodes around 12/2018.  CURRENT THERAPY: surveillance  BRIEF ONCOLOGIC HISTORY:  Oncology History  Nodular lymphocyte predom Hodgkin lymphoma lymph nodes multiple sites ([place])  05/25/2018 Initial Diagnosis   Nodular lymphocyte predom Hodgkin lymphoma lymph nodes multiple sites ([place])   06/01/2018 -  Chemotherapy   The patient had DOXOrubicin (ADRIAMYCIN) chemo injection 118 mg, 50 mg/m2 = 118 mg, Intravenous,  Once, 6 of 6 cycles Administration: 118 mg (06/01/2018), 118 mg (06/22/2018), 118 mg (07/13/2018), 118 mg (08/03/2018), 118 mg (08/24/2018), 118 mg (09/14/2018) palonosetron (ALOXI) injection 0.25 mg, 0.25 mg, Intravenous,  Once, 6 of 6 cycles Administration: 0.25 mg (06/01/2018), 0.25 mg (06/22/2018), 0.25 mg (07/13/2018), 0.25 mg (08/03/2018), 0.25 mg (08/24/2018), 0.25 mg (09/14/2018) pegfilgrastim (NEULASTA ONPRO KIT) injection 6 mg, 6 mg, Subcutaneous, Once, 6 of 6 cycles Administration: 6 mg (06/01/2018), 6 mg (06/22/2018), 6 mg (07/13/2018), 6 mg (08/03/2018), 6 mg (08/24/2018), 6 mg (09/14/2018) vinCRIStine (ONCOVIN) 2 mg in sodium chloride 0.9 % 50 mL chemo infusion, 2 mg, Intravenous,  Once, 6 of 6 cycles Administration: 2 mg (06/01/2018), 2 mg (06/22/2018), 2 mg (07/13/2018), 2 mg (08/03/2018), 2 mg (08/24/2018), 2 mg (09/14/2018) riTUXimab (RITUXAN) 900 mg in sodium chloride 0.9 % 250 mL (2.6471 mg/mL) infusion, 375 mg/m2 = 900 mg, Intravenous,  Once, 1 of 1 cycle Administration: 900 mg (06/01/2018) cyclophosphamide (CYTOXAN) 1,760 mg in sodium chloride 0.9 % 250 mL chemo infusion, 750 mg/m2 = 1,760 mg, Intravenous,  Once, 6  of 6 cycles Administration: 1,760 mg (06/01/2018), 1,760 mg (06/22/2018), 1,760 mg (07/13/2018), 1,760 mg (08/03/2018), 1,760 mg (08/24/2018), 1,760 mg (09/14/2018) riTUXimab (RITUXAN) 900 mg in sodium chloride 0.9 % 160 mL infusion, 375 mg/m2 = 900 mg (100 % of original dose 375 mg/m2), Intravenous,  Once, 5 of 5 cycles Dose modification: 375 mg/m2 (original dose 375 mg/m2, Cycle 2) Administration: 900 mg (06/22/2018), 900 mg (07/13/2018), 900 mg (08/03/2018), 900 mg (08/24/2018), 900 mg (09/14/2018) dexrazoxane (ZINECARD) 1,180 mg in lactated ringers 300 mL IVPB, 500 mg/m2 = 1,180 mg (100 % of original dose 500 mg/m2), Intravenous,  Once, 3 of 3 cycles Dose modification: 500 mg/m2 (original dose 500 mg/m2, Cycle 4), 500 mg/m2 (original dose 500 mg/m2, Cycle 4) Administration: 1,180 mg (08/24/2018), 1,180 mg (08/03/2018), 1,180 mg (09/14/2018)   for chemotherapy treatment.       CANCER STAGING: Cancer Staging No matching staging information was found for the patient.  INTERVAL HISTORY:  Devin Tran, a 55 y.o. 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 Devin Tran was last seen on 04/02/2020.   Today he reports feeling good. He denies fevers, night sweats, and weight loss.   REVIEW OF SYSTEMS:  Review of Systems  Constitutional:  Negative for appetite change, fatigue, fever and unexpected weight change.  Neurological:  Positive for numbness.  All other systems reviewed and are negative.  PAST MEDICAL/SURGICAL HISTORY:  Past Medical History:  Diagnosis Date   Complication of anesthesia    PONV per anesth notes   Erectile dysfunction    Hodgkin's lymphoma ([place])    Hyperlipidemia    Hypertension  Infertility    Low testosterone    PONV (postoperative nausea and vomiting)    Past Surgical History:  Procedure Laterality Date   AXILLARY LYMPH NODE BIOPSY Right 05/10/2018   Procedure: AXILLARY LYMPH NODE BIOPSY, RIGHT;  Surgeon:  Aviva Signs, MD;  Location: [facility];  Service: General;  Laterality: Right;   IR LYMPHANGIOGRAM EXTREMITY UNI LEFT  03/02/1987   PORTA CATH INSERTION     PORTA CATH REMOVAL     PORTACATH PLACEMENT Left 05/21/2018   Procedure: INSERTION PORT-A-CATH (attached catheter in left subclavian);  Surgeon: Aviva Signs, MD;  Location: [facility];  Service: General;  Laterality: Left;   SPLENECTOMY Left Over 20 years ago   Due to Hodgkin's lymphoma    SOCIAL HISTORY:  Social History   Socioeconomic History   Marital status: Single    Spouse name: Not on file   Number of children: Not on file   Years of education: Not on file   Highest education level: Not on file  Occupational History   Occupation: [employer]    Employer: [employer]  Tobacco Use   Smoking status: Never   Smokeless tobacco: Never  Vaping Use   Vaping Use: Not on file  Substance and Sexual Activity   Alcohol use: No   Drug use: No   Sexual activity: Yes  Other Topics Concern   Not on file  Social History Narrative   Not on file   Social Determinants of Health   Financial Resource Strain: Not on file  Food Insecurity: Not on file  Transportation Needs: No Transportation Needs   Lack of Transportation (Medical): No   Lack of Transportation (Non-Medical): No  Physical Activity: Inactive   Days of Exercise per Week: 0 days   Minutes of Exercise per Session: 0 min  Stress: Not on file  Social Connections: Not on file  Intimate Partner Violence: Not At Risk   Fear of Current or Ex-Partner: No   Emotionally Abused: No   Physically Abused: No   Sexually Abused: No    FAMILY HISTORY:  Family History  Problem Relation Age of Onset   Hypertension Mother    Cancer Father        lung   Hypertension Father    Diabetes Father    COPD Father    Hypertension Brother    Stroke Brother    Cancer Paternal Uncle        possibly throat cancer   Colon cancer Neg Hx    Esophageal cancer Neg Hx    Rectal  cancer Neg Hx    Stomach cancer Neg Hx     CURRENT MEDICATIONS:  Current Outpatient Medications  Medication Sig Dispense Refill   Accu-Chek Softclix Lancets lancets SMARTSIG:2 Topical Twice Daily PRN     amLODipine (NORVASC) 10 MG tablet Take 1 tablet (10 mg total) by mouth daily. 90 tablet 3   Ascorbic Acid (VITAMIN C) 1000 MG tablet Take 1,000 mg by mouth daily. Vitamin c lozenge     aspirin EC 81 MG tablet Take 81 mg by mouth daily.      blood glucose meter kit and supplies KIT Dispense based on patient and insurance preference. Use up to four times daily as directed. (FOR ICD-10 : E11.9 1 each 0   glucose blood (ACCU-CHEK GUIDE) test strip CHECK SUGAR UP TO 4 TIMES DAILY Dx E11.9 400 strip 3   lidocaine-prilocaine (EMLA) cream Apply pea-sized amount to port a cath site and cover  with plastic wrap one hour prior to each chemotherapy appointment 30 g 3   metFORMIN (GLUCOPHAGE-XR) 750 MG 24 hr tablet TAKE 1 TABLET BY MOUTH EVERY DAY WITH BREAKFAST 90 tablet 3   Multiple Vitamins-Minerals (ONE-A-DAY MENS HEALTH FORMULA PO) Take 1 tablet by mouth daily.      rosuvastatin (CRESTOR) 10 MG tablet TAKE 1 TABLET BY MOUTH DAILY. FOR CHOLESTEROL 90 tablet 3   VITAMIN D PO Take by mouth once.     No current facility-administered medications for this visit.    ALLERGIES:  Allergies  Allergen Reactions   Bleomycin Other (See Comments)    Cramping all over    PHYSICAL EXAM:  Performance status (ECOG): 1 - Symptomatic but completely ambulatory  There were no vitals filed for this visit. Wt Readings from Last 3 Encounters:  09/25/20 228 lb 9.6 oz (103.7 kg)  08/21/20 230 lb (104.3 kg)  08/08/20 230 lb (104.3 kg)   Physical Exam Vitals reviewed.  Constitutional:      Appearance: Normal appearance.  Cardiovascular:     Rate and Rhythm: Normal rate and regular rhythm.     Pulses: Normal pulses.     Heart sounds: Normal heart sounds.  Pulmonary:     Effort: Pulmonary effort is normal.      Breath sounds: Normal breath sounds.  Abdominal:     Palpations: Abdomen is soft. There is no hepatomegaly, splenomegaly or mass.     Tenderness: There is no abdominal tenderness.  Lymphadenopathy:     Upper Body:     Right upper body: No supraclavicular, axillary or pectoral adenopathy.     Left upper body: No supraclavicular, axillary or pectoral adenopathy.     Lower Body: No right inguinal adenopathy. No left inguinal adenopathy.  Neurological:     General: No focal deficit present.     Mental Status: He is alert and oriented to person, place, and time.  Psychiatric:        Mood and Affect: Mood normal.        Behavior: Behavior normal.     LABORATORY DATA:  I have reviewed the labs as listed.  CBC Latest Ref Rng & Units 11/22/2020 09/25/2020 07/23/2020  WBC 4.0 - 10.5 K/uL 2.9(L) 3.4 3.1(L)  Hemoglobin 13.0 - 17.0 g/dL 14.7 15.0 14.9  Hematocrit 39.0 - 52.0 % 45.0 44.9 46.1  Platelets 150 - 400 K/uL 233 240 202   CMP Latest Ref Rng & Units 11/22/2020 09/25/2020 07/23/2020  Glucose 70 - 99 mg/dL 177(H) 235(H) 156(H)  BUN 6 - 20 mg/dL '18 16 15  ' Creatinine 0.61 - 1.24 mg/dL 1.09 1.13 1.07  Sodium 135 - 145 mmol/L 138 142 140  Potassium 3.5 - 5.1 mmol/L 3.9 4.3 4.0  Chloride 98 - 111 mmol/L 107 104 107  CO2 22 - 32 mmol/L '24 20 26  ' Calcium 8.9 - 10.3 mg/dL 9.1 9.5 9.3  Total Protein 6.5 - 8.1 g/dL 7.0 6.5 6.9  Total Bilirubin 0.3 - 1.2 mg/dL 0.7 0.6 0.4  Alkaline Phos 38 - 126 U/L 69 76 62  AST 15 - 41 U/L '21 17 20  ' ALT 0 - 44 U/L '26 23 25    ' DIAGNOSTIC IMAGING:  I have independently reviewed the scans and discussed with the patient. No results found.   ASSESSMENT:  1.  NLP Hodgkin's lymphoma, recurrent 3B: -6 cycles of R-CHOP from 05/24/2018 through 09/14/2018. -XRT to the right axillary lymph node completed around November 2020. -PET scan on 03/21/2019 showed  right axillary and subpectoral lymph nodes with the largest right axillary lymph node measuring 1.7 cm,  [place] 2. -CT CAP on 09/19/2019 showed patchy bilateral groundglass opacities.  Mildly enlarged right axillary nodes largest measuring 13 x 26 mm unchanged since September 2020.  Subpectoral lymph nodes in the right chest and high right axillary lymph nodes without change.  No adenopathy in the abdomen or pelvis.   2.  Diabetes: -He is taking glipizide XR 5 mg daily. -We will check hemoglobin A1c next visit.   3.  Post splenectomy state: -Received Menactra and Pneumovax on 05/25/2018.   PLAN:  1.  NLP Hodgkin's lymphoma, recurrent 3B: - He does not have any fevers, night sweats or weight loss. - Physical examination did not reveal any palpable masses or lymph nodes. - Mild numbness and tingling in the hands and feet has been stable. - Reviewed labs from 11/22/2020 showed normal LFTs and LDH.  He has mild leukopenia since he received treatments.  White count 2.9 and ANC 1.3.  No recurrent infections.  Hemoglobin platelets normal.  ESR was normal. - Recommend follow-up in 6 months.  We will plan to repeat CT CAP prior to next visit along with labs. - If he continues to be in remission, will discontinue his port.   2.  Diabetes: - Continue metformin daily.   Orders placed this encounter:  No orders of the defined types were placed in this encounter.    Derek Jack, MD [place] [phone]   I, Thana Ates, am acting as a scribe for Dr. Derek Jack.  I, Derek Jack MD, have reviewed the above documentation for accuracy and completeness, and I agree with the above.

## 2020-11-29 ENCOUNTER — Inpatient Hospital Stay (HOSPITAL_BASED_OUTPATIENT_CLINIC_OR_DEPARTMENT_OTHER): Payer: 59 | Admitting: Hematology

## 2020-11-29 VITALS — BP 124/81 | HR 75 | Temp 97.7°F | Resp 18 | Wt 230.4 lb

## 2020-11-29 DIAGNOSIS — Z23 Encounter for immunization: Secondary | ICD-10-CM

## 2020-11-29 DIAGNOSIS — C8100 Nodular lymphocyte predominant Hodgkin lymphoma, unspecified site: Secondary | ICD-10-CM | POA: Diagnosis not present

## 2020-11-29 DIAGNOSIS — C8108 Nodular lymphocyte predominant Hodgkin lymphoma, lymph nodes of multiple sites: Secondary | ICD-10-CM | POA: Diagnosis not present

## 2020-11-29 MED ORDER — INFLUENZA VAC SPLIT QUAD 0.5 ML IM SUSY
0.5000 mL | PREFILLED_SYRINGE | Freq: Once | INTRAMUSCULAR | Status: AC
  Administered 2020-11-29: 0.5 mL via INTRAMUSCULAR
  Filled 2020-11-29: qty 0.5

## 2020-11-29 NOTE — Patient Instructions (Addendum)
[place] at [facility] Discharge Instructions  You were seen and examined today by Dr. Delton Coombes. He reviewed your most recent labs and everything looks good your white blood cell count remains low. Please keep follow up as scheduled.   Thank you for choosing [place] at [facility] to provide your oncology and hematology care.  To afford each patient quality time with our provider, please arrive at least 15 minutes before your scheduled appointment time.   If you have a lab appointment with the [facility] please come in thru the Main Entrance and check in at the main information desk.  You need to re-schedule your appointment should you arrive 10 or more minutes late.  We strive to give you quality time with our providers, and arriving late affects you and other patients whose appointments are after yours.  Also, if you no show three or more times for appointments you may be dismissed from the clinic at the providers discretion.     Again, thank you for choosing [facility].  Our hope is that these requests will decrease the amount of time that you wait before being seen by our physicians.       _____________________________________________________________  Should you have questions after your visit to [facility], please contact our office at [phone] and follow the prompts.  Our office hours are 8:00 a.m. and 4:30 p.m. Monday - Friday.  Please note that voicemails left after 4:00 p.m. may not be returned until the following business day.  We are closed weekends and major holidays.  You do have access to a nurse 24-7, just call the main number to the clinic [phone] and do not press any options, hold on the line and a nurse will answer the phone.    For prescription refill requests, have your pharmacy contact our office and allow 72 hours.    Due to Covid, you will need to wear a mask upon entering the  hospital. If you do not have a mask, a mask will be given to you at the Main Entrance upon arrival. For doctor visits, patients may have 1 support person age 2 or older with them. For treatment visits, patients can not have anyone with them due to social distancing guidelines and our immunocompromised population.

## 2020-11-29 NOTE — Progress Notes (Signed)
Patient in today for office visit. Flu vaccine ordered. See MAR for administration information. Patient stable throughout injection. Patient discharged from clinic ambulatory and in stable condition.

## 2020-12-26 ENCOUNTER — Encounter: Payer: Self-pay | Admitting: Family Medicine

## 2020-12-26 ENCOUNTER — Ambulatory Visit (INDEPENDENT_AMBULATORY_CARE_PROVIDER_SITE_OTHER): Payer: 59 | Admitting: Family Medicine

## 2020-12-26 ENCOUNTER — Other Ambulatory Visit: Payer: Self-pay

## 2020-12-26 VITALS — BP 109/71 | HR 67 | Temp 97.3°F | Ht 73.0 in | Wt 232.4 lb

## 2020-12-26 DIAGNOSIS — E119 Type 2 diabetes mellitus without complications: Secondary | ICD-10-CM | POA: Diagnosis not present

## 2020-12-26 DIAGNOSIS — E782 Mixed hyperlipidemia: Secondary | ICD-10-CM

## 2020-12-26 DIAGNOSIS — I10 Essential (primary) hypertension: Secondary | ICD-10-CM

## 2020-12-26 LAB — BAYER DCA HB A1C WAIVED: HB A1C (BAYER DCA - WAIVED): 7 % — ABNORMAL HIGH (ref 4.8–5.6)

## 2020-12-26 NOTE — Progress Notes (Signed)
Subjective:  Patient ID: Devin Tran,  male    DOB: [birth year]  Age: 55 y.o.    CC: Medical Management of Chronic Issues   HPI DORSEL FLINN presents for  follow-up of hypertension. Patient has no history of headache chest pain or shortness of breath or recent cough. Patient also denies symptoms of TIA such as numbness weakness lateralizing. Patient denies side effects from medication. States taking it regularly.  Patient also  in for follow-up of elevated cholesterol. Doing well without complaints on current medication. Denies side effects  including myalgia and arthralgia and nausea. Also in today for liver function testing. Currently no chest pain, shortness of breath or other cardiovascular related symptoms noted.  Follow-up of diabetes. Patient does check blood sugar at home. Readings run between 140-160 Fasting and low 100s prandial Patient denies symptoms such as excessive hunger or urinary frequency, excessive hunger, nausea No significant hypoglycemic spells noted. Medications reviewed. Pt reports taking them regularly. Pt. denies complication/adverse reaction today.    History Khaidyn has a past medical history of Complication of anesthesia, Erectile dysfunction, Hodgkin's lymphoma ([place]), Hyperlipidemia, Hypertension, Infertility, Low testosterone, and PONV (postoperative nausea and vomiting).   He has a past surgical history that includes Splenectomy (Left, Over 20 years ago); PORTA CATH INSERTION; PORTA CATH REMOVAL; Axillary lymph node biopsy (Right, 05/10/2018); Portacath placement (Left, 05/21/2018); and IR LYMPHANGIOGRAM EXTREMITY UNI LEFT (03/02/1987).   His family history includes COPD in his father; Cancer in his father and paternal uncle; Diabetes in his father; Hypertension in his brother, father, and mother; Stroke in his brother.He reports that he has never smoked. He has never used smokeless tobacco. He reports that he does not drink alcohol and does not use  drugs.  Current Outpatient Medications on File Prior to Visit  Medication Sig Dispense Refill   Accu-Chek Softclix Lancets lancets SMARTSIG:2 Topical Twice Daily PRN     amLODipine (NORVASC) 10 MG tablet Take 1 tablet (10 mg total) by mouth daily. 90 tablet 3   Ascorbic Acid (VITAMIN C) 1000 MG tablet Take 1,000 mg by mouth daily. Vitamin c lozenge     aspirin EC 81 MG tablet Take 81 mg by mouth daily.      blood glucose meter kit and supplies KIT Dispense based on patient and insurance preference. Use up to four times daily as directed. (FOR ICD-10 : E11.9 1 each 0   glucose blood (ACCU-CHEK GUIDE) test strip CHECK SUGAR UP TO 4 TIMES DAILY Dx E11.9 400 strip 3   lidocaine-prilocaine (EMLA) cream Apply pea-sized amount to port a cath site and cover with plastic wrap one hour prior to each chemotherapy appointment 30 g 3   metFORMIN (GLUCOPHAGE-XR) 750 MG 24 hr tablet TAKE 1 TABLET BY MOUTH EVERY DAY WITH BREAKFAST 90 tablet 3   Multiple Vitamins-Minerals (ONE-A-DAY MENS HEALTH FORMULA PO) Take 1 tablet by mouth daily.      rosuvastatin (CRESTOR) 10 MG tablet TAKE 1 TABLET BY MOUTH DAILY. FOR CHOLESTEROL 90 tablet 3   VITAMIN D PO Take by mouth once.     No current facility-administered medications on file prior to visit.    ROS Review of Systems  Constitutional:  Negative for fever.  Respiratory:  Negative for shortness of breath.   Cardiovascular:  Negative for chest pain.  Musculoskeletal:  Negative for arthralgias.  Skin:  Negative for rash.   Objective:  BP 109/71   Pulse 67   Temp (!) 97.3 F (36.3 C)  Ht _0  (1.854 m)   Wt 232 lb 6.4 oz (105.4 kg)   SpO2 97%   BMI 30.66 kg/m   BP Readings from Last 3 Encounters:  12/26/20 109/71  11/29/20 124/81  09/25/20 135/90    Wt Readings from Last 3 Encounters:  12/26/20 232 lb 6.4 oz (105.4 kg)  11/29/20 230 lb 6.1 oz (104.5 kg)  09/25/20 228 lb 9.6 oz (103.7 kg)     Physical Exam Vitals reviewed.  Constitutional:       Appearance: He is well-developed.  HENT:     Head: Normocephalic and atraumatic.     Right Ear: External ear normal.     Left Ear: External ear normal.     Mouth/Throat:     Pharynx: No oropharyngeal exudate or posterior oropharyngeal erythema.  Eyes:     Pupils: Pupils are equal, round, and reactive to light.  Cardiovascular:     Rate and Rhythm: Normal rate and regular rhythm.     Heart sounds: No murmur heard. Pulmonary:     Effort: No respiratory distress.     Breath sounds: Normal breath sounds.  Musculoskeletal:     Cervical back: Normal range of motion and neck supple.  Neurological:     Mental Status: He is alert and oriented to person, place, and time.    Diabetic Foot Exam - Simple   No data filed       Assessment & Plan:   Yogi was seen today for medical management of chronic issues.  Diagnoses and all orders for this visit:  Mixed hyperlipidemia -     Lipid panel  Type 2 diabetes mellitus without complication, without long-term current use of insulin (HCC) -     Bayer DCA Hb A1c Waived  Essential hypertension -     CBC with Differential/Platelet -     CMP14+EGFR  I am having Yuuki L. Owens Shark maintain his aspirin EC, Multiple Vitamins-Minerals (ONE-A-DAY MENS HEALTH FORMULA PO), lidocaine-prilocaine, vitamin C, VITAMIN D PO, blood glucose meter kit and supplies, Accu-Chek Softclix Lancets, Accu-Chek Guide, amLODipine, metFORMIN, and rosuvastatin.  No orders of the defined types were placed in this encounter.    Follow-up: Return in about 3 months (around 03/28/2021).  [name], M.D.

## 2020-12-27 ENCOUNTER — Encounter (HOSPITAL_COMMUNITY): Payer: Self-pay | Admitting: Hematology

## 2020-12-27 LAB — CMP14+EGFR
ALT: 25 IU/L (ref 0–44)
AST: 20 IU/L (ref 0–40)
Albumin/Globulin Ratio: 2.7 — ABNORMAL HIGH (ref 1.2–2.2)
Albumin: 4.8 g/dL (ref 3.8–4.9)
Alkaline Phosphatase: 78 IU/L (ref 44–121)
BUN/Creatinine Ratio: 11 (ref 9–20)
BUN: 13 mg/dL (ref 6–24)
Bilirubin Total: 0.3 mg/dL (ref 0.0–1.2)
CO2: 26 mmol/L (ref 20–29)
Calcium: 9.2 mg/dL (ref 8.7–10.2)
Chloride: 105 mmol/L (ref 96–106)
Creatinine, Ser: 1.16 mg/dL (ref 0.76–1.27)
Globulin, Total: 1.8 g/dL (ref 1.5–4.5)
Glucose: 105 mg/dL — ABNORMAL HIGH (ref 70–99)
Potassium: 4.2 mmol/L (ref 3.5–5.2)
Sodium: 142 mmol/L (ref 134–144)
Total Protein: 6.6 g/dL (ref 6.0–8.5)
eGFR: 75 mL/min/{1.73_m2} (ref >59)

## 2020-12-27 LAB — CBC WITH DIFFERENTIAL/PLATELET
Basophils Absolute: 0 10*3/uL (ref 0.0–0.2)
Basos: 1 %
EOS (ABSOLUTE): 0.1 10*3/uL (ref 0.0–0.4)
Eos: 2 %
Hematocrit: 42 % (ref 37.5–51.0)
Hemoglobin: 13.8 g/dL (ref 13.0–17.7)
Immature Grans (Abs): 0 10*3/uL (ref 0.0–0.1)
Immature Granulocytes: 0 %
Lymphocytes Absolute: 1.4 10*3/uL (ref 0.7–3.1)
Lymphs: 43 %
MCH: 27.4 pg (ref 26.6–33.0)
MCHC: 32.9 g/dL (ref 31.5–35.7)
MCV: 83 fL (ref 79–97)
Monocytes Absolute: 0.6 10*3/uL (ref 0.1–0.9)
Monocytes: 17 %
Neutrophils Absolute: 1.2 10*3/uL — ABNORMAL LOW (ref 1.4–7.0)
Neutrophils: 37 %
Platelets: 230 10*3/uL (ref 150–450)
RBC: 5.04 x10E6/uL (ref 4.14–5.80)
RDW: 14.4 % (ref 11.6–15.4)
WBC: 3.3 10*3/uL — ABNORMAL LOW (ref 3.4–10.8)

## 2020-12-27 LAB — LIPID PANEL
Chol/HDL Ratio: 2.8 ratio (ref 0.0–5.0)
Cholesterol, Total: 109 mg/dL (ref 100–199)
HDL: 39 mg/dL — ABNORMAL LOW (ref >39)
LDL Chol Calc (NIH): 53 mg/dL (ref 0–99)
Triglycerides: 89 mg/dL (ref 0–149)
VLDL Cholesterol Cal: 17 mg/dL (ref 5–40)

## 2020-12-31 NOTE — Progress Notes (Signed)
Hello Devin Tran,  Your lab result is normal and/or stable.Some minor variations that are not significant are commonly marked abnormal, but do not represent any medical problem for you.  Best regards, Monchel Pollitt, M.D.

## 2021-03-04 ENCOUNTER — Inpatient Hospital Stay (HOSPITAL_COMMUNITY): Payer: 59 | Attending: Hematology

## 2021-03-04 ENCOUNTER — Other Ambulatory Visit: Payer: Self-pay

## 2021-03-04 ENCOUNTER — Encounter (HOSPITAL_COMMUNITY): Payer: Self-pay

## 2021-03-04 VITALS — BP 131/89 | HR 79 | Temp 97.7°F | Resp 18

## 2021-03-04 DIAGNOSIS — Z8571 Personal history of Hodgkin lymphoma: Secondary | ICD-10-CM | POA: Insufficient documentation

## 2021-03-04 DIAGNOSIS — Z95828 Presence of other vascular implants and grafts: Secondary | ICD-10-CM

## 2021-03-04 MED ORDER — SODIUM CHLORIDE 0.9% FLUSH
10.0000 mL | Freq: Once | INTRAVENOUS | Status: AC
  Administered 2021-03-04: 10 mL via INTRAVENOUS

## 2021-03-04 MED ORDER — HEPARIN SOD (PORK) LOCK FLUSH 100 UNIT/ML IV SOLN
500.0000 [IU] | Freq: Once | INTRAVENOUS | Status: AC
  Administered 2021-03-04: 500 [IU] via INTRAVENOUS

## 2021-03-04 NOTE — Progress Notes (Signed)
Port flushed with no blood return noted, patient able to taste saline flush. No bruising or swelling at site. Bandaid applied and patient discharged in satisfactory condition. VVS stable with no signs or symptoms of distressed noted.

## 2021-03-04 NOTE — Patient Instructions (Signed)
[facility]  Discharge Instructions: ?Thank you for choosing [facility] to provide your oncology and hematology care.  ?If you have a lab appointment with the Cancer Center, please come in thru the Main Entrance and check in at the main information desk. ? ?Wear comfortable clothing and clothing appropriate for easy access to any Portacath or PICC line.  ? ?We strive to give you quality time with your provider. You may need to reschedule your appointment if you arrive late (15 or more minutes).  Arriving late affects you and other patients whose appointments are after yours.  Also, if you miss three or more appointments without notifying the office, you may be dismissed from the clinic at the provider?s discretion.    ?  ?For prescription refill requests, have your pharmacy contact our office and allow 72 hours for refills to be completed.   ? ?Today your port was flushed, return as scheduled. ?  ?To help prevent nausea and vomiting after your treatment, we encourage you to take your nausea medication as directed. ? ?BELOW ARE SYMPTOMS THAT SHOULD BE REPORTED IMMEDIATELY: ?*FEVER GREATER THAN 100.4 F (38 ?C) OR HIGHER ?*CHILLS OR SWEATING ?*NAUSEA AND VOMITING THAT IS NOT CONTROLLED WITH YOUR NAUSEA MEDICATION ?*UNUSUAL SHORTNESS OF BREATH ?*UNUSUAL BRUISING OR BLEEDING ?*URINARY PROBLEMS (pain or burning when urinating, or frequent urination) ?*BOWEL PROBLEMS (unusual diarrhea, constipation, pain near the anus) ?TENDERNESS IN MOUTH AND THROAT WITH OR WITHOUT PRESENCE OF ULCERS (sore throat, sores in mouth, or a toothache) ?UNUSUAL RASH, SWELLING OR PAIN  ?UNUSUAL VAGINAL DISCHARGE OR ITCHING  ? ?Items with * indicate a potential emergency and should be followed up as soon as possible or go to the Emergency Department if any problems should occur. ? ?Please show the CHEMOTHERAPY ALERT CARD or IMMUNOTHERAPY ALERT CARD at check-in to the Emergency Department and triage nurse. ? ?Should you  have questions after your visit or need to cancel or reschedule your appointment, please contact [facility] [phone]  and follow the prompts.  Office hours are 8:00 a.m. to 4:30 p.m. Monday - Friday. Please note that voicemails left after 4:00 p.m. may not be returned until the following business day.  We are closed weekends and major holidays. You have access to a nurse at all times for urgent questions. Please call the main number to the clinic [phone] and follow the prompts. ? ?For any non-urgent questions, you may also contact your provider using MyChart. We now offer e-Visits for anyone 18 and older to request care online for non-urgent symptoms. For details visit [email or web address]. ?  ?Also download the MyChart app! Go to the app store, search "MyChart", open the app, select [facility], and log in with your MyChart username and password. ? ?Due to Covid, a mask is required upon entering the hospital/clinic. If you do not have a mask, one will be given to you upon arrival. For doctor visits, patients may have 1 support person aged 18 or older with them. For treatment visits, patients cannot have anyone with them due to current Covid guidelines and our immunocompromised population.  ?

## 2021-03-18 IMAGING — PT NUCLEAR MEDICINE PET IMAGE INITIAL (PI) SKULL BASE TO THIGH
1 of 8 series · 3 of 16 positions shown, 4 images · non-contrast
Comparison: Chest CT on 05/04/2018

CLINICAL DATA: Initial treatment strategy for Hodgkin lymphoma.

EXAM:
NUCLEAR MEDICINE PET SKULL BASE TO THIGH
TECHNIQUE: 11.5 mCi F-18 FDG was injected intravenously. Full-ring PET imaging
was performed from the skull base to thigh after the radiotracer. CT
data was obtained and used for attenuation correction and anatomic
localization.
Fasting blood glucose: 106 mg/dl

[Series 4: ct sk_thigh 5.0 b31f · axial · 0.98mm/px · z∈[-878,+74]mm · 3 of 239 slices shown, 4 images]
[im 1/239  soft-tissue]
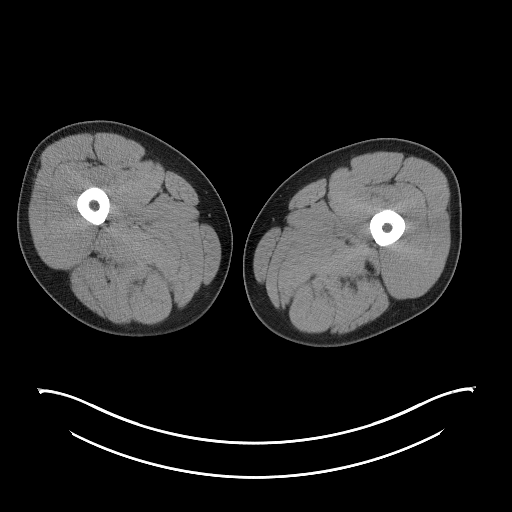
[im 1/239  bone]
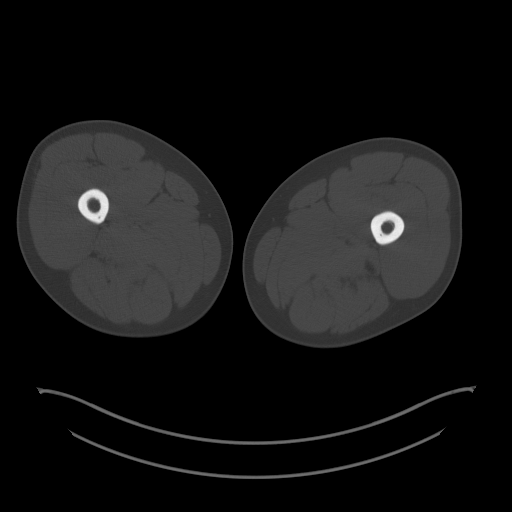
[im 120/239  soft-tissue]
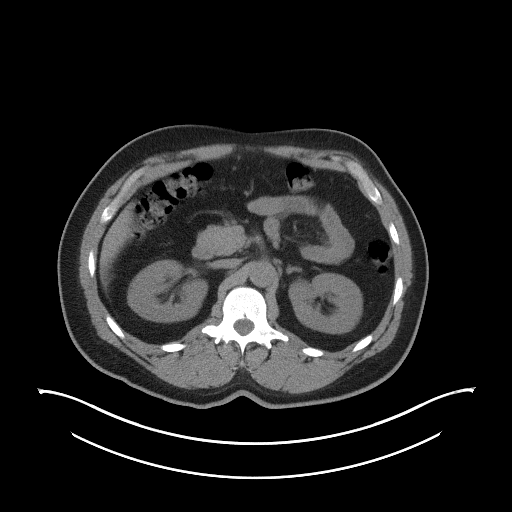
[im 239/239  soft-tissue]
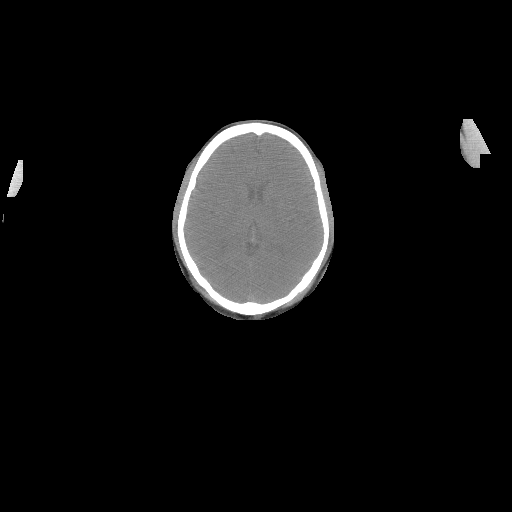

[3 of 16 positions shown; findings below may reference images not displayed]

FINDINGS: (Background mediastinal blood pool activity: SUV max = 2.8)

NECK: 1.3 cm right supraclavicular lymph node is seen on image 47/4,
with SUV max of 4.5.

Incidental CT findings:  None.

CHEST: Hypermetabolic lymphadenopathy is seen in the axillary and
subpectoral regions bilaterally, right side greater than left. Index
lymph node in the right axilla on image 65/4 measures 2.6 cm in
short axis, with SUV max of 7.0. No hypermetabolic mediastinal or
hilar lymph nodes. No suspicious pulmonary nodules or masses
identified.

Incidental CT findings:  None.

ABDOMEN/PELVIS: No abnormal hypermetabolic activity within the
liver, pancreas, or adrenal glands. Previous splenectomy noted.

8 mm hypermetabolic portacaval lymph node is seen on image 113/4,
with SUV max of 5.7. No other hypermetabolic lymph nodes identified
within the abdomen or pelvis.

Incidental CT findings:  Benign bilateral renal cysts.

SKELETON: No focal hypermetabolic bone lesions to suggest skeletal
metastasis.

Incidental CT findings: 2 tiny sclerotic bone lesions in the lumbar
spine show no FDG uptake, most consistent with benign bone islands.
IMPRESSION: Hypermetabolic lymphadenopathy in bilateral axillary and subpectoral
regions, right supraclavicular region, and portacaval space,
consistent with recently diagnosed Hodgkin lymphoma.

## 2021-04-15 ENCOUNTER — Other Ambulatory Visit: Payer: Self-pay | Admitting: Family Medicine

## 2021-05-20 ENCOUNTER — Encounter (HOSPITAL_COMMUNITY): Payer: Self-pay | Admitting: Hematology

## 2021-05-23 ENCOUNTER — Ambulatory Visit (HOSPITAL_COMMUNITY)
Admission: RE | Admit: 2021-05-23 | Discharge: 2021-05-23 | Disposition: A | Discharge status: Home / Self Care | Payer: 59 | Source: Ambulatory Visit | Attending: Hematology | Admitting: Hematology

## 2021-05-23 ENCOUNTER — Inpatient Hospital Stay (HOSPITAL_COMMUNITY): Payer: 59 | Attending: Hematology

## 2021-05-23 ENCOUNTER — Other Ambulatory Visit (HOSPITAL_COMMUNITY): Payer: 59

## 2021-05-23 DIAGNOSIS — D72819 Decreased white blood cell count, unspecified: Secondary | ICD-10-CM | POA: Insufficient documentation

## 2021-05-23 DIAGNOSIS — R590 Localized enlarged lymph nodes: Secondary | ICD-10-CM | POA: Insufficient documentation

## 2021-05-23 DIAGNOSIS — E119 Type 2 diabetes mellitus without complications: Secondary | ICD-10-CM | POA: Insufficient documentation

## 2021-05-23 DIAGNOSIS — Z8571 Personal history of Hodgkin lymphoma: Secondary | ICD-10-CM | POA: Insufficient documentation

## 2021-05-23 DIAGNOSIS — R200 Anesthesia of skin: Secondary | ICD-10-CM | POA: Insufficient documentation

## 2021-05-23 DIAGNOSIS — C8108 Nodular lymphocyte predominant Hodgkin lymphoma, lymph nodes of multiple sites: Secondary | ICD-10-CM | POA: Insufficient documentation

## 2021-05-23 DIAGNOSIS — Z7984 Long term (current) use of oral hypoglycemic drugs: Secondary | ICD-10-CM | POA: Insufficient documentation

## 2021-05-23 LAB — CBC WITH DIFFERENTIAL/PLATELET
Abs Immature Granulocytes: 0.01 10*3/uL (ref 0.00–0.07)
Basophils Absolute: 0 10*3/uL (ref 0.0–0.1)
Basophils Relative: 0 %
Eosinophils Absolute: 0.1 10*3/uL (ref 0.0–0.5)
Eosinophils Relative: 2 %
HCT: 42 % (ref 39.0–52.0)
Hemoglobin: 13.8 g/dL (ref 13.0–17.0)
Immature Granulocytes: 0 %
Lymphocytes Relative: 44 %
Lymphs Abs: 1.3 10*3/uL (ref 0.7–4.0)
MCH: 27.7 pg (ref 26.0–34.0)
MCHC: 32.9 g/dL (ref 30.0–36.0)
MCV: 84.2 fL (ref 80.0–100.0)
Monocytes Absolute: 0.5 10*3/uL (ref 0.1–1.0)
Monocytes Relative: 19 %
Neutro Abs: 1 10*3/uL — ABNORMAL LOW (ref 1.7–7.7)
Neutrophils Relative %: 35 %
Platelets: 207 10*3/uL (ref 150–400)
RBC: 4.99 MIL/uL (ref 4.22–5.81)
RDW: 14.9 % (ref 11.5–15.5)
WBC: 2.9 10*3/uL — ABNORMAL LOW (ref 4.0–10.5)
nRBC: 0 % (ref 0.0–0.2)

## 2021-05-23 LAB — COMPREHENSIVE METABOLIC PANEL
ALT: 26 U/L (ref 0–44)
AST: 21 U/L (ref 15–41)
Albumin: 4.4 g/dL (ref 3.5–5.0)
Alkaline Phosphatase: 74 U/L (ref 38–126)
Anion gap: 6 (ref 5–15)
BUN: 18 mg/dL (ref 6–20)
CO2: 26 mmol/L (ref 22–32)
Calcium: 9 mg/dL (ref 8.9–10.3)
Chloride: 107 mmol/L (ref 98–111)
Creatinine, Ser: 1.12 mg/dL (ref 0.61–1.24)
GFR, Estimated: >60 mL/min (ref >60)
Glucose, Bld: 114 mg/dL — ABNORMAL HIGH (ref 70–99)
Potassium: 3.6 mmol/L (ref 3.5–5.1)
Sodium: 139 mmol/L (ref 135–145)
Total Bilirubin: 0.7 mg/dL (ref 0.3–1.2)
Total Protein: 7.1 g/dL (ref 6.5–8.1)

## 2021-05-23 LAB — LACTATE DEHYDROGENASE: LDH: 153 U/L (ref 98–192)

## 2021-05-23 MED ORDER — IOHEXOL 9 MG/ML PO SOLN
ORAL | Status: AC
  Filled 2021-05-23: qty 1000

## 2021-05-23 MED ORDER — IOHEXOL 300 MG/ML  SOLN
100.0000 mL | Freq: Once | INTRAMUSCULAR | Status: AC | PRN
  Administered 2021-05-23: 100 mL via INTRAVENOUS

## 2021-05-30 ENCOUNTER — Inpatient Hospital Stay (HOSPITAL_COMMUNITY): Payer: 59

## 2021-05-30 ENCOUNTER — Inpatient Hospital Stay (HOSPITAL_BASED_OUTPATIENT_CLINIC_OR_DEPARTMENT_OTHER): Payer: 59 | Admitting: Hematology

## 2021-05-30 VITALS — BP 138/92 | HR 74 | Temp 98.0°F | Resp 18 | Ht 73.0 in | Wt 231.9 lb

## 2021-05-30 DIAGNOSIS — R200 Anesthesia of skin: Secondary | ICD-10-CM | POA: Diagnosis not present

## 2021-05-30 DIAGNOSIS — Z7984 Long term (current) use of oral hypoglycemic drugs: Secondary | ICD-10-CM | POA: Diagnosis not present

## 2021-05-30 DIAGNOSIS — C8108 Nodular lymphocyte predominant Hodgkin lymphoma, lymph nodes of multiple sites: Secondary | ICD-10-CM

## 2021-05-30 DIAGNOSIS — E119 Type 2 diabetes mellitus without complications: Secondary | ICD-10-CM | POA: Diagnosis not present

## 2021-05-30 DIAGNOSIS — R590 Localized enlarged lymph nodes: Secondary | ICD-10-CM | POA: Diagnosis not present

## 2021-05-30 DIAGNOSIS — Z8571 Personal history of Hodgkin lymphoma: Secondary | ICD-10-CM | POA: Diagnosis present

## 2021-05-30 DIAGNOSIS — D72819 Decreased white blood cell count, unspecified: Secondary | ICD-10-CM | POA: Diagnosis not present

## 2021-05-30 MED ORDER — SODIUM CHLORIDE 0.9% FLUSH
10.0000 mL | Freq: Once | INTRAVENOUS | Status: AC
  Administered 2021-05-30: 10 mL via INTRAVENOUS

## 2021-05-30 MED ORDER — HEPARIN SOD (PORK) LOCK FLUSH 100 UNIT/ML IV SOLN
500.0000 [IU] | Freq: Once | INTRAVENOUS | Status: AC
  Administered 2021-05-30: 500 [IU] via INTRAVENOUS

## 2021-05-30 NOTE — Patient Instructions (Addendum)
[place] at [facility] ?Discharge Instructions ? ?You were seen and examined today by Dr. Delton Coombes. ? ?Dr. Delton Coombes discussed your most recent lab work and CT scan which revealed no concerns for recurrent cancer. Your white blood cells are still a bit low, but this has been stable. ? ?Follow-up as scheduled. ? ? ?Thank you for choosing [place] at [facility] to provide your oncology and hematology care.  To afford each patient quality time with our provider, please arrive at least 15 minutes before your scheduled appointment time.  ? ?If you have a lab appointment with the [facility] please come in thru the Main Entrance and check in at the main information desk. ? ?You need to re-schedule your appointment should you arrive 10 or more minutes late.  We strive to give you quality time with our providers, and arriving late affects you and other patients whose appointments are after yours.  Also, if you no show three or more times for appointments you may be dismissed from the clinic at the providers discretion.     ?Again, thank you for choosing [facility].  Our hope is that these requests will decrease the amount of time that you wait before being seen by our physicians.       ?_____________________________________________________________ ? ?Should you have questions after your visit to [facility], please contact our office at [phone] and follow the prompts.  Our office hours are 8:00 a.m. and 4:30 p.m. Monday - Friday.  Please note that voicemails left after 4:00 p.m. may not be returned until the following business day.  We are closed weekends and major holidays.  You do have access to a nurse 24-7, just call the main number to the clinic [phone] and do not press any options, hold on the line and a nurse will answer the phone.   ? ?For prescription refill requests, have your pharmacy contact our office and allow 72  hours.   ? ?Due to Covid, you will need to wear a mask upon entering the hospital. If you do not have a mask, a mask will be given to you at the Main Entrance upon arrival. For doctor visits, patients may have 1 support person age 24 or older with them. For treatment visits, patients can not have anyone with them due to social distancing guidelines and our immunocompromised population.  ? ? ? ?

## 2021-05-30 NOTE — Patient Instructions (Signed)
[facility]  Discharge Instructions: ?Thank you for choosing [facility] to provide your oncology and hematology care.  ?If you have a lab appointment with the Cancer Center, please come in thru the Main Entrance and check in at the main information desk. ? ?Wear comfortable clothing and clothing appropriate for easy access to any Portacath or PICC line.  ? ?We strive to give you quality time with your provider. You may need to reschedule your appointment if you arrive late (15 or more minutes).  Arriving late affects you and other patients whose appointments are after yours.  Also, if you miss three or more appointments without notifying the office, you may be dismissed from the clinic at the provider?s discretion.    ?  ?For prescription refill requests, have your pharmacy contact our office and allow 72 hours for refills to be completed.   ? ?Today you received the following chemotherapy and/or immunotherapy agents Port flush    ?  ?To help prevent nausea and vomiting after your treatment, we encourage you to take your nausea medication as directed. ? ?BELOW ARE SYMPTOMS THAT SHOULD BE REPORTED IMMEDIATELY: ?*FEVER GREATER THAN 100.4 F (38 ?C) OR HIGHER ?*CHILLS OR SWEATING ?*NAUSEA AND VOMITING THAT IS NOT CONTROLLED WITH YOUR NAUSEA MEDICATION ?*UNUSUAL SHORTNESS OF BREATH ?*UNUSUAL BRUISING OR BLEEDING ?*URINARY PROBLEMS (pain or burning when urinating, or frequent urination) ?*BOWEL PROBLEMS (unusual diarrhea, constipation, pain near the anus) ?TENDERNESS IN MOUTH AND THROAT WITH OR WITHOUT PRESENCE OF ULCERS (sore throat, sores in mouth, or a toothache) ?UNUSUAL RASH, SWELLING OR PAIN  ?UNUSUAL VAGINAL DISCHARGE OR ITCHING  ? ?Items with * indicate a potential emergency and should be followed up as soon as possible or go to the Emergency Department if any problems should occur. ? ?Please show the CHEMOTHERAPY ALERT CARD or IMMUNOTHERAPY ALERT CARD at check-in to the Emergency  Department and triage nurse. ? ?Should you have questions after your visit or need to cancel or reschedule your appointment, please contact [facility] [phone]  and follow the prompts.  Office hours are 8:00 a.m. to 4:30 p.m. Monday - Friday. Please note that voicemails left after 4:00 p.m. may not be returned until the following business day.  We are closed weekends and major holidays. You have access to a nurse at all times for urgent questions. Please call the main number to the clinic [phone] and follow the prompts. ? ?For any non-urgent questions, you may also contact your provider using MyChart. We now offer e-Visits for anyone 18 and older to request care online for non-urgent symptoms. For details visit [email or web address]. ?  ?Also download the MyChart app! Go to the app store, search "MyChart", open the app, select [facility], and log in with your MyChart username and password. ? ?Due to Covid, a mask is required upon entering the hospital/clinic. If you do not have a mask, one will be given to you upon arrival. For doctor visits, patients may have 1 support person aged 18 or older with them. For treatment visits, patients cannot have anyone with them due to current Covid guidelines and our immunocompromised population.  ?

## 2021-05-30 NOTE — Progress Notes (Signed)
Patients port flushed without difficulty.  Good blood return noted with no bruising or swelling noted at site.  Band aid applied.  VSS with discharge and left in satisfactory condition with no s/s of distress noted.   

## 2021-05-30 NOTE — Progress Notes (Signed)
? ?[facility] ?[address] ?[place], [place] [address] ? ? ?CLINIC:  ?Medical Oncology/Hematology ? ?PCP:  ?[name], MD ?[address] [place] [address] ?[phone] ? ? 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 ?PRIOR THERAPY:  ?1. R-CHOP x 6 cycles from 05/24/2018 to 09/14/2018. ?2. XRT to right axillary lymph nodes around 12/2018. ? ?CURRENT THERAPY: surveillance ? ?BRIEF ONCOLOGIC HISTORY:  ?Oncology History  ?Nodular lymphocyte predom Hodgkin lymphoma lymph nodes multiple sites ([place])  ?05/25/2018 Initial Diagnosis  ? Nodular lymphocyte predom Hodgkin lymphoma lymph nodes multiple sites [facility]) ? ?  ?06/01/2018 -  Chemotherapy  ? The patient had DOXOrubicin (ADRIAMYCIN) chemo injection 118 mg, 50 mg/m2 = 118 mg, Intravenous,  Once, 6 of 6 cycles ?Administration: 118 mg (06/01/2018), 118 mg (06/22/2018), 118 mg (07/13/2018), 118 mg (08/03/2018), 118 mg (08/24/2018), 118 mg (09/14/2018) ?palonosetron (ALOXI) injection 0.25 mg, 0.25 mg, Intravenous,  Once, 6 of 6 cycles ?Administration: 0.25 mg (06/01/2018), 0.25 mg (06/22/2018), 0.25 mg (07/13/2018), 0.25 mg (08/03/2018), 0.25 mg (08/24/2018), 0.25 mg (09/14/2018) ?pegfilgrastim (NEULASTA ONPRO KIT) injection 6 mg, 6 mg, Subcutaneous, Once, 6 of 6 cycles ?Administration: 6 mg (06/01/2018), 6 mg (06/22/2018), 6 mg (07/13/2018), 6 mg (08/03/2018), 6 mg (08/24/2018), 6 mg (09/14/2018) ?vinCRIStine (ONCOVIN) 2 mg in sodium chloride 0.9 % 50 mL chemo infusion, 2 mg, Intravenous,  Once, 6 of 6 cycles ?Administration: 2 mg (06/01/2018), 2 mg (06/22/2018), 2 mg (07/13/2018), 2 mg (08/03/2018), 2 mg (08/24/2018), 2 mg (09/14/2018) ?riTUXimab (RITUXAN) 900 mg in sodium chloride 0.9 % 250 mL (2.6471 mg/mL) infusion, 375 mg/m2 = 900 mg, Intravenous,  Once, 1 of 1 cycle ?Administration: 900 mg (06/01/2018) ?cyclophosphamide (CYTOXAN) 1,760 mg in sodium chloride 0.9 % 250 mL chemo infusion, 750 mg/m2 = 1,760 mg, Intravenous,  Once,  6 of 6 cycles ?Administration: 1,760 mg (06/01/2018), 1,760 mg (06/22/2018), 1,760 mg (07/13/2018), 1,760 mg (08/03/2018), 1,760 mg (08/24/2018), 1,760 mg (09/14/2018) ?riTUXimab (RITUXAN) 900 mg in sodium chloride 0.9 % 160 mL infusion, 375 mg/m2 = 900 mg (100 % of original dose 375 mg/m2), Intravenous,  Once, 5 of 5 cycles ?Dose modification: 375 mg/m2 (original dose 375 mg/m2, Cycle 2) ?Administration: 900 mg (06/22/2018), 900 mg (07/13/2018), 900 mg (08/03/2018), 900 mg (08/24/2018), 900 mg (09/14/2018) ?dexrazoxane (ZINECARD) 1,180 mg in lactated ringers 300 mL IVPB, 500 mg/m2 = 1,180 mg (100 % of original dose 500 mg/m2), Intravenous,  Once, 3 of 3 cycles ?Dose modification: 500 mg/m2 (original dose 500 mg/m2, Cycle 4), 500 mg/m2 (original dose 500 mg/m2, Cycle 4) ?Administration: 1,180 mg (08/24/2018), 1,180 mg (08/03/2018), 1,180 mg (09/14/2018) ? ? for chemotherapy treatment.  ? ?  ? ? ?CANCER STAGING: ? Cancer Staging  ?No matching staging information was found for the patient. ? ?INTERVAL HISTORY:  ?[name], a 56 y.o. 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 Aram was last seen on 11/29/2020.  ? ?Today he reports feeling good. He reports hoarseness. He denies history of seasonal allergies. He denies infections in the past 6 months. He continues to take metformin daily. The numbness in his hands and feet are stable. He denies fevers, night sweats, and unintentional weight loss. He reports he is trying to lose weight.  ? ?REVIEW OF SYSTEMS:  ?Review of Systems  ?Constitutional:  Negative for appetite change, fatigue, fever and unexpected weight change.  ?HENT:   Positive for voice change (hoarse).   ?  Endocrine: Negative for hot flashes.  ?Neurological:  Negative for numbness.  ?All other systems reviewed and are negative. ? ?PAST MEDICAL/SURGICAL HISTORY:  ?Past Medical History:  ?Diagnosis Date  ? Complication of anesthesia   ? PONV per anesth notes   ? Erectile dysfunction   ? Hodgkin's lymphoma ([place])   ? Hyperlipidemia   ? Hypertension   ? Infertility   ? Low testosterone   ? PONV (postoperative nausea and vomiting)   ? ?Past Surgical History:  ?Procedure Laterality Date  ? AXILLARY LYMPH NODE BIOPSY Right 05/10/2018  ? Procedure: AXILLARY LYMPH NODE BIOPSY, RIGHT;  Surgeon: Aviva Signs, MD;  Location: [facility];  Service: General;  Laterality: Right;  ? IR LYMPHANGIOGRAM EXTREMITY UNI LEFT  03/02/1987  ? PORTA CATH INSERTION    ? PORTA CATH REMOVAL    ? PORTACATH PLACEMENT Left 05/21/2018  ? Procedure: INSERTION PORT-A-CATH (attached catheter in left subclavian);  Surgeon: Aviva Signs, MD;  Location: [facility];  Service: General;  Laterality: Left;  ? SPLENECTOMY Left Over 20 years ago  ? Due to Hodgkin's lymphoma  ? ? ?SOCIAL HISTORY:  ?Social History  ? ?Socioeconomic History  ? Marital status: Single  ?  Spouse name: Not on file  ? Number of children: Not on file  ? Years of education: Not on file  ? Highest education level: Not on file  ?Occupational History  ? Occupation: Drive standup truck  ?  Employer: [employer]  ?Tobacco Use  ? Smoking status: Never  ? Smokeless tobacco: Never  ?Vaping Use  ? Vaping Use: Not on file  ?Substance and Sexual Activity  ? Alcohol use: No  ? Drug use: No  ? Sexual activity: Yes  ?Other Topics Concern  ? Not on file  ?Social History Narrative  ? Not on file  ? ?Social Determinants of Health  ? ?Financial Resource Strain: Not on file  ?Food Insecurity: Not on file  ?Transportation Needs: Not on file  ?Physical Activity: Not on file  ?Stress: Not on file  ?Social Connections: Not on file  ?Intimate Partner Violence: Not on file  ? ? ?FAMILY HISTORY:  ?Family History  ?Problem Relation Age of Onset  ? Hypertension Mother   ? Cancer Father   ?     lung  ? Hypertension Father   ? Diabetes Father   ? COPD Father   ? Hypertension Brother   ? Stroke Brother   ? Cancer Paternal Uncle   ?     possibly throat cancer  ? Colon cancer Neg  Hx   ? Esophageal cancer Neg Hx   ? Rectal cancer Neg Hx   ? Stomach cancer Neg Hx   ? ? ?CURRENT MEDICATIONS:  ?Current Outpatient Medications  ?Medication Sig Dispense Refill  ? ACCU-CHEK GUIDE test strip CHECK SUGAR UP TO 4 TIMES DAILY DX E11.9 400 strip 3  ? Accu-Chek Softclix Lancets lancets SMARTSIG:2 Topical Twice Daily PRN    ? amLODipine (NORVASC) 10 MG tablet Take 1 tablet (10 mg total) by mouth daily. 90 tablet 3  ? Ascorbic Acid (VITAMIN C) 1000 MG tablet Take 1,000 mg by mouth daily. Vitamin c lozenge    ? aspirin EC 81 MG tablet Take 81 mg by mouth daily.     ? blood glucose meter kit and supplies KIT Dispense based on patient and insurance preference. Use up to four times daily as directed. (FOR ICD-10 : E11.9 1 each 0  ? lidocaine-prilocaine (EMLA) cream Apply pea-sized  amount to port a cath site and cover with plastic wrap one hour prior to each chemotherapy appointment 30 g 3  ? metFORMIN (GLUCOPHAGE-XR) 750 MG 24 hr tablet TAKE 1 TABLET BY MOUTH EVERY DAY WITH BREAKFAST 90 tablet 3  ? Multiple Vitamins-Minerals (ONE-A-DAY MENS HEALTH FORMULA PO) Take 1 tablet by mouth daily.     ? rosuvastatin (CRESTOR) 10 MG tablet TAKE 1 TABLET BY MOUTH DAILY. FOR CHOLESTEROL 90 tablet 3  ? VITAMIN D PO Take by mouth once.    ? ?No current facility-administered medications for this visit.  ? ? ?ALLERGIES:  ?Allergies  ?Allergen Reactions  ? Bleomycin Other (See Comments)  ?  Cramping all over  ? ? ?PHYSICAL EXAM:  ?Performance status (ECOG): 1 - Symptomatic but completely ambulatory ? ?Vitals:  ? 05/30/21 1548  ?BP: (!) 138/92  ?Pulse: 74  ?Resp: 18  ?Temp: 98 ?F (36.7 ?C)  ?SpO2: 97%  ? ?Wt Readings from Last 3 Encounters:  ?05/30/21 231 lb 14.4 oz (105.2 kg)  ?12/26/20 232 lb 6.4 oz (105.4 kg)  ?11/29/20 230 lb 6.1 oz (104.5 kg)  ? ?Physical Exam ?Vitals reviewed.  ?Constitutional:   ?   Appearance: Normal appearance.  ?Cardiovascular:  ?   Rate and Rhythm: Normal rate and regular rhythm.  ?   Pulses: Normal  pulses.  ?   Heart sounds: Normal heart sounds.  ?Pulmonary:  ?   Effort: Pulmonary effort is normal.  ?   Breath sounds: Normal breath sounds.  ?Abdominal:  ?   Palpations: Abdomen is soft. There is n

## 2021-06-18 ENCOUNTER — Encounter: Payer: Self-pay | Admitting: General Surgery

## 2021-06-18 ENCOUNTER — Ambulatory Visit (INDEPENDENT_AMBULATORY_CARE_PROVIDER_SITE_OTHER): Payer: 59 | Admitting: General Surgery

## 2021-06-18 VITALS — BP 114/77 | HR 70 | Temp 98.4°F | Resp 12 | Ht 73.0 in | Wt 231.0 lb

## 2021-06-18 DIAGNOSIS — C8190 Hodgkin lymphoma, unspecified, unspecified site: Secondary | ICD-10-CM | POA: Diagnosis not present

## 2021-06-18 NOTE — Progress Notes (Signed)
Devin Tran; [id number]; Jul 28, 1965 ? ? ?HPI ?Patient is a 56 year old black male who was referred back to my care by Dr. Delton Coombes of oncology for Port-A-Cath removal.  He has finished with his chemotherapy for Hodgkin's lymphoma. ?Past Medical History:  ?Diagnosis Date  ? Complication of anesthesia   ? PONV per anesth notes  ? Erectile dysfunction   ? Hodgkin's lymphoma ([place])   ? Hyperlipidemia   ? Hypertension   ? Infertility   ? Low testosterone   ? PONV (postoperative nausea and vomiting)   ? ? ?Past Surgical History:  ?Procedure Laterality Date  ? AXILLARY LYMPH NODE BIOPSY Right 05/10/2018  ? Procedure: AXILLARY LYMPH NODE BIOPSY, RIGHT;  Surgeon: Aviva Signs, MD;  Location: [facility];  Service: General;  Laterality: Right;  ? IR LYMPHANGIOGRAM EXTREMITY UNI LEFT  03/02/1987  ? PORTA CATH INSERTION    ? PORTA CATH REMOVAL    ? PORTACATH PLACEMENT Left 05/21/2018  ? Procedure: INSERTION PORT-A-CATH (attached catheter in left subclavian);  Surgeon: Aviva Signs, MD;  Location: [facility];  Service: General;  Laterality: Left;  ? SPLENECTOMY Left Over 20 years ago  ? Due to Hodgkin's lymphoma  ? ? ?Family History  ?Problem Relation Age of Onset  ? Hypertension Mother   ? Cancer Father   ?     lung  ? Hypertension Father   ? Diabetes Father   ? COPD Father   ? Hypertension Brother   ? Stroke Brother   ? Cancer Paternal Uncle   ?     possibly throat cancer  ? Colon cancer Neg Hx   ? Esophageal cancer Neg Hx   ? Rectal cancer Neg Hx   ? Stomach cancer Neg Hx   ? ? ?Current Outpatient Medications on File Prior to Visit  ?Medication Sig Dispense Refill  ? ACCU-CHEK GUIDE test strip CHECK SUGAR UP TO 4 TIMES DAILY DX E11.9 400 strip 3  ? Accu-Chek Softclix Lancets lancets SMARTSIG:2 Topical Twice Daily PRN    ? amLODipine (NORVASC) 10 MG tablet Take 1 tablet (10 mg total) by mouth daily. 90 tablet 3  ? Ascorbic Acid (VITAMIN C) 1000 MG tablet Take 1,000 mg by mouth daily. Vitamin c lozenge    ? aspirin EC 81 MG tablet Take 81  mg by mouth daily.     ? blood glucose meter kit and supplies KIT Dispense based on patient and insurance preference. Use up to four times daily as directed. (FOR ICD-10 : E11.9 1 each 0  ? lidocaine-prilocaine (EMLA) cream Apply pea-sized amount to port a cath site and cover with plastic wrap one hour prior to each chemotherapy appointment 30 g 3  ? metFORMIN (GLUCOPHAGE-XR) 750 MG 24 hr tablet TAKE 1 TABLET BY MOUTH EVERY DAY WITH BREAKFAST 90 tablet 3  ? Multiple Vitamins-Minerals (ONE-A-DAY MENS HEALTH FORMULA PO) Take 1 tablet by mouth daily.     ? rosuvastatin (CRESTOR) 10 MG tablet TAKE 1 TABLET BY MOUTH DAILY. FOR CHOLESTEROL 90 tablet 3  ? VITAMIN D PO Take by mouth once.    ? ?No current facility-administered medications on file prior to visit.  ? ? ?Allergies  ?Allergen Reactions  ? Bleomycin Other (See Comments)  ?  Cramping all over  ? ? ?Social History  ? ?Substance and Sexual Activity  ?Alcohol Use No  ? ? ?Social History  ? ?Tobacco Use  ?Smoking Status Never  ?Smokeless Tobacco Never  ? ? ?Review of Systems  ?Constitutional: Negative.   ?  HENT: Negative.    ?Eyes: Negative.   ?Respiratory: Negative.    ?Cardiovascular: Negative.   ?Gastrointestinal: Negative.   ?Genitourinary: Negative.   ?Musculoskeletal: Negative.   ?Skin: Negative.   ?Neurological: Negative.   ?Endo/Heme/Allergies: Negative.   ?Psychiatric/Behavioral: Negative.    ? ?Objective  ? ?Vitals:  ? 06/18/21 1319  ?BP: 114/77  ?Pulse: 70  ?Resp: 12  ?Temp: 98.4 ?F (36.9 ?C)  ?SpO2: 96%  ? ? ?Physical Exam ?Vitals reviewed.  ?Constitutional:   ?   Appearance: Normal appearance. He is not ill-appearing.  ?HENT:  ?   Head: Normocephalic and atraumatic.  ?Cardiovascular:  ?   Rate and Rhythm: Normal rate and regular rhythm.  ?   Heart sounds: Normal heart sounds. No murmur heard. ?  No friction rub. No gallop.  ?Pulmonary:  ?   Effort: Pulmonary effort is normal. No respiratory distress.  ?   Breath sounds: Normal breath sounds. No stridor.  No wheezing, rhonchi or rales.  ?   Comments: Port-A-Cath in place left upper chest. ?Skin: ?   General: Skin is warm and dry.  ?Neurological:  ?   Mental Status: He is alert and oriented to person, place, and time.  ? ?Oncology notes reviewed ?Assessment  ?Hodgkin's lymphoma, finished with chemotherapy ?Plan  ?Patient is scheduled for Port-A-Cath removal in the minor procedure room on 06/24/2021.  The risks and benefits of the procedure were fully explained to the patient, who gave informed consent. ?

## 2021-06-18 NOTE — H&P (Signed)
Devin Tran; [id number]; 03/31/1965 ? ? ?HPI ?Patient is a 56-year-old black male who was referred back to my care by Dr. Katragadda of oncology for Port-A-Cath removal.  He has finished with his chemotherapy for Hodgkin's lymphoma. ?Past Medical History:  ?Diagnosis Date  ? Complication of anesthesia   ? PONV per anesth notes  ? Erectile dysfunction   ? Hodgkin's lymphoma ([facility])   ? Hyperlipidemia   ? Hypertension   ? Infertility   ? Low testosterone   ? PONV (postoperative nausea and vomiting)   ? ? ?Past Surgical History:  ?Procedure Laterality Date  ? AXILLARY LYMPH NODE BIOPSY Right 05/10/2018  ? Procedure: AXILLARY LYMPH NODE BIOPSY, RIGHT;  Surgeon: Geroldine Esquivias, MD;  Location: [facility];  Service: General;  Laterality: Right;  ? IR LYMPHANGIOGRAM EXTREMITY UNI LEFT  03/02/1987  ? PORTA CATH INSERTION    ? PORTA CATH REMOVAL    ? PORTACATH PLACEMENT Left 05/21/2018  ? Procedure: INSERTION PORT-A-CATH (attached catheter in left subclavian);  Surgeon: Makana Rostad, MD;  Location: [facility];  Service: General;  Laterality: Left;  ? SPLENECTOMY Left Over 20 years ago  ? Due to Hodgkin's lymphoma  ? ? ?Family History  ?Problem Relation Age of Onset  ? Hypertension Mother   ? Cancer Father   ?     lung  ? Hypertension Father   ? Diabetes Father   ? COPD Father   ? Hypertension Brother   ? Stroke Brother   ? Cancer Paternal Uncle   ?     possibly throat cancer  ? Colon cancer Neg Hx   ? Esophageal cancer Neg Hx   ? Rectal cancer Neg Hx   ? Stomach cancer Neg Hx   ? ? ?Current Outpatient Medications on File Prior to Visit  ?Medication Sig Dispense Refill  ? ACCU-CHEK GUIDE test strip CHECK SUGAR UP TO 4 TIMES DAILY DX E11.9 400 strip 3  ? Accu-Chek Softclix Lancets lancets SMARTSIG:2 Topical Twice Daily PRN    ? amLODipine (NORVASC) 10 MG tablet Take 1 tablet (10 mg total) by mouth daily. 90 tablet 3  ? Ascorbic Acid (VITAMIN C) 1000 MG tablet Take 1,000 mg by mouth daily. Vitamin c lozenge    ? aspirin EC 81 MG tablet Take 81  mg by mouth daily.     ? blood glucose meter kit and supplies KIT Dispense based on patient and insurance preference. Use up to four times daily as directed. (FOR ICD-10 : E11.9 1 each 0  ? lidocaine-prilocaine (EMLA) cream Apply pea-sized amount to port a cath site and cover with plastic wrap one hour prior to each chemotherapy appointment 30 g 3  ? metFORMIN (GLUCOPHAGE-XR) 750 MG 24 hr tablet TAKE 1 TABLET BY MOUTH EVERY DAY WITH BREAKFAST 90 tablet 3  ? Multiple Vitamins-Minerals (ONE-A-DAY MENS HEALTH FORMULA PO) Take 1 tablet by mouth daily.     ? rosuvastatin (CRESTOR) 10 MG tablet TAKE 1 TABLET BY MOUTH DAILY. FOR CHOLESTEROL 90 tablet 3  ? VITAMIN D PO Take by mouth once.    ? ?No current facility-administered medications on file prior to visit.  ? ? ?Allergies  ?Allergen Reactions  ? Bleomycin Other (See Comments)  ?  Cramping all over  ? ? ?Social History  ? ?Substance and Sexual Activity  ?Alcohol Use No  ? ? ?Social History  ? ?Tobacco Use  ?Smoking Status Never  ?Smokeless Tobacco Never  ? ? ?Review of Systems  ?Constitutional: Negative.   ?  HENT: Negative.    ?Eyes: Negative.   ?Respiratory: Negative.    ?Cardiovascular: Negative.   ?Gastrointestinal: Negative.   ?Genitourinary: Negative.   ?Musculoskeletal: Negative.   ?Skin: Negative.   ?Neurological: Negative.   ?Endo/Heme/Allergies: Negative.   ?Psychiatric/Behavioral: Negative.    ? ?Objective  ? ?Vitals:  ? 06/18/21 1319  ?BP: 114/77  ?Pulse: 70  ?Resp: 12  ?Temp: 98.4 ?F (36.9 ?C)  ?SpO2: 96%  ? ? ?Physical Exam ?Vitals reviewed.  ?Constitutional:   ?   Appearance: Normal appearance. He is not ill-appearing.  ?HENT:  ?   Head: Normocephalic and atraumatic.  ?Cardiovascular:  ?   Rate and Rhythm: Normal rate and regular rhythm.  ?   Heart sounds: Normal heart sounds. No murmur heard. ?  No friction rub. No gallop.  ?Pulmonary:  ?   Effort: Pulmonary effort is normal. No respiratory distress.  ?   Breath sounds: Normal breath sounds. No stridor.  No wheezing, rhonchi or rales.  ?   Comments: Port-A-Cath in place left upper chest. ?Skin: ?   General: Skin is warm and dry.  ?Neurological:  ?   Mental Status: He is alert and oriented to person, place, and time.  ? ?Oncology notes reviewed ?Assessment  ?Hodgkin's lymphoma, finished with chemotherapy ?Plan  ?Patient is scheduled for Port-A-Cath removal in the minor procedure room on 06/24/2021.  The risks and benefits of the procedure were fully explained to the patient, who gave informed consent. ?

## 2021-06-24 ENCOUNTER — Ambulatory Visit (HOSPITAL_COMMUNITY)
Admission: RE | Admit: 2021-06-24 | Discharge: 2021-06-24 | Disposition: A | Discharge status: Home / Self Care | Payer: 59 | Attending: General Surgery | Admitting: General Surgery

## 2021-06-24 ENCOUNTER — Encounter (HOSPITAL_COMMUNITY)
Admission: RE | Disposition: A | Discharge status: Home / Self Care | Payer: Self-pay | Source: Home / Self Care | Attending: General Surgery

## 2021-06-24 DIAGNOSIS — C8190 Hodgkin lymphoma, unspecified, unspecified site: Secondary | ICD-10-CM | POA: Diagnosis present

## 2021-06-24 DIAGNOSIS — Z9221 Personal history of antineoplastic chemotherapy: Secondary | ICD-10-CM | POA: Insufficient documentation

## 2021-06-24 HISTORY — PX: PORT-A-CATH REMOVAL: SHX5289

## 2021-06-24 SURGERY — MINOR REMOVAL PORT-A-CATH
Anesthesia: LOCAL | Laterality: Left

## 2021-06-24 MED ORDER — LIDOCAINE HCL (PF) 1 % IJ SOLN
INTRAMUSCULAR | Status: DC | PRN
  Administered 2021-06-24: 5 mL

## 2021-06-24 MED ORDER — LIDOCAINE HCL (PF) 1 % IJ SOLN
INTRAMUSCULAR | Status: AC
  Filled 2021-06-24: qty 30

## 2021-06-24 SURGICAL SUPPLY — 23 items
ADH SKN CLS APL DERMABOND .7 (GAUZE/BANDAGES/DRESSINGS) ×1
APL PRP STRL LF ISPRP CHG 10.5 (MISCELLANEOUS) ×1
APPLICATOR CHLORAPREP 10.5 ORG (MISCELLANEOUS) ×2 IMPLANT
CLOTH BEACON ORANGE TIMEOUT ST (SAFETY) ×2 IMPLANT
DECANTER SPIKE VIAL GLASS SM (MISCELLANEOUS) ×2 IMPLANT
DERMABOND ADVANCED (GAUZE/BANDAGES/DRESSINGS) ×1
DERMABOND ADVANCED .7 DNX12 (GAUZE/BANDAGES/DRESSINGS) ×1 IMPLANT
DRAPE HALF SHEET 40X57 (DRAPES) IMPLANT
ELECT REM PT RETURN 9FT ADLT (ELECTROSURGICAL) ×2
ELECTRODE REM PT RTRN 9FT ADLT (ELECTROSURGICAL) ×1 IMPLANT
GLOVE BIOGEL PI IND STRL 7.0 (GLOVE) ×2 IMPLANT
GLOVE BIOGEL PI INDICATOR 7.0 (GLOVE) ×2
GLOVE SURG SS PI 7.5 STRL IVOR (GLOVE) ×2 IMPLANT
GOWN STRL REUS W/TWL LRG LVL3 (GOWN DISPOSABLE) IMPLANT
NDL HYPO 25X1 1.5 SAFETY (NEEDLE) ×1 IMPLANT
NEEDLE HYPO 25X1 1.5 SAFETY (NEEDLE) ×2 IMPLANT
PENCIL SMOKE EVACUATOR COATED (MISCELLANEOUS) IMPLANT
SPONGE GAUZE 2X2 8PLY STRL LF (GAUZE/BANDAGES/DRESSINGS) ×2 IMPLANT
SUT MNCRL AB 4-0 PS2 18 (SUTURE) ×2 IMPLANT
SUT VIC AB 3-0 SH 27 (SUTURE) ×2
SUT VIC AB 3-0 SH 27X BRD (SUTURE) ×1 IMPLANT
SYR CONTROL 10ML LL (SYRINGE) ×2 IMPLANT
TOWEL OR 17X26 4PK STRL BLUE (TOWEL DISPOSABLE) ×2 IMPLANT

## 2021-06-24 NOTE — Op Note (Signed)
Patient:  Devin Tran  DOB:  05/27/1965  MRN:  [id number]   Preop Diagnosis: Hodgkin's lymphoma, finished with chemotherapy  Postop Diagnosis: Same  Procedure: Port-A-Cath removal  Surgeon: Aviva Signs, MD  Anes: Local  Indications: Patient is a 56 year old black male who now presents for Port-A-Cath removal.  He has finished his chemotherapy.  The risks and benefits of the procedure were fully explained to the patient, who gave informed consent.  Procedure note: The patient was placed in the supine position in the minor procedure room.  The left upper chest was prepped and draped using the usual sterile technique with ChloraPrep.  Surgical site confirmation was performed.  1% Xylocaine was used for local anesthesia.  An incision was made in the left upper chest through the previous surgical incision site.  The dissection was taken down to the Port-A-Cath.  The Port-A-Cath was removed in total without difficulty.  It was disposed of.  The subcutaneous layer was reapproximated using a 3-0 Vicryl interrupted suture.  The skin was closed using a 4-0 Monocryl subcuticular suture.  Dermabond was applied.  All tape and needle counts were correct at the end of the procedure.  The patient was discharged from the minor procedure room in good and stable condition.  Complications: None  EBL: Minimal  Specimen: None

## 2021-06-24 NOTE — Interval H&P Note (Signed)
History and Physical Interval Note:  06/24/2021 8:19 AM  Devin Tran  has presented today for surgery, with the diagnosis of PORT- A- CATH REMOVAL, MINOR Hodgkin lymphoma, unspecified Hodgkin lymphoma type, unspecified body region.  The various methods of treatment have been discussed with the patient and family. After consideration of risks, benefits and other options for treatment, the patient has consented to  Procedure(s): MINOR REMOVAL PORT-A-CATH (Left) as a surgical intervention.  The patient's history has been reviewed, patient examined, no change in status, stable for surgery.  I have reviewed the patient's chart and labs.  Questions were answered to the patient's satisfaction.     Aviva Signs

## 2021-06-25 ENCOUNTER — Encounter (HOSPITAL_COMMUNITY): Payer: Self-pay | Admitting: General Surgery

## 2021-06-25 ENCOUNTER — Ambulatory Visit (INDEPENDENT_AMBULATORY_CARE_PROVIDER_SITE_OTHER): Payer: 59 | Admitting: Family Medicine

## 2021-06-25 VITALS — BP 131/78 | HR 74 | Temp 97.4°F | Ht 73.0 in | Wt 233.0 lb

## 2021-06-25 DIAGNOSIS — E119 Type 2 diabetes mellitus without complications: Secondary | ICD-10-CM

## 2021-06-25 DIAGNOSIS — I10 Essential (primary) hypertension: Secondary | ICD-10-CM

## 2021-06-25 DIAGNOSIS — E782 Mixed hyperlipidemia: Secondary | ICD-10-CM

## 2021-06-25 NOTE — Progress Notes (Signed)
Subjective:  Patient ID: Devin Tran,  male    DOB: 09/01/1965  Age: 56 y.o.    CC: Medical Management of Chronic Issues   HPI Devin Tran presents for  follow-up of hypertension. Patient has no history of headache chest pain or shortness of breath or recent cough. Patient also denies symptoms of TIA such as numbness weakness lateralizing. Patient denies side effects from medication. States taking it regularly.  Patient also  in for follow-up of elevated cholesterol. Doing well without complaints on current medication. Denies side effects  including myalgia and arthralgia and nausea. Also in today for liver function testing. Currently no chest pain, shortness of breath or other cardiovascular related symptoms noted.  Follow-up of diabetes. Patient does check blood sugar at home. Readings run between 150-170 fasting and 100- 120 Patient denies symptoms such as excessive hunger or urinary frequency, excessive hunger, nausea No significant hypoglycemic spells noted. Medications reviewed. Pt reports taking them regularly. Pt. denies complication/adverse reaction today.    History Devin Tran has a past medical history of Complication of anesthesia, Erectile dysfunction, Hodgkin's lymphoma ([place]), Hyperlipidemia, Hypertension, Infertility, Low testosterone, and PONV (postoperative nausea and vomiting).   He has a past surgical history that includes Splenectomy (Left, Over 20 years ago); PORTA CATH INSERTION; PORTA CATH REMOVAL; Axillary lymph node biopsy (Right, 05/10/2018); Portacath placement (Left, 05/21/2018); IR LYMPHANGIOGRAM EXTREMITY UNI LEFT (03/02/1987); and Port-a-cath removal (Left, 06/24/2021).   His family history includes COPD in his father; Cancer in his father and paternal uncle; Diabetes in his father; Hypertension in his brother, father, and mother; Stroke in his brother.He reports that he has never smoked. He has never used smokeless tobacco. He reports that he does not drink alcohol  and does not use drugs.  Current Facility-Administered Medications on File Prior to Visit  Medication Dose Route Frequency Provider Last Rate Last Admin   lidocaine (PF) (XYLOCAINE) 1 % injection    PRN Aviva Signs, MD   5 mL at 06/24/21 4010   Current Outpatient Medications on File Prior to Visit  Medication Sig Dispense Refill   ACCU-CHEK GUIDE test strip CHECK SUGAR UP TO 4 TIMES DAILY DX E11.9 400 strip 3   Accu-Chek Softclix Lancets lancets SMARTSIG:2 Topical Twice Daily PRN     amLODipine (NORVASC) 10 MG tablet Take 1 tablet (10 mg total) by mouth daily. 90 tablet 3   Ascorbic Acid (VITAMIN C) 1000 MG tablet Take 1,000 mg by mouth daily. Vitamin c lozenge     blood glucose meter kit and supplies KIT Dispense based on patient and insurance preference. Use up to four times daily as directed. (FOR ICD-10 : E11.9 1 each 0   metFORMIN (GLUCOPHAGE-XR) 750 MG 24 hr tablet TAKE 1 TABLET BY MOUTH EVERY DAY WITH BREAKFAST 90 tablet 3   Multiple Vitamins-Minerals (ONE-A-DAY MENS HEALTH FORMULA PO) Take 1 tablet by mouth daily.      rosuvastatin (CRESTOR) 10 MG tablet TAKE 1 TABLET BY MOUTH DAILY. FOR CHOLESTEROL 90 tablet 3    ROS Review of Systems  Constitutional:  Negative for fever.  Respiratory:  Negative for shortness of breath.   Cardiovascular:  Negative for chest pain.  Musculoskeletal:  Negative for arthralgias.  Skin:  Negative for rash.   Objective:  BP 131/78   Pulse 74   Temp (!) 97.4 F (36.3 C)   Ht _0  (1.854 m)   Wt 233 lb (105.7 kg)   SpO2 96%   BMI 30.74 kg/m  BP Readings from Last 3 Encounters:  06/24/21 132/88  06/25/21 131/78  06/18/21 114/77    Wt Readings from Last 3 Encounters:  06/25/21 233 lb (105.7 kg)  06/18/21 231 lb (104.8 kg)  05/30/21 231 lb 14.4 oz (105.2 kg)     Physical Exam Vitals reviewed.  Constitutional:      Appearance: He is well-developed.  HENT:     Head: Normocephalic and atraumatic.     Right Ear: External ear  normal.     Left Ear: External ear normal.     Mouth/Throat:     Pharynx: No oropharyngeal exudate or posterior oropharyngeal erythema.  Eyes:     Pupils: Pupils are equal, round, and reactive to light.  Cardiovascular:     Rate and Rhythm: Normal rate and regular rhythm.     Heart sounds: No murmur heard. Pulmonary:     Effort: No respiratory distress.     Breath sounds: Normal breath sounds.  Musculoskeletal:     Cervical back: Normal range of motion and neck supple.  Neurological:     Mental Status: He is alert and oriented to person, place, and time.    Diabetic Foot Exam - Simple   No data filed     Lab Results  Component Value Date   HGBA1C 7.0 (H) 12/26/2020   HGBA1C 6.8 09/25/2020   HGBA1C 7.1 (H) 06/25/2020    Assessment & Plan:   Devin Tran was seen today for medical management of chronic issues.  Diagnoses and all orders for this visit:  Mixed hyperlipidemia -     Lipid panel  Type 2 diabetes mellitus without complication, without long-term current use of insulin (HCC) -     Bayer DCA Hb A1c Waived  Essential hypertension -     CBC with Differential/Platelet -     CMP14+EGFR   I am having Devin Tran maintain his Multiple Vitamins-Minerals (ONE-A-DAY MENS HEALTH FORMULA PO), vitamin C, blood glucose meter kit and supplies, Accu-Chek Softclix Lancets, amLODipine, metFORMIN, rosuvastatin, and Accu-Chek Guide.  No orders of the defined types were placed in this encounter.    Follow-up: Return in about 3 months (around 09/25/2021).  [name], M.D.

## 2021-06-26 ENCOUNTER — Encounter (HOSPITAL_COMMUNITY): Payer: Self-pay | Admitting: Hematology

## 2021-06-26 LAB — CMP14+EGFR
ALT: 25 IU/L (ref 0–44)
AST: 17 IU/L (ref 0–40)
Albumin/Globulin Ratio: 2.3 — ABNORMAL HIGH (ref 1.2–2.2)
Albumin: 4.8 g/dL (ref 3.8–4.9)
Alkaline Phosphatase: 88 IU/L (ref 44–121)
BUN/Creatinine Ratio: 13 (ref 9–20)
BUN: 14 mg/dL (ref 6–24)
Bilirubin Total: 0.3 mg/dL (ref 0.0–1.2)
CO2: 24 mmol/L (ref 20–29)
Calcium: 9.9 mg/dL (ref 8.7–10.2)
Chloride: 106 mmol/L (ref 96–106)
Creatinine, Ser: 1.11 mg/dL (ref 0.76–1.27)
Globulin, Total: 2.1 g/dL (ref 1.5–4.5)
Glucose: 112 mg/dL — ABNORMAL HIGH (ref 70–99)
Potassium: 3.9 mmol/L (ref 3.5–5.2)
Sodium: 144 mmol/L (ref 134–144)
Total Protein: 6.9 g/dL (ref 6.0–8.5)
eGFR: 79 mL/min/{1.73_m2} (ref >59)

## 2021-06-26 LAB — CBC WITH DIFFERENTIAL/PLATELET
Basophils Absolute: 0 10*3/uL (ref 0.0–0.2)
Basos: 1 %
EOS (ABSOLUTE): 0.1 10*3/uL (ref 0.0–0.4)
Eos: 3 %
Hematocrit: 44.3 % (ref 37.5–51.0)
Hemoglobin: 14.3 g/dL (ref 13.0–17.7)
Immature Grans (Abs): 0 10*3/uL (ref 0.0–0.1)
Immature Granulocytes: 0 %
Lymphocytes Absolute: 1.5 10*3/uL (ref 0.7–3.1)
Lymphs: 39 %
MCH: 27.8 pg (ref 26.6–33.0)
MCHC: 32.3 g/dL (ref 31.5–35.7)
MCV: 86 fL (ref 79–97)
Monocytes Absolute: 0.6 10*3/uL (ref 0.1–0.9)
Monocytes: 17 %
Neutrophils Absolute: 1.6 10*3/uL (ref 1.4–7.0)
Neutrophils: 40 %
Platelets: 227 10*3/uL (ref 150–450)
RBC: 5.14 x10E6/uL (ref 4.14–5.80)
RDW: 14.2 % (ref 11.6–15.4)
WBC: 3.8 10*3/uL (ref 3.4–10.8)

## 2021-06-26 LAB — LIPID PANEL
Chol/HDL Ratio: 2.4 ratio (ref 0.0–5.0)
Cholesterol, Total: 109 mg/dL (ref 100–199)
HDL: 45 mg/dL (ref >39)
LDL Chol Calc (NIH): 48 mg/dL (ref 0–99)
Triglycerides: 81 mg/dL (ref 0–149)
VLDL Cholesterol Cal: 16 mg/dL (ref 5–40)

## 2021-06-26 LAB — BAYER DCA HB A1C WAIVED: HB A1C (BAYER DCA - WAIVED): 7.3 % — ABNORMAL HIGH (ref 4.8–5.6)

## 2021-06-26 NOTE — Progress Notes (Signed)
Hello Devin Tran,  Your lab result is normal and/or stable.Some minor variations that are not significant are commonly marked abnormal, but do not represent any medical problem for you.  Best regards, Viana Sleep, M.D.

## 2021-06-27 NOTE — Progress Notes (Signed)
Hello Ridling,  Your lab result is normal and/or stable.Some minor variations that are not significant are commonly marked abnormal, but do not represent any medical problem for you.  Best regards, Naja Apperson, M.D.

## 2021-07-13 ENCOUNTER — Other Ambulatory Visit: Payer: Self-pay | Admitting: Nurse Practitioner

## 2021-08-23 ENCOUNTER — Emergency Department (HOSPITAL_COMMUNITY)
Admission: EM | Admit: 2021-08-23 | Discharge: 2021-08-23 | Disposition: A | Discharge status: Home / Self Care | Payer: 59 | Attending: Emergency Medicine | Admitting: Emergency Medicine

## 2021-08-23 ENCOUNTER — Encounter (HOSPITAL_COMMUNITY): Payer: Self-pay | Admitting: Emergency Medicine

## 2021-08-23 DIAGNOSIS — Z8571 Personal history of Hodgkin lymphoma: Secondary | ICD-10-CM | POA: Diagnosis not present

## 2021-08-23 DIAGNOSIS — Z5189 Encounter for other specified aftercare: Secondary | ICD-10-CM

## 2021-08-23 DIAGNOSIS — E119 Type 2 diabetes mellitus without complications: Secondary | ICD-10-CM | POA: Insufficient documentation

## 2021-08-23 DIAGNOSIS — Z4801 Encounter for change or removal of surgical wound dressing: Secondary | ICD-10-CM | POA: Insufficient documentation

## 2021-08-23 DIAGNOSIS — Z7984 Long term (current) use of oral hypoglycemic drugs: Secondary | ICD-10-CM | POA: Insufficient documentation

## 2021-08-23 NOTE — Discharge Instructions (Signed)
Contact a health care provider if: Your cyst or abscess returns. You have more redness, swelling, or pain around your incision. You have more fluid or blood coming from your incision. Your incision feels warm to the touch. You have pus or a bad smell coming from your incision. You have red streaks above or below the incision site. Get help right away if: You have severe pain or bleeding. You cannot eat or drink without vomiting. You have a fever or chills. You have redness that spreads quickly. You have decreased urine output. You become short of breath. You have chest pain. You cough up blood. The affected area becomes numb or starts to tingle.

## 2021-08-23 NOTE — ED Provider Notes (Signed)
[facility] EMERGENCY DEPARTMENT Provider Note   CSN: [id number] Arrival date & time: 08/23/21  1304     History  Chief Complaint  Patient presents with   Wound Check    Devin Tran is a 56 y.o. male with a past medical history of diabetes, Hodgkin's lymphoma who presents emergency department with a chief complaint of abscess.  Patient states that he has had tender area under his pannus for the past several days.  He was seen at outpatient urgent care started on doxycycline which she is taking currently.  Today while he was at work it "burst open."  He had copious amounts of purulent discharge.  Patient states that he was concerned it might be a spider bite and that he might need some blood work.   Wound Check       Home Medications Prior to Admission medications   Medication Sig Start Date End Date Taking? Authorizing Provider  ACCU-CHEK GUIDE test strip CHECK SUGAR UP TO 4 TIMES DAILY DX E11.9 04/15/21   [name], MD  Accu-Chek Softclix Lancets lancets SMARTSIG:2 Topical Twice Daily PRN 03/26/20   [provider]  amLODipine (NORVASC) 10 MG tablet Take 1 tablet (10 mg total) by mouth daily. 09/25/20   [name], MD  Ascorbic Acid (VITAMIN C) 1000 MG tablet Take 1,000 mg by mouth daily. Vitamin c lozenge    [provider]  blood glucose meter kit and supplies KIT Dispense based on patient and insurance preference. Use up to four times daily as directed. (FOR ICD-10 : E11.9 03/26/20   [name], MD  metFORMIN (GLUCOPHAGE-XR) 750 MG 24 hr tablet TAKE 1 TABLET BY MOUTH EVERY DAY WITH BREAKFAST 09/25/20   [name], MD  Multiple Vitamins-Minerals (ONE-A-DAY MENS HEALTH FORMULA PO) Take 1 tablet by mouth daily.     [provider]  rosuvastatin (CRESTOR) 10 MG tablet TAKE 1 TABLET BY MOUTH DAILY. FOR CHOLESTEROL 09/25/20   [name], MD      Allergies    Bleomycin    Review of Systems   Review of Systems  Skin:  Positive for  wound.    Physical Exam Updated Vital Signs BP (!) 133/97 (BP Location: Right Arm)   Pulse 69   Temp 98 F (36.7 C) (Oral)   Resp 18   SpO2 98%  Physical Exam Vitals and nursing note reviewed.  Constitutional:      General: He is not in acute distress.    Appearance: He is well-developed. He is not diaphoretic.  HENT:     Head: Normocephalic and atraumatic.  Eyes:     General: No scleral icterus.    Conjunctiva/sclera: Conjunctivae normal.  Cardiovascular:     Rate and Rhythm: Normal rate and regular rhythm.     Heart sounds: Normal heart sounds.  Pulmonary:     Effort: Pulmonary effort is normal. No respiratory distress.     Breath sounds: Normal breath sounds.  Abdominal:     Palpations: Abdomen is soft.     Tenderness: There is no abdominal tenderness.  Musculoskeletal:     Cervical back: Normal range of motion and neck supple.  Skin:    General: Skin is warm and dry.     Comments: 2 cm area of induration.  With palpation there is some serosanguineous and mild purulent drainage.  About 2 cm of surrounding erythema without induration.  Neurological:     Mental Status: He is alert.  Psychiatric:  Behavior: Behavior normal.     ED Results / Procedures / Treatments   Labs (all labs ordered are listed, but only abnormal results are displayed) Labs Reviewed - No data to display  EKG None  Radiology No results found.  Procedures Procedures    Medications Ordered in ED Medications - No data to display  ED Course/ Medical Decision Making/ A&P                           Medical Decision Making Is a 56 year old male with a past medical history of Hodgkin lymphoma and diabetes.  There is a small area erythema.  This appears to be draining abscess likely an infected hair follicle.  I do not see any evidence of spider bite such as fang marks.  Patient is afebrile and hemodynamically stable and I do not think he needs any further blood work.  I discussed with  the patient that treatment for an abscess is to open and drain it which it is already done and he is on the appropriate antibiotics.  I discussed that I do not think he needs any further intervention today and that he should continue taking antibiotics.  I discussed reasons to seek immediate medical care including worsening pain, spreading of the erythema, fever.  Patient appears otherwise appropriate for discharge at this time           Final Clinical Impression(s) / ED Diagnoses Final diagnoses:  Wound check, abscess    Rx / DC Orders ED Discharge Orders     None         Margarita Mail, PA-C 83/87/06 5826    Campbell Stall P, DO 08/88/35 1438

## 2021-08-23 NOTE — ED Triage Notes (Signed)
Pt had spider bite on lower abd last week. Seen at urgent care and is on abx.  Pt worried because he is having redness and draining to the area.

## 2021-09-30 ENCOUNTER — Encounter: Payer: Self-pay | Admitting: Family Medicine

## 2021-09-30 ENCOUNTER — Ambulatory Visit (INDEPENDENT_AMBULATORY_CARE_PROVIDER_SITE_OTHER): Payer: 59 | Admitting: Family Medicine

## 2021-09-30 VITALS — BP 119/80 | HR 74 | Temp 98.2°F | Ht 73.0 in | Wt 230.0 lb

## 2021-09-30 DIAGNOSIS — E782 Mixed hyperlipidemia: Secondary | ICD-10-CM

## 2021-09-30 DIAGNOSIS — I10 Essential (primary) hypertension: Secondary | ICD-10-CM

## 2021-09-30 DIAGNOSIS — Z23 Encounter for immunization: Secondary | ICD-10-CM | POA: Diagnosis not present

## 2021-09-30 DIAGNOSIS — Z125 Encounter for screening for malignant neoplasm of prostate: Secondary | ICD-10-CM

## 2021-09-30 DIAGNOSIS — Z0001 Encounter for general adult medical examination with abnormal findings: Secondary | ICD-10-CM | POA: Diagnosis not present

## 2021-09-30 DIAGNOSIS — E119 Type 2 diabetes mellitus without complications: Secondary | ICD-10-CM

## 2021-09-30 DIAGNOSIS — Z0000 Encounter for general adult medical examination without abnormal findings: Secondary | ICD-10-CM

## 2021-09-30 LAB — URINALYSIS
Bilirubin, UA: NEGATIVE
Ketones, UA: NEGATIVE
Leukocytes,UA: NEGATIVE
Nitrite, UA: NEGATIVE
Protein,UA: NEGATIVE
Specific Gravity, UA: 1.025 (ref 1.005–1.030)
Urobilinogen, Ur: 0.2 mg/dL (ref 0.2–1.0)
pH, UA: 5.5 (ref 5.0–7.5)

## 2021-09-30 LAB — BAYER DCA HB A1C WAIVED: HB A1C (BAYER DCA - WAIVED): 7.6 % — ABNORMAL HIGH (ref 4.8–5.6)

## 2021-09-30 MED ORDER — METFORMIN HCL ER 750 MG PO TB24: 750.0000 mg | ORAL_TABLET | Freq: Two times a day (BID) | ORAL | 3 refills | Status: DC

## 2021-09-30 MED ORDER — ROSUVASTATIN CALCIUM 10 MG PO TABS: ORAL_TABLET | ORAL | 3 refills | Status: DC

## 2021-09-30 MED ORDER — AMLODIPINE BESYLATE 10 MG PO TABS: 10.0000 mg | ORAL_TABLET | Freq: Every day | ORAL | 3 refills | Status: DC

## 2021-09-30 NOTE — Progress Notes (Signed)
Subjective:  Patient ID: Devin Tran, male    DOB: December 05, 1965  Age: 56 y.o. MRN: [id number]  CC: Medical Management of Chronic Issues   HPI Devin Tran presents for Annual physical  presents forFollow-up of diabetes. Patient checks blood sugar at home.   A little higher fasting , 140-160 and about the same postprandial115-120 Patient denies symptoms such as polyuria, polydipsia, excessive hunger, nausea No significant hypoglycemic spells noted. Medications reviewed. Pt reports taking them regularly without complication/adverse reaction being reported today.  Lab Results  Component Value Date   HGBA1C 7.6 (H) 09/30/2021   HGBA1C 7.3 (H) 06/25/2021   HGBA1C 7.0 (H) 12/26/2020   +     09/30/2021    3:19 PM 06/25/2021    4:15 PM 12/26/2020    2:50 PM  Depression screen PHQ 2/9  Decreased Interest 0 0 0  Down, Depressed, Hopeless 0 0 0  PHQ - 2 Score 0 0 0  Altered sleeping   0  Tired, decreased energy   2  Change in appetite   0  Feeling bad or failure about yourself    0  Trouble concentrating   2  Moving slowly or fidgety/restless   0  Suicidal thoughts   0  PHQ-9 Score   4  Difficult doing work/chores   Somewhat difficult    History Breven has a past medical history of Complication of anesthesia, Erectile dysfunction, Hodgkin's lymphoma ([place]), Hyperlipidemia, Hypertension, Infertility, Low testosterone, and PONV (postoperative nausea and vomiting).   He has a past surgical history that includes Splenectomy (Left, Over 20 years ago); PORTA CATH INSERTION; PORTA CATH REMOVAL; Axillary lymph node biopsy (Right, 05/10/2018); Portacath placement (Left, 05/21/2018); IR LYMPHANGIOGRAM EXTREMITY UNI LEFT (03/02/1987); and Port-a-cath removal (Left, 06/24/2021).   His family history includes COPD in his father; Cancer in his father and paternal uncle; Diabetes in his father; Hypertension in his brother, father, and mother; Stroke in his brother.He reports that he has never smoked.  He has never used smokeless tobacco. He reports that he does not drink alcohol and does not use drugs.    ROS Review of Systems  Constitutional:  Negative for activity change, fatigue and unexpected weight change.  HENT:  Negative for congestion, ear pain, hearing loss, postnasal drip and trouble swallowing.   Eyes:  Negative for pain and visual disturbance.  Respiratory:  Negative for cough, chest tightness and shortness of breath.   Cardiovascular:  Negative for chest pain, palpitations and leg swelling.  Gastrointestinal:  Negative for abdominal distention, abdominal pain, blood in stool, constipation, diarrhea, nausea and vomiting.  Endocrine: Negative for cold intolerance, heat intolerance and polydipsia.  Genitourinary:  Negative for difficulty urinating, dysuria, flank pain, frequency and urgency.  Musculoskeletal:  Negative for arthralgias and joint swelling.  Skin:  Negative for color change, rash and wound.  Neurological:  Negative for dizziness, syncope, speech difficulty, weakness, light-headedness, numbness and headaches.  Hematological:  Does not bruise/bleed easily.  Psychiatric/Behavioral:  Negative for confusion, decreased concentration, dysphoric mood and sleep disturbance. The patient is not nervous/anxious.     Objective:  BP 119/80   Pulse 74   Temp 98.2 F (36.8 C)   Ht '6\' 1"'  (1.854 m)   Wt 230 lb (104.3 kg)   SpO2 97%   BMI 30.34 kg/m   BP Readings from Last 3 Encounters:  09/30/21 119/80  08/23/21 (!) 133/97  06/25/21 131/78    Wt Readings from Last 3 Encounters:  09/30/21 230 lb (  104.3 kg)  06/25/21 233 lb (105.7 kg)  06/18/21 231 lb (104.8 kg)     Physical Exam Constitutional:      Appearance: He is well-developed.  HENT:     Head: Normocephalic and atraumatic.  Eyes:     Pupils: Pupils are equal, round, and reactive to light.  Neck:     Thyroid: No thyromegaly.     Trachea: No tracheal deviation.  Cardiovascular:     Rate and Rhythm:  Normal rate and regular rhythm.     Heart sounds: Normal heart sounds. No murmur heard.    No friction rub. No gallop.  Pulmonary:     Breath sounds: Normal breath sounds. No wheezing or rales.  Abdominal:     General: Bowel sounds are normal. There is no distension.     Palpations: Abdomen is soft. There is no mass.     Tenderness: There is no abdominal tenderness.     Hernia: There is no hernia in the left inguinal area.  Genitourinary:    Penis: Normal.      Testes: Normal.  Musculoskeletal:        General: Normal range of motion.     Cervical back: Normal range of motion.  Lymphadenopathy:     Cervical: No cervical adenopathy.  Skin:    General: Skin is warm and dry.  Neurological:     Mental Status: He is alert and oriented to person, place, and time.       Assessment & Plan:   Devin Tran was seen today for medical management of chronic issues.  Diagnoses and all orders for this visit:  Well adult exam -     Bayer DCA Hb A1c Waived -     CBC with Differential/Platelet -     CMP14+EGFR -     Lipid panel -     Microalbumin / creatinine urine ratio -     Urinalysis  Mixed hyperlipidemia -     Lipid panel -     rosuvastatin (CRESTOR) 10 MG tablet; TAKE 1 TABLET BY MOUTH DAILY. FOR CHOLESTEROL  Type 2 diabetes mellitus without complication, without long-term current use of insulin (HCC) -     Bayer DCA Hb A1c Waived -     Microalbumin / creatinine urine ratio -     metFORMIN (GLUCOPHAGE-XR) 750 MG 24 hr tablet; Take 1 tablet (750 mg total) by mouth 2 (two) times daily. TAKE 1 TABLET BY MOUTH EVERY DAY WITH BREAKFAST  Essential hypertension -     CBC with Differential/Platelet -     CMP14+EGFR -     amLODipine (NORVASC) 10 MG tablet; Take 1 tablet (10 mg total) by mouth daily.  Screening for prostate cancer -     PSA, total and free  Need for shingles vaccine -     Zoster Recombinant (Shingrix )       I have discontinued Banyan L. Baldwin's blood glucose meter  kit and supplies. I have also changed his metFORMIN. Additionally, I am having him maintain his Multiple Vitamins-Minerals (ONE-A-DAY MENS HEALTH FORMULA PO), vitamin C, Accu-Chek Softclix Lancets, Accu-Chek Guide, amLODipine, and rosuvastatin.  Allergies as of 09/30/2021       Reactions   Bleomycin Other (See Comments)   Cramping all over        Medication List        Accurate as of September 30, 2021  5:38 PM. If you have any questions, ask your nurse or doctor.  STOP taking these medications    blood glucose meter kit and supplies Kit Stopped by: [name], MD       TAKE these medications    Accu-Chek Guide test strip Generic drug: glucose blood CHECK SUGAR UP TO 4 TIMES DAILY DX E11.9   Accu-Chek Softclix Lancets lancets SMARTSIG:2 Topical Twice Daily PRN   amLODipine 10 MG tablet Commonly known as: NORVASC Take 1 tablet (10 mg total) by mouth daily.   metFORMIN 750 MG 24 hr tablet Commonly known as: GLUCOPHAGE-XR Take 1 tablet (750 mg total) by mouth 2 (two) times daily. TAKE 1 TABLET BY MOUTH EVERY DAY WITH BREAKFAST What changed:  how much to take how to take this when to take this Changed by: [name], MD   ONE-A-DAY MENS HEALTH FORMULA PO Take 1 tablet by mouth daily.   rosuvastatin 10 MG tablet Commonly known as: CRESTOR TAKE 1 TABLET BY MOUTH DAILY. FOR CHOLESTEROL   vitamin C 1000 MG tablet Take 1,000 mg by mouth daily. Vitamin c lozenge         Follow-up: Return in about 3 months (around 12/31/2021).  [name], M.D.

## 2021-10-01 LAB — CMP14+EGFR
ALT: 28 IU/L (ref 0–44)
AST: 24 IU/L (ref 0–40)
Albumin/Globulin Ratio: 2.3 — ABNORMAL HIGH (ref 1.2–2.2)
Albumin: 4.9 g/dL (ref 3.8–4.9)
Alkaline Phosphatase: 98 IU/L (ref 44–121)
BUN/Creatinine Ratio: 13 (ref 9–20)
BUN: 12 mg/dL (ref 6–24)
Bilirubin Total: 0.3 mg/dL (ref 0.0–1.2)
CO2: 20 mmol/L (ref 20–29)
Calcium: 9.8 mg/dL (ref 8.7–10.2)
Chloride: 101 mmol/L (ref 96–106)
Creatinine, Ser: 0.93 mg/dL (ref 0.76–1.27)
Globulin, Total: 2.1 g/dL (ref 1.5–4.5)
Glucose: 176 mg/dL — ABNORMAL HIGH (ref 70–99)
Potassium: 4 mmol/L (ref 3.5–5.2)
Sodium: 144 mmol/L (ref 134–144)
Total Protein: 7 g/dL (ref 6.0–8.5)
eGFR: 98 mL/min/{1.73_m2} (ref >59)

## 2021-10-01 LAB — CBC WITH DIFFERENTIAL/PLATELET
Basophils Absolute: 0 10*3/uL (ref 0.0–0.2)
Basos: 1 %
EOS (ABSOLUTE): 0.1 10*3/uL (ref 0.0–0.4)
Eos: 2 %
Hematocrit: 48.3 % (ref 37.5–51.0)
Hemoglobin: 15.9 g/dL (ref 13.0–17.7)
Immature Grans (Abs): 0 10*3/uL (ref 0.0–0.1)
Immature Granulocytes: 0 %
Lymphocytes Absolute: 1.5 10*3/uL (ref 0.7–3.1)
Lymphs: 44 %
MCH: 27.8 pg (ref 26.6–33.0)
MCHC: 32.9 g/dL (ref 31.5–35.7)
MCV: 85 fL (ref 79–97)
Monocytes Absolute: 0.5 10*3/uL (ref 0.1–0.9)
Monocytes: 13 %
Neutrophils Absolute: 1.3 10*3/uL — ABNORMAL LOW (ref 1.4–7.0)
Neutrophils: 40 %
Platelets: 253 10*3/uL (ref 150–450)
RBC: 5.71 x10E6/uL (ref 4.14–5.80)
RDW: 14.2 % (ref 11.6–15.4)
WBC: 3.4 10*3/uL (ref 3.4–10.8)

## 2021-10-01 LAB — LIPID PANEL
Chol/HDL Ratio: 2.8 ratio (ref 0.0–5.0)
Cholesterol, Total: 133 mg/dL (ref 100–199)
HDL: 47 mg/dL (ref >39)
LDL Chol Calc (NIH): 65 mg/dL (ref 0–99)
Triglycerides: 116 mg/dL (ref 0–149)
VLDL Cholesterol Cal: 21 mg/dL (ref 5–40)

## 2021-10-01 LAB — MICROALBUMIN / CREATININE URINE RATIO
Creatinine, Urine: 142.4 mg/dL
Microalb/Creat Ratio: 13 mg/g creat (ref 0–29)
Microalbumin, Urine: 19.1 ug/mL

## 2021-10-01 NOTE — Progress Notes (Signed)
Hello Brodan,  Your lab result is normal and/or stable.Some minor variations that are not significant are commonly marked abnormal, but do not represent any medical problem for you.  Best regards, Alandra Sando, M.D.

## 2021-10-02 LAB — PSA: Prostate Specific Ag, Serum: 0.4 ng/mL (ref 0.0–4.0)

## 2021-10-02 LAB — SPECIMEN STATUS REPORT

## 2021-11-16 ENCOUNTER — Other Ambulatory Visit: Payer: Self-pay | Admitting: Family Medicine

## 2021-11-16 DIAGNOSIS — E119 Type 2 diabetes mellitus without complications: Secondary | ICD-10-CM

## 2022-01-01 ENCOUNTER — Encounter: Payer: Self-pay | Admitting: Family Medicine

## 2022-01-01 ENCOUNTER — Ambulatory Visit (INDEPENDENT_AMBULATORY_CARE_PROVIDER_SITE_OTHER): Payer: 59 | Admitting: Family Medicine

## 2022-01-01 VITALS — BP 120/74 | HR 79 | Temp 97.6°F | Ht 73.0 in | Wt 230.4 lb

## 2022-01-01 DIAGNOSIS — E782 Mixed hyperlipidemia: Secondary | ICD-10-CM | POA: Diagnosis not present

## 2022-01-01 DIAGNOSIS — E119 Type 2 diabetes mellitus without complications: Secondary | ICD-10-CM | POA: Diagnosis not present

## 2022-01-01 DIAGNOSIS — Z23 Encounter for immunization: Secondary | ICD-10-CM

## 2022-01-01 DIAGNOSIS — I10 Essential (primary) hypertension: Secondary | ICD-10-CM

## 2022-01-01 LAB — BAYER DCA HB A1C WAIVED: HB A1C (BAYER DCA - WAIVED): 7 % — ABNORMAL HIGH (ref 4.8–5.6)

## 2022-01-01 MED ORDER — METFORMIN HCL ER 750 MG PO TB24: ORAL_TABLET | ORAL | 2 refills | Status: DC

## 2022-01-01 NOTE — Progress Notes (Signed)
Subjective:  Patient ID: CLEAVEN DEMARIO,  male    DOB: 06-05-1965  Age: 56 y.o.    CC: Medical Management of Chronic Issues   HPI DEMITRIUS CRASS presents for  follow-up of hypertension. Patient has no history of headache chest pain or shortness of breath or recent cough. Patient also denies symptoms of TIA such as numbness weakness lateralizing. Patient denies side effects from medication. States taking it regularly.  Patient also  in for follow-up of elevated cholesterol. Doing well without complaints on current medication. Denies side effects  including myalgia and arthralgia and nausea. Also in today for liver function testing. Currently no chest pain, shortness of breath or other cardiovascular related symptoms noted.  Follow-up of diabetes. Patient does check blood sugar at home. Readings run between 100 fasting and 80 after work - 4 hours after eating usually Patient denies symptoms such as excessive hunger or urinary frequency, excessive hunger, nausea No significant hypoglycemic spells noted. Medications reviewed. Pt reports taking them regularly. Pt. denies complication/adverse reaction today.    History Jaycub has a past medical history of Complication of anesthesia, Erectile dysfunction, Hodgkin's lymphoma ([place]), Hyperlipidemia, Hypertension, Infertility, Low testosterone, and PONV (postoperative nausea and vomiting).   He has a past surgical history that includes Splenectomy (Left, Over 20 years ago); PORTA CATH INSERTION; PORTA CATH REMOVAL; Axillary lymph node biopsy (Right, 05/10/2018); Portacath placement (Left, 05/21/2018); IR LYMPHANGIOGRAM EXTREMITY UNI LEFT (03/02/1987); and Port-a-cath removal (Left, 06/24/2021).   His family history includes COPD in his father; Cancer in his father and paternal uncle; Diabetes in his father; Hypertension in his brother, father, and mother; Stroke in his brother.He reports that he has never smoked. He has never used smokeless tobacco. He  reports that he does not drink alcohol and does not use drugs.  Current Outpatient Medications on File Prior to Visit  Medication Sig Dispense Refill   ACCU-CHEK GUIDE test strip CHECK SUGAR UP TO 4 TIMES DAILY DX E11.9 400 strip 3   Accu-Chek Softclix Lancets lancets SMARTSIG:2 Topical Twice Daily PRN     amLODipine (NORVASC) 10 MG tablet Take 1 tablet (10 mg total) by mouth daily. 90 tablet 3   Ascorbic Acid (VITAMIN C) 1000 MG tablet Take 1,000 mg by mouth daily. Vitamin c lozenge     Multiple Vitamins-Minerals (ONE-A-DAY MENS HEALTH FORMULA PO) Take 1 tablet by mouth daily.      rosuvastatin (CRESTOR) 10 MG tablet TAKE 1 TABLET BY MOUTH DAILY. FOR CHOLESTEROL 90 tablet 3   No current facility-administered medications on file prior to visit.    ROS Review of Systems  Constitutional:  Negative for fever.  Respiratory:  Negative for shortness of breath.   Cardiovascular:  Negative for chest pain.  Musculoskeletal:  Negative for arthralgias.  Skin:  Negative for rash.    Objective:  BP 120/74   Pulse 79   Temp 97.6 F (36.4 C)   Ht _0  (1.854 m)   Wt 230 lb 6.4 oz (104.5 kg)   SpO2 98%   BMI 30.40 kg/m   BP Readings from Last 3 Encounters:  01/01/22 120/74  09/30/21 119/80  08/23/21 (!) 133/97    Wt Readings from Last 3 Encounters:  01/01/22 230 lb 6.4 oz (104.5 kg)  09/30/21 230 lb (104.3 kg)  06/25/21 233 lb (105.7 kg)     Physical Exam Vitals reviewed.  Constitutional:      Appearance: He is well-developed.  HENT:     Head: Normocephalic and  atraumatic.     Right Ear: External ear normal.     Left Ear: External ear normal.     Mouth/Throat:     Pharynx: No oropharyngeal exudate or posterior oropharyngeal erythema.  Eyes:     Pupils: Pupils are equal, round, and reactive to light.  Cardiovascular:     Rate and Rhythm: Normal rate and regular rhythm.     Heart sounds: No murmur heard. Pulmonary:     Effort: No respiratory distress.     Breath sounds:  Normal breath sounds.  Musculoskeletal:     Cervical back: Normal range of motion and neck supple.  Neurological:     Mental Status: He is alert and oriented to person, place, and time.     Diabetic Foot Exam - Simple   No data filed     Lab Results  Component Value Date   HGBA1C 7.6 (H) 09/30/2021   HGBA1C 7.3 (H) 06/25/2021   HGBA1C 7.0 (H) 12/26/2020    Assessment & Plan:   Khali was seen today for medical management of chronic issues.  Diagnoses and all orders for this visit:  Mixed hyperlipidemia -     Lipid panel  Essential hypertension -     CBC with Differential/Platelet -     CMP14+EGFR  Type 2 diabetes mellitus without complication, without long-term current use of insulin (HCC) -     Bayer DCA Hb A1c Waived -     metFORMIN (GLUCOPHAGE-XR) 750 MG 24 hr tablet; TAKE 1 TABLET BY MOUTH EVERY DAY WITH BREAKFAST   I am having Goble L. Owens Shark maintain his Multiple Vitamins-Minerals (ONE-A-DAY MENS HEALTH FORMULA PO), vitamin C, Accu-Chek Softclix Lancets, Accu-Chek Guide, amLODipine, rosuvastatin, and metFORMIN.  Meds ordered this encounter  Medications   metFORMIN (GLUCOPHAGE-XR) 750 MG 24 hr tablet    Sig: TAKE 1 TABLET BY MOUTH EVERY DAY WITH BREAKFAST    Dispense:  90 tablet    Refill:  2     Follow-up: No follow-ups on file.  [name], M.D.

## 2022-01-02 LAB — CBC WITH DIFFERENTIAL/PLATELET
Basophils Absolute: 0 10*3/uL (ref 0.0–0.2)
Basos: 1 %
EOS (ABSOLUTE): 0.1 10*3/uL (ref 0.0–0.4)
Eos: 3 %
Hematocrit: 42.7 % (ref 37.5–51.0)
Hemoglobin: 14.2 g/dL (ref 13.0–17.7)
Immature Grans (Abs): 0 10*3/uL (ref 0.0–0.1)
Immature Granulocytes: 0 %
Lymphocytes Absolute: 1.7 10*3/uL (ref 0.7–3.1)
Lymphs: 47 %
MCH: 27.9 pg (ref 26.6–33.0)
MCHC: 33.3 g/dL (ref 31.5–35.7)
MCV: 84 fL (ref 79–97)
Monocytes Absolute: 0.7 10*3/uL (ref 0.1–0.9)
Monocytes: 18 %
Neutrophils Absolute: 1.1 10*3/uL — ABNORMAL LOW (ref 1.4–7.0)
Neutrophils: 31 %
Platelets: 229 10*3/uL (ref 150–450)
RBC: 5.09 x10E6/uL (ref 4.14–5.80)
RDW: 14.5 % (ref 11.6–15.4)
WBC: 3.6 10*3/uL (ref 3.4–10.8)

## 2022-01-02 LAB — CMP14+EGFR
ALT: 17 IU/L (ref 0–44)
AST: 17 IU/L (ref 0–40)
Albumin/Globulin Ratio: 2.5 — ABNORMAL HIGH (ref 1.2–2.2)
Albumin: 5 g/dL — ABNORMAL HIGH (ref 3.8–4.9)
Alkaline Phosphatase: 79 IU/L (ref 44–121)
BUN/Creatinine Ratio: 13 (ref 9–20)
BUN: 16 mg/dL (ref 6–24)
Bilirubin Total: 0.4 mg/dL (ref 0.0–1.2)
CO2: 24 mmol/L (ref 20–29)
Calcium: 9.7 mg/dL (ref 8.7–10.2)
Chloride: 105 mmol/L (ref 96–106)
Creatinine, Ser: 1.21 mg/dL (ref 0.76–1.27)
Globulin, Total: 2 g/dL (ref 1.5–4.5)
Glucose: 86 mg/dL (ref 70–99)
Potassium: 3.9 mmol/L (ref 3.5–5.2)
Sodium: 143 mmol/L (ref 134–144)
Total Protein: 7 g/dL (ref 6.0–8.5)
eGFR: 71 mL/min/{1.73_m2} (ref >59)

## 2022-01-02 LAB — LIPID PANEL
Chol/HDL Ratio: 2.6 ratio (ref 0.0–5.0)
Cholesterol, Total: 119 mg/dL (ref 100–199)
HDL: 46 mg/dL (ref >39)
LDL Chol Calc (NIH): 59 mg/dL (ref 0–99)
Triglycerides: 65 mg/dL (ref 0–149)
VLDL Cholesterol Cal: 14 mg/dL (ref 5–40)

## 2022-01-05 NOTE — Progress Notes (Signed)
Hello Devin Tran,  Your lab result is normal and/or stable.Some minor variations that are not significant are commonly marked abnormal, but do not represent any medical problem for you.  Best regards, Genice Kimberlin, M.D.

## 2022-04-03 ENCOUNTER — Ambulatory Visit: Payer: 59 | Admitting: Family Medicine

## 2022-04-07 ENCOUNTER — Encounter: Payer: Self-pay | Admitting: Family Medicine

## 2022-04-07 ENCOUNTER — Ambulatory Visit (INDEPENDENT_AMBULATORY_CARE_PROVIDER_SITE_OTHER): Payer: 59 | Admitting: Family Medicine

## 2022-04-07 VITALS — BP 107/70 | HR 71 | Temp 97.5°F | Ht 73.0 in | Wt 230.8 lb

## 2022-04-07 DIAGNOSIS — E119 Type 2 diabetes mellitus without complications: Secondary | ICD-10-CM

## 2022-04-07 DIAGNOSIS — I10 Essential (primary) hypertension: Secondary | ICD-10-CM

## 2022-04-07 DIAGNOSIS — E782 Mixed hyperlipidemia: Secondary | ICD-10-CM | POA: Diagnosis not present

## 2022-04-07 DIAGNOSIS — M25511 Pain in right shoulder: Secondary | ICD-10-CM

## 2022-04-07 DIAGNOSIS — G8929 Other chronic pain: Secondary | ICD-10-CM

## 2022-04-07 LAB — BAYER DCA HB A1C WAIVED: HB A1C (BAYER DCA - WAIVED): 7.6 % — ABNORMAL HIGH (ref 4.8–5.6)

## 2022-04-07 MED ORDER — METFORMIN HCL ER 750 MG PO TB24: 750.0000 mg | ORAL_TABLET | Freq: Two times a day (BID) | ORAL | 3 refills | Status: DC

## 2022-04-07 MED ORDER — NABUMETONE 500 MG PO TABS: 1000.0000 mg | ORAL_TABLET | Freq: Two times a day (BID) | ORAL | 1 refills | Status: DC

## 2022-04-07 NOTE — Progress Notes (Signed)
Subjective:  Patient ID: Devin Tran,  male    DOB: 1965/09/11  Age: 57 y.o.    CC: Medical Management of Chronic Issues   HPI DIX HOVEL presents for  follow-up of hypertension. Patient has no history of headache chest pain or shortness of breath or recent cough. Patient also denies symptoms of TIA such as numbness weakness lateralizing. Patient denies side effects from medication. States taking it regularly.  Patient also  in for follow-up of elevated cholesterol. Doing well without complaints on current medication. Denies side effects  including myalgia and arthralgia and nausea. Also in today for liver function testing. Currently no chest pain, shortness of breath or other cardiovascular related symptoms noted.  Follow-up of diabetes. Patient does check blood sugar at home. Readings under 130 70 % of the time.  Patient denies symptoms such as excessive hunger or urinary frequency, excessive hunger, nausea No significant hypoglycemic spells noted. Medications reviewed. Pt reports taking them regularly. Pt. denies complication/adverse reaction today.    History Ersel has a past medical history of Complication of anesthesia, Erectile dysfunction, Hodgkin's lymphoma ([facility]), Hyperlipidemia, Hypertension, Infertility, Low testosterone, and PONV (postoperative nausea and vomiting).   He has a past surgical history that includes Splenectomy (Left, Over 20 years ago); PORTA CATH INSERTION; PORTA CATH REMOVAL; Axillary lymph node biopsy (Right, 05/10/2018); Portacath placement (Left, 05/21/2018); IR LYMPHANGIOGRAM EXTREMITY UNI LEFT (03/02/1987); and Port-a-cath removal (Left, 06/24/2021).   His family history includes COPD in his father; Cancer in his father and paternal uncle; Diabetes in his father; Hypertension in his brother, father, and mother; Stroke in his brother.He reports that he has never smoked. He has never used smokeless tobacco. He reports that he does not drink alcohol and does  not use drugs.  Current Outpatient Medications on File Prior to Visit  Medication Sig Dispense Refill   ACCU-CHEK GUIDE test strip CHECK SUGAR UP TO 4 TIMES DAILY DX E11.9 400 strip 3   Accu-Chek Softclix Lancets lancets SMARTSIG:2 Topical Twice Daily PRN     amLODipine (NORVASC) 10 MG tablet Take 1 tablet (10 mg total) by mouth daily. 90 tablet 3   Ascorbic Acid (VITAMIN C) 1000 MG tablet Take 1,000 mg by mouth daily. Vitamin c lozenge     Multiple Vitamins-Minerals (ONE-A-DAY MENS HEALTH FORMULA PO) Take 1 tablet by mouth daily.      rosuvastatin (CRESTOR) 10 MG tablet TAKE 1 TABLET BY MOUTH DAILY. FOR CHOLESTEROL 90 tablet 3   No current facility-administered medications on file prior to visit.    ROS Review of Systems  Constitutional:  Negative for fever.  Respiratory:  Negative for shortness of breath.   Cardiovascular:  Negative for chest pain.  Musculoskeletal:  Positive for arthralgias (soreness at right shoulder. Does a lot of lifting.).  Skin:  Negative for rash.    Objective:  BP 107/70   Pulse 71   Temp (!) 97.5 F (36.4 C)   Ht '6\' 1"'$  (1.854 m)   Wt 230 lb 12.8 oz (104.7 kg)   SpO2 98%   BMI 30.45 kg/m   BP Readings from Last 3 Encounters:  04/07/22 107/70  01/01/22 120/74  09/30/21 119/80    Wt Readings from Last 3 Encounters:  04/07/22 230 lb 12.8 oz (104.7 kg)  01/01/22 230 lb 6.4 oz (104.5 kg)  09/30/21 230 lb (104.3 kg)     Physical Exam Vitals reviewed.  Constitutional:      Appearance: He is well-developed.  HENT:  Head: Normocephalic and atraumatic.     Right Ear: External ear normal.     Left Ear: External ear normal.     Mouth/Throat:     Pharynx: No oropharyngeal exudate or posterior oropharyngeal erythema.  Eyes:     Pupils: Pupils are equal, round, and reactive to light.  Cardiovascular:     Rate and Rhythm: Normal rate and regular rhythm.     Heart sounds: No murmur heard. Pulmonary:     Effort: No respiratory distress.      Breath sounds: Normal breath sounds.  Musculoskeletal:     Cervical back: Normal range of motion and neck supple.  Neurological:     Mental Status: He is alert and oriented to person, place, and time.     Diabetic Foot Exam - Simple   No data filed     Lab Results  Component Value Date   HGBA1C 7.0 (H) 01/01/2022   HGBA1C 7.6 (H) 09/30/2021   HGBA1C 7.3 (H) 06/25/2021    Assessment & Plan:   Hever was seen today for medical management of chronic issues.  Diagnoses and all orders for this visit:  Primary hypertension -     CBC with Differential/Platelet -     CMP14+EGFR  Mixed hyperlipidemia -     Lipid panel  Type 2 diabetes mellitus without complication, without long-term current use of insulin (HCC) -     Bayer DCA Hb A1c Waived -     metFORMIN (GLUCOPHAGE-XR) 750 MG 24 hr tablet; Take 1 tablet (750 mg total) by mouth 2 (two) times daily before a meal.  Chronic right shoulder pain  Other orders -     nabumetone (RELAFEN) 500 MG tablet; Take 2 tablets (1,000 mg total) by mouth 2 (two) times daily. For muscle and joint pain   I have changed Khadar L. Malina's metFORMIN. I am also having him start on nabumetone. Additionally, I am having him maintain his Multiple Vitamins-Minerals (ONE-A-DAY MENS HEALTH FORMULA PO), vitamin C, Accu-Chek Softclix Lancets, Accu-Chek Guide, amLODipine, and rosuvastatin.  Meds ordered this encounter  Medications   metFORMIN (GLUCOPHAGE-XR) 750 MG 24 hr tablet    Sig: Take 1 tablet (750 mg total) by mouth 2 (two) times daily before a meal.    Dispense:  180 tablet    Refill:  3   nabumetone (RELAFEN) 500 MG tablet    Sig: Take 2 tablets (1,000 mg total) by mouth 2 (two) times daily. For muscle and joint pain    Dispense:  360 tablet    Refill:  1     Follow-up: Return in about 3 months (around 07/08/2022).  [name], M.D.

## 2022-04-08 LAB — CBC WITH DIFFERENTIAL/PLATELET
Basophils Absolute: 0 10*3/uL (ref 0.0–0.2)
Basos: 0 %
EOS (ABSOLUTE): 0.1 10*3/uL (ref 0.0–0.4)
Eos: 2 %
Hematocrit: 41.8 % (ref 37.5–51.0)
Hemoglobin: 14.1 g/dL (ref 13.0–17.7)
Immature Grans (Abs): 0 10*3/uL (ref 0.0–0.1)
Immature Granulocytes: 0 %
Lymphocytes Absolute: 1.6 10*3/uL (ref 0.7–3.1)
Lymphs: 42 %
MCH: 28.1 pg (ref 26.6–33.0)
MCHC: 33.7 g/dL (ref 31.5–35.7)
MCV: 83 fL (ref 79–97)
Monocytes Absolute: 0.7 10*3/uL (ref 0.1–0.9)
Monocytes: 18 %
Neutrophils Absolute: 1.4 10*3/uL (ref 1.4–7.0)
Neutrophils: 38 %
Platelets: 232 10*3/uL (ref 150–450)
RBC: 5.01 x10E6/uL (ref 4.14–5.80)
RDW: 14.5 % (ref 11.6–15.4)
WBC: 3.8 10*3/uL (ref 3.4–10.8)

## 2022-04-08 LAB — CMP14+EGFR
ALT: 24 IU/L (ref 0–44)
AST: 20 IU/L (ref 0–40)
Albumin/Globulin Ratio: 2.4 — ABNORMAL HIGH (ref 1.2–2.2)
Albumin: 4.8 g/dL (ref 3.8–4.9)
Alkaline Phosphatase: 69 IU/L (ref 44–121)
BUN/Creatinine Ratio: 18 (ref 9–20)
BUN: 19 mg/dL (ref 6–24)
Bilirubin Total: 0.4 mg/dL (ref 0.0–1.2)
CO2: 22 mmol/L (ref 20–29)
Calcium: 9.8 mg/dL (ref 8.7–10.2)
Chloride: 102 mmol/L (ref 96–106)
Creatinine, Ser: 1.08 mg/dL (ref 0.76–1.27)
Globulin, Total: 2 g/dL (ref 1.5–4.5)
Glucose: 90 mg/dL (ref 70–99)
Potassium: 4 mmol/L (ref 3.5–5.2)
Sodium: 142 mmol/L (ref 134–144)
Total Protein: 6.8 g/dL (ref 6.0–8.5)
eGFR: 82 mL/min/{1.73_m2} (ref >59)

## 2022-04-08 LAB — LIPID PANEL
Chol/HDL Ratio: 2.5 ratio (ref 0.0–5.0)
Cholesterol, Total: 110 mg/dL (ref 100–199)
HDL: 44 mg/dL (ref >39)
LDL Chol Calc (NIH): 52 mg/dL (ref 0–99)
Triglycerides: 68 mg/dL (ref 0–149)
VLDL Cholesterol Cal: 14 mg/dL (ref 5–40)

## 2022-04-08 NOTE — Progress Notes (Signed)
Hello [name],  Your lab result is normal and/or stable.Some minor variations that are not significant are commonly marked abnormal, but do not represent any medical problem for you.  Best regards, [name], M.D.

## 2022-04-21 ENCOUNTER — Other Ambulatory Visit: Payer: Self-pay | Admitting: Family Medicine

## 2022-04-21 DIAGNOSIS — I10 Essential (primary) hypertension: Secondary | ICD-10-CM

## 2022-05-29 ENCOUNTER — Ambulatory Visit (HOSPITAL_COMMUNITY)
Admission: RE | Admit: 2022-05-29 | Discharge: 2022-05-29 | Disposition: A | Discharge status: Home / Self Care | Payer: 59 | Source: Ambulatory Visit | Attending: Hematology | Admitting: Hematology

## 2022-05-29 ENCOUNTER — Inpatient Hospital Stay: Payer: 59 | Attending: Hematology

## 2022-05-29 DIAGNOSIS — C8108 Nodular lymphocyte predominant Hodgkin lymphoma, lymph nodes of multiple sites: Secondary | ICD-10-CM | POA: Insufficient documentation

## 2022-05-29 LAB — COMPREHENSIVE METABOLIC PANEL
ALT: 23 U/L (ref 0–44)
AST: 24 U/L (ref 15–41)
Albumin: 4.5 g/dL (ref 3.5–5.0)
Alkaline Phosphatase: 56 U/L (ref 38–126)
Anion gap: 11 (ref 5–15)
BUN: 16 mg/dL (ref 6–20)
CO2: 24 mmol/L (ref 22–32)
Calcium: 9.2 mg/dL (ref 8.9–10.3)
Chloride: 104 mmol/L (ref 98–111)
Creatinine, Ser: 1.03 mg/dL (ref 0.61–1.24)
GFR, Estimated: >60 mL/min (ref >60)
Glucose, Bld: 141 mg/dL — ABNORMAL HIGH (ref 70–99)
Potassium: 3.6 mmol/L (ref 3.5–5.1)
Sodium: 139 mmol/L (ref 135–145)
Total Bilirubin: 0.8 mg/dL (ref 0.3–1.2)
Total Protein: 7 g/dL (ref 6.5–8.1)

## 2022-05-29 LAB — CBC WITH DIFFERENTIAL/PLATELET
Abs Immature Granulocytes: 0 10*3/uL (ref 0.00–0.07)
Basophils Absolute: 0 10*3/uL (ref 0.0–0.1)
Basophils Relative: 1 %
Eosinophils Absolute: 0.1 10*3/uL (ref 0.0–0.5)
Eosinophils Relative: 3 %
HCT: 42.2 % (ref 39.0–52.0)
Hemoglobin: 13.8 g/dL (ref 13.0–17.0)
Immature Granulocytes: 0 %
Lymphocytes Relative: 49 %
Lymphs Abs: 1.8 10*3/uL (ref 0.7–4.0)
MCH: 27.9 pg (ref 26.0–34.0)
MCHC: 32.7 g/dL (ref 30.0–36.0)
MCV: 85.4 fL (ref 80.0–100.0)
Monocytes Absolute: 0.6 10*3/uL (ref 0.1–1.0)
Monocytes Relative: 16 %
Neutro Abs: 1.2 10*3/uL — ABNORMAL LOW (ref 1.7–7.7)
Neutrophils Relative %: 31 %
Platelets: 203 10*3/uL (ref 150–400)
RBC: 4.94 MIL/uL (ref 4.22–5.81)
RDW: 15 % (ref 11.5–15.5)
WBC: 3.7 10*3/uL — ABNORMAL LOW (ref 4.0–10.5)
nRBC: 0 % (ref 0.0–0.2)

## 2022-05-29 LAB — LACTATE DEHYDROGENASE: LDH: 164 U/L (ref 98–192)

## 2022-05-29 MED ORDER — IOHEXOL 300 MG/ML  SOLN
100.0000 mL | Freq: Once | INTRAMUSCULAR | Status: AC | PRN
  Administered 2022-05-29: 100 mL via INTRAVENOUS

## 2022-06-04 NOTE — Progress Notes (Signed)
[facility] [address] [place] [address]    Clinic Day:  06/05/2022  Referring physician: Mechele Claude, MD  Patient Care Team: Mechele Claude, MD as PCP - General (Family Medicine) Doreatha Massed, MD as Medical Oncologist (Medical Oncology)   ASSESSMENT & PLAN:   Assessment: 1.  NLP Hodgkin's lymphoma, recurrent 3B: -6 cycles of R-CHOP from 05/24/2018 through 09/14/2018. -XRT to the right axillary lymph node completed around November 2020. -PET scan on 03/21/2019 showed right axillary and subpectoral lymph nodes with the largest right axillary lymph node measuring 1.7 cm, [place] 2. -CT CAP on 09/19/2019 showed patchy bilateral groundglass opacities.  Mildly enlarged right axillary nodes largest measuring 13 x 26 mm unchanged since September 2020.  Subpectoral lymph nodes in the right chest and high right axillary lymph nodes without change.  No adenopathy in the abdomen or pelvis.   2.  Diabetes: -He is taking glipizide XR 5 mg daily. -We will check hemoglobin A1c next visit.   3.  Post splenectomy state: -Received Menactra and Pneumovax on 05/25/2018.   Plan: 1.  NLP Hodgkin's lymphoma, recurrent 3B: - He had port removed after last visit. - Denies any B symptoms or recurrent infections. - Tingling or numbness in the extremities also resolved. - Physical exam: No palpable adenopathy or splenomegaly. - Labs: Normal LFTs, LDH and CBC with mild leukopenia and neutropenia stable. - CT CAP 05/09/2022: Stable mildly enlarged right axillary lymph node with no other lymphadenopathy.  Post to splenectomy. - Recommend follow-up in 6 months with repeat labs and exam.  Will consider reimaging in 1 year.    Orders Placed This Encounter  Procedures   CBC with Differential/Platelet    Standing Status:   Future    Standing Expiration Date:   06/05/2023    Order Specific Question:   Release to patient    Answer:   Immediate   Comprehensive metabolic panel     Standing Status:   Future    Standing Expiration Date:   06/05/2023    Order Specific Question:   Release to patient    Answer:   Immediate   Lactate dehydrogenase    Standing Status:   Future    Standing Expiration Date:   06/05/2023    Order Specific Question:   Release to patient    Answer:   Immediate   Sedimentation rate    Standing Status:   Future    Standing Expiration Date:   06/05/2023      I,[name] Daubenspeck,acting as a scribe for Doreatha Massed, MD.,have documented all relevant documentation on the behalf of Doreatha Massed, MD,as directed by  Doreatha Massed, MD while in the presence of Doreatha Massed, MD.   I, Doreatha Massed MD, have reviewed the above documentation for accuracy and completeness, and I agree with the above.   Doreatha Massed, MD   5/2/20244:42 PM  CHIEF COMPLAINT:   Diagnosis: stage IV Hodgkin lymphoma nodular lymphocyte predominantly    Cancer Staging  No matching staging information was found for the patient.   Prior Therapy: 1. R-CHOP x 6 cycles from 05/24/2018 to 09/14/2018. 2. XRT to right axillary lymph nodes around 12/2018.  Current Therapy:  surveillance   HISTORY OF PRESENT ILLNESS:   Oncology History  Nodular lymphocyte predom Hodgkin lymphoma lymph nodes multiple sites (HCC)  05/25/2018 Initial Diagnosis   Nodular lymphocyte predom Hodgkin lymphoma lymph nodes multiple sites [facility])   06/01/2018 - 09/14/2018 Chemotherapy   Patient is on  Treatment Plan : NON-HODGKINS LYMPHOMA R-CHOP q21d        INTERVAL HISTORY:   Devin Tran is a 57 y.o. male presenting to clinic today for follow up of stage IV Hodgkin lymphoma. He was last seen by me on 05/30/21.  Of note since his last visit, he had his port removed on 06/24/21.  He also underwent restaging CT C/A/P on 05/29/22 showing: stable exam, no new or progressive adenopathy.  Today, he states that he is doing well overall. His appetite level is at 100%. His energy  level is at 100%.  PAST MEDICAL HISTORY:   Past Medical History: Past Medical History:  Diagnosis Date   Complication of anesthesia    PONV per anesth notes   Erectile dysfunction    Hodgkin's lymphoma (HCC)    Hyperlipidemia    Hypertension    Infertility    Low testosterone    PONV (postoperative nausea and vomiting)     Surgical History: Past Surgical History:  Procedure Laterality Date   AXILLARY LYMPH NODE BIOPSY Right 05/10/2018   Procedure: AXILLARY LYMPH NODE BIOPSY, RIGHT;  Surgeon: Franky Macho, MD;  Location: [facility];  Service: General;  Laterality: Right;   IR LYMPHANGIOGRAM EXTREMITY UNI LEFT  03/02/1987   PORT-A-CATH REMOVAL Left 06/24/2021   Procedure: MINOR REMOVAL PORT-A-CATH;  Surgeon: Franky Macho, MD;  Location: [facility];  Service: General;  Laterality: Left;   PORTA CATH INSERTION     PORTA CATH REMOVAL     PORTACATH PLACEMENT Left 05/21/2018   Procedure: INSERTION PORT-A-CATH (attached catheter in left subclavian);  Surgeon: Franky Macho, MD;  Location: [facility];  Service: General;  Laterality: Left;   SPLENECTOMY Left Over 20 years ago   Due to Hodgkin's lymphoma    Social History: Social History   Socioeconomic History   Marital status: Single    Spouse name: Not on file   Number of children: Not on file   Years of education: Not on file   Highest education level: Not on file  Occupational History   Occupation: Drive standup truck    Employer: [employer]  Tobacco Use   Smoking status: Never   Smokeless tobacco: Never  Vaping Use   Vaping Use: Not on file  Substance and Sexual Activity   Alcohol use: No   Drug use: No   Sexual activity: Yes  Other Topics Concern   Not on file  Social History Narrative   Not on file   Social Determinants of Health   Financial Resource Strain: Not on file  Food Insecurity: Not on file  Transportation Needs: No Transportation Needs (04/02/2020)   PRAPARE - Transportation    Lack of Transportation  (Medical): No    Lack of Transportation (Non-Medical): No  Physical Activity: Inactive (04/02/2020)   Exercise Vital Sign    Days of Exercise per Week: 0 days    Minutes of Exercise per Session: 0 min  Stress: Not on file  Social Connections: Not on file  Intimate Partner Violence: Not At Risk (04/02/2020)   Humiliation, Afraid, Rape, and Kick questionnaire    Fear of Current or Ex-Partner: No    Emotionally Abused: No    Physically Abused: No    Sexually Abused: No    Family History: Family History  Problem Relation Age of Onset   Hypertension Mother    Cancer Father        lung   Hypertension Father    Diabetes Father    COPD Father  Hypertension Brother    Stroke Brother    Cancer Paternal Uncle        possibly throat cancer   Colon cancer Neg Hx    Esophageal cancer Neg Hx    Rectal cancer Neg Hx    Stomach cancer Neg Hx     Current Medications:  Current Outpatient Medications:    ACCU-CHEK GUIDE test strip, CHECK SUGAR UP TO 4 TIMES DAILY DX E11.9, Disp: 400 strip, Rfl: 3   Accu-Chek Softclix Lancets lancets, SMARTSIG:2 Topical Twice Daily PRN, Disp: , Rfl:    amLODipine (NORVASC) 10 MG tablet, TAKE 1 TABLET BY MOUTH EVERY DAY, Disp: 90 tablet, Rfl: 1   Ascorbic Acid (VITAMIN C) 1000 MG tablet, Take 1,000 mg by mouth daily. Vitamin c lozenge, Disp: , Rfl:    metFORMIN (GLUCOPHAGE-XR) 750 MG 24 hr tablet, Take 1 tablet (750 mg total) by mouth 2 (two) times daily before a meal., Disp: 180 tablet, Rfl: 3   Multiple Vitamins-Minerals (ONE-A-DAY MENS HEALTH FORMULA PO), Take 1 tablet by mouth daily. , Disp: , Rfl:    nabumetone (RELAFEN) 500 MG tablet, Take 2 tablets (1,000 mg total) by mouth 2 (two) times daily. For muscle and joint pain, Disp: 360 tablet, Rfl: 1   rosuvastatin (CRESTOR) 10 MG tablet, TAKE 1 TABLET BY MOUTH DAILY. FOR CHOLESTEROL, Disp: 90 tablet, Rfl: 3   Allergies: Allergies  Allergen Reactions   Bleomycin Other (See Comments)    Cramping all  over    REVIEW OF SYSTEMS:   Review of Systems  Constitutional:  Negative for chills, fatigue and fever.  HENT:   Negative for lump/mass, mouth sores, nosebleeds, sore throat and trouble swallowing.   Eyes:  Negative for eye problems.  Respiratory:  Negative for cough and shortness of breath.   Cardiovascular:  Negative for chest pain, leg swelling and palpitations.  Gastrointestinal:  Negative for abdominal pain, constipation, diarrhea, nausea and vomiting.  Genitourinary:  Negative for bladder incontinence, difficulty urinating, dysuria, frequency, hematuria and nocturia.   Musculoskeletal:  Negative for arthralgias, back pain, flank pain, myalgias and neck pain.  Skin:  Negative for itching and rash.  Neurological:  Negative for dizziness, headaches and numbness.  Hematological:  Does not bruise/bleed easily.  Psychiatric/Behavioral:  Negative for depression, sleep disturbance and suicidal ideas. The patient is not nervous/anxious.   All other systems reviewed and are negative.    VITALS:   Blood pressure (!) 130/94, pulse 83, temperature 98.6 F (37 C), temperature source Oral, resp. rate 16, weight 231 lb 9.6 oz (105.1 kg), SpO2 98 %.  Wt Readings from Last 3 Encounters:  06/05/22 231 lb 9.6 oz (105.1 kg)  04/07/22 230 lb 12.8 oz (104.7 kg)  01/01/22 230 lb 6.4 oz (104.5 kg)    Body mass index is 30.56 kg/m.  Performance status (ECOG): 0 - Asymptomatic  PHYSICAL EXAM:   Physical Exam Vitals and nursing note reviewed. Exam conducted with a chaperone present.  Constitutional:      Appearance: Normal appearance.  Cardiovascular:     Rate and Rhythm: Normal rate and regular rhythm.     Pulses: Normal pulses.     Heart sounds: Normal heart sounds.  Pulmonary:     Effort: Pulmonary effort is normal.     Breath sounds: Normal breath sounds.  Abdominal:     Palpations: Abdomen is soft. There is no hepatomegaly, splenomegaly or mass.     Tenderness: There is no abdominal  tenderness.  Musculoskeletal:  Right lower leg: No edema.     Left lower leg: No edema.  Lymphadenopathy:     Cervical: No cervical adenopathy.     Right cervical: No superficial, deep or posterior cervical adenopathy.    Left cervical: No superficial, deep or posterior cervical adenopathy.     Upper Body:     Right upper body: No supraclavicular or axillary adenopathy.     Left upper body: No supraclavicular or axillary adenopathy.  Neurological:     General: No focal deficit present.     Mental Status: He is alert and oriented to person, place, and time.  Psychiatric:        Mood and Affect: Mood normal.        Behavior: Behavior normal.     LABS:      Latest Ref Rng & Units 05/29/2022    2:43 PM 04/07/2022    3:59 PM 01/01/2022    3:44 PM  CBC  WBC 4.0 - 10.5 K/uL 3.7  3.8  3.6   Hemoglobin 13.0 - 17.0 g/dL 21.3  08.6  57.8   Hematocrit 39.0 - 52.0 % 42.2  41.8  42.7   Platelets 150 - 400 K/uL 203  232  229       Latest Ref Rng & Units 05/29/2022    2:43 PM 04/07/2022    3:59 PM 01/01/2022    3:44 PM  CMP  Glucose 70 - 99 mg/dL 469  90  86   BUN 6 - 20 mg/dL 16  19  16    Creatinine 0.61 - 1.24 mg/dL 6.29  5.28  4.13   Sodium 135 - 145 mmol/L 139  142  143   Potassium 3.5 - 5.1 mmol/L 3.6  4.0  3.9   Chloride 98 - 111 mmol/L 104  102  105   CO2 22 - 32 mmol/L 24  22  24    Calcium 8.9 - 10.3 mg/dL 9.2  9.8  9.7   Total Protein 6.5 - 8.1 g/dL 7.0  6.8  7.0   Total Bilirubin 0.3 - 1.2 mg/dL 0.8  0.4  0.4   Alkaline Phos 38 - 126 U/L 56  69  79   AST 15 - 41 U/L 24  20  17    ALT 0 - 44 U/L 23  24  17       No results found for: "CEA1", "CEA" / No results found for: "CEA1", "CEA" Lab Results  Component Value Date   PSA1 0.4 09/30/2021   No results found for: "KGM010" No results found for: "CAN125"  No results found for: "TOTALPROTELP", "ALBUMINELP", "A1GS", "A2GS", "BETS", "BETA2SER", "GAMS", "MSPIKE", "SPEI" No results found for: "TIBC", "FERRITIN",  "IRONPCTSAT" Lab Results  Component Value Date   LDH 164 05/29/2022   LDH 153 05/23/2021   LDH 155 11/22/2020     STUDIES:   CT CHEST ABDOMEN PELVIS W CONTRAST  Result Date: 05/30/2022 CLINICAL DATA:  Hematologic malignancy, monitor. * Tracking Code: BO * EXAM: CT CHEST, ABDOMEN, AND PELVIS WITH CONTRAST TECHNIQUE: Multidetector CT imaging of the chest, abdomen and pelvis was performed following the standard protocol during bolus administration of intravenous contrast. RADIATION DOSE REDUCTION: This exam was performed according to the departmental dose-optimization program which includes automated exposure control, adjustment of the mA and/or kV according to patient size and/or use of iterative reconstruction technique. CONTRAST:  OMNIPAQUE IOHEXOL 300 MG/ML  SOLN COMPARISON:  Multiple priors including most recent CT May 23, 2021 FINDINGS: CT CHEST  FINDINGS Cardiovascular: Aortic atherosclerosis. Normal caliber thoracic aorta. No central pulmonary embolus on this nondedicated study. Normal size heart. No significant pericardial effusion/thickening. Mediastinum/Nodes: Stable prominent/mildly enlarged right axillary lymph node measuring 13 mm in short axis on image 17/2 previously 14 mm. Adjacent right axillary surgical clips. No new or progressive mediastinal, hilar or axillary adenopathy. Lungs/Pleura: Similar scarring in the bilateral lung bases. Hypoventilatory change in the dependent lungs. No suspicious pulmonary nodules or masses. No pleural effusion. No pneumothorax. Musculoskeletal: No aggressive lytic or blastic lesion of bone. Thoracic diffuse idiopathic skeletal hyperostosis. Degenerative subchondral cyst formation in the bilateral shoulders. CT ABDOMEN PELVIS FINDINGS Hepatobiliary: Subtle hyperenhancing 13 mm focus in the lateral dome of the liver on image 45/2 unchanged back to at least 2021 likely reflecting a intrahepatic shunt or hemangioma. Gallbladder is unremarkable. No  biliary ductal dilation. Pancreas: No pancreatic ductal dilation or evidence of acute inflammation. Spleen: Spleen is surgically absent. Adrenals/Urinary Tract: Bilateral adrenal glands appear normal. No hydronephrosis. Bilateral fluid density renal lesions measuring up to 11.3 cm are compatible with cysts and considered benign requiring no independent imaging follow-up. Additional hypodense bilateral renal lesions are technically too small to accurately characterize but statistically likely reflect cysts and considered benign requiring no independent imaging follow-up. Urinary bladder is unremarkable for degree of distension. Stomach/Bowel: No radiopaque enteric contrast material was administered. Stomach is unremarkable for degree of distension. No pathologic dilation of small or large bowel. Normal appendix. No evidence of acute bowel inflammation. Vascular/Lymphatic: Aortic atherosclerosis. Normal caliber abdominal aorta. Smooth IVC contours. The portal, splenic and superior mesenteric veins are patent. No pathologically enlarged abdominal or pelvic lymph nodes. Reproductive: Prostate is unremarkable. Other: No significant abdominopelvic free fluid. Musculoskeletal: Intramuscular lipoma in the right gluteus minor muscle. Stable sclerotic focus in the right hemi sacrum on image 99/2 and left L5 vertebral body on image 93/2 favored to reflect a bone island. No aggressive lytic or blastic lesion of bone. IMPRESSION: 1. Stable prominent/mildly enlarged right axillary lymph node with adjacent surgical clips. No new or progressive adenopathy in the chest, abdomen, or pelvis. 2. Status post splenectomy. 3. Stable subtle hyperenhancing 13 mm focus in the lateral dome of the liver, likely reflecting a hemangioma or intrahepatic shunt. 4.  Aortic Atherosclerosis (ICD10-I70.0). Electronically Signed   By: Maudry Mayhew M.D.   On: 05/30/2022 06:13

## 2022-06-05 ENCOUNTER — Inpatient Hospital Stay: Payer: 59 | Attending: Hematology | Admitting: Hematology

## 2022-06-05 VITALS — BP 130/94 | HR 83 | Temp 98.6°F | Resp 16 | Wt 231.6 lb

## 2022-06-05 DIAGNOSIS — E119 Type 2 diabetes mellitus without complications: Secondary | ICD-10-CM | POA: Insufficient documentation

## 2022-06-05 DIAGNOSIS — C8108 Nodular lymphocyte predominant Hodgkin lymphoma, lymph nodes of multiple sites: Secondary | ICD-10-CM | POA: Diagnosis present

## 2022-06-05 NOTE — Patient Instructions (Addendum)
[facility]  Discharge Instructions  You were seen and examined today by Dr. Ellin Saba.  Dr. Ellin Saba discussed your most recent lab work and CT scan which revealed that everything looks good and stable.  Follow-up as scheduled in 6 months.    Thank you for choosing [facility] to provide your oncology and hematology care.   To afford each patient quality time with our provider, please arrive at least 15 minutes before your scheduled appointment time. You may need to reschedule your appointment if you arrive late (10 or more minutes). Arriving late affects you and other patients whose appointments are after yours.  Also, if you miss three or more appointments without notifying the office, you may be dismissed from the clinic at the provider's discretion.    Again, thank you for choosing [facility].  Our hope is that these requests will decrease the amount of time that you wait before being seen by our physicians.   If you have a lab appointment with the Cancer Center - please note that after April 8th, all labs will be drawn in the cancer center.  You do not have to check in or register with the main entrance as you have in the past but will complete your check-in at the cancer center.            _____________________________________________________________  Should you have questions after your visit to [facility], please contact our office at [phone] and follow the prompts.  Our office hours are 8:00 a.m. to 4:30 p.m. Monday - Thursday and 8:00 a.m. to 2:30 p.m. Friday.  Please note that voicemails left after 4:00 p.m. may not be returned until the following business day.  We are closed weekends and all major holidays.  You do have access to a nurse 24-7, just call the main number to the clinic [phone] and do not press any options, hold on the line and a nurse will answer the phone.    For  prescription refill requests, have your pharmacy contact our office and allow 72 hours.    Masks are no longer required in the cancer centers. If you would like for your care team to wear a mask while they are taking care of you, please let them know. You may have one support person who is at least 55 years old accompany you for your appointments.

## 2022-07-10 ENCOUNTER — Ambulatory Visit (INDEPENDENT_AMBULATORY_CARE_PROVIDER_SITE_OTHER): Payer: 59 | Admitting: Family Medicine

## 2022-07-10 ENCOUNTER — Encounter: Payer: Self-pay | Admitting: Family Medicine

## 2022-07-10 VITALS — BP 112/73 | HR 68 | Temp 97.5°F | Ht 73.0 in | Wt 232.0 lb

## 2022-07-10 DIAGNOSIS — I10 Essential (primary) hypertension: Secondary | ICD-10-CM | POA: Diagnosis not present

## 2022-07-10 DIAGNOSIS — E119 Type 2 diabetes mellitus without complications: Secondary | ICD-10-CM

## 2022-07-10 DIAGNOSIS — E782 Mixed hyperlipidemia: Secondary | ICD-10-CM

## 2022-07-10 LAB — BAYER DCA HB A1C WAIVED: HB A1C (BAYER DCA - WAIVED): 7 % — ABNORMAL HIGH (ref 4.8–5.6)

## 2022-07-10 NOTE — Progress Notes (Signed)
Subjective:  Patient ID: Devin Tran,  male    DOB: [birth year]  Age: 57 y.o.    CC: Medical Management of Chronic Issues   HPI Devin Tran presents for  follow-up of hypertension. Patient has no history of headache chest pain or shortness of breath or recent cough. Patient also denies symptoms of TIA such as numbness weakness lateralizing. Patient denies side effects from medication. States taking it regularly.  Patient also  in for follow-up of elevated cholesterol. Doing well without complaints on current medication. Denies side effects  including myalgia and arthralgia and nausea. Also in Tran for liver function testing. Currently no chest pain, shortness of breath or other cardiovascular related symptoms noted.  Follow-up of diabetes. Patient does check blood sugar at home. Readings run between 130-140s fasting. and 80-100 postprandial. Walking and playing baasketball for exercise Patient denies symptoms such as excessive hunger or urinary frequency, excessive hunger, nausea No significant hypoglycemic spells noted. Medications reviewed. Pt reports taking them regularly. Pt. denies complication/adverse reaction Tran.    History Devin Tran has a past medical history of Complication of anesthesia, Erectile dysfunction, Hodgkin's lymphoma (HCC), Hyperlipidemia, Hypertension, Infertility, Low testosterone, and PONV (postoperative nausea and vomiting).   Devin Tran has a past surgical history that includes Splenectomy (Left, Over 20 years ago); PORTA CATH INSERTION; PORTA CATH REMOVAL; Axillary lymph node biopsy (Right, 05/10/2018); Portacath placement (Left, 05/21/2018); IR LYMPHANGIOGRAM EXTREMITY UNI LEFT (03/02/1987); and Port-a-cath removal (Left, 06/24/2021).   His family history includes COPD in his father; Cancer in his father and paternal uncle; Diabetes in his father; Hypertension in his brother, father, and mother; Stroke in his brother.Devin Tran reports that Devin Tran has never smoked. Devin Tran has never used  smokeless tobacco. Devin Tran reports that Devin Tran does not drink alcohol and does not use drugs.  Current Outpatient Medications on File Prior to Visit  Medication Sig Dispense Refill   ACCU-CHEK GUIDE test strip CHECK SUGAR UP TO 4 TIMES DAILY DX E11.9 400 strip 3   Accu-Chek Softclix Lancets lancets SMARTSIG:2 Topical Twice Daily PRN     amLODipine (NORVASC) 10 MG tablet TAKE 1 TABLET BY MOUTH EVERY DAY 90 tablet 1   Ascorbic Acid (VITAMIN C) 1000 MG tablet Take 1,000 mg by mouth daily. Vitamin c lozenge     metFORMIN (GLUCOPHAGE-XR) 750 MG 24 hr tablet Take 1 tablet (750 mg total) by mouth 2 (two) times daily before a meal. 180 tablet 3   Multiple Vitamins-Minerals (ONE-A-DAY MENS HEALTH FORMULA PO) Take 1 tablet by mouth daily.      nabumetone (RELAFEN) 500 MG tablet Take 2 tablets (1,000 mg total) by mouth 2 (two) times daily. For muscle and joint pain 360 tablet 1   rosuvastatin (CRESTOR) 10 MG tablet TAKE 1 TABLET BY MOUTH DAILY. FOR CHOLESTEROL 90 tablet 3   No current facility-administered medications on file prior to visit.    ROS Review of Systems  Constitutional:  Negative for fever.  Respiratory:  Negative for shortness of breath.   Cardiovascular:  Negative for chest pain.  Musculoskeletal:  Negative for arthralgias.  Skin:  Negative for rash.    Objective:  BP 112/73   Pulse 68   Temp (!) 97.5 F (36.4 C)   Ht 6\' 1"  (1.854 m)   Wt 232 lb (105.2 kg)   SpO2 96%   BMI 30.61 kg/m   BP Readings from Last 3 Encounters:  07/10/22 112/73  06/05/22 (!) 130/94  04/07/22 107/70    Wt Readings from Last  3 Encounters:  07/10/22 232 lb (105.2 kg)  06/05/22 231 lb 9.6 oz (105.1 kg)  04/07/22 230 lb 12.8 oz (104.7 kg)     Physical Exam Vitals reviewed.  Constitutional:      Appearance: Devin Tran is well-developed.  HENT:     Head: Normocephalic and atraumatic.     Right Ear: External ear normal.     Left Ear: External ear normal.     Mouth/Throat:     Pharynx: No oropharyngeal  exudate or posterior oropharyngeal erythema.  Eyes:     Pupils: Pupils are equal, round, and reactive to light.  Cardiovascular:     Rate and Rhythm: Normal rate and regular rhythm.     Heart sounds: No murmur heard. Pulmonary:     Effort: No respiratory distress.     Breath sounds: Normal breath sounds.  Musculoskeletal:     Cervical back: Normal range of motion and neck supple.  Neurological:     Mental Status: Devin Tran is alert and oriented to person, place, and time.     Diabetic Foot Exam - Simple   No data filed     Lab Results  Component Value Date   HGBA1C 7.6 (H) 04/07/2022   HGBA1C 7.0 (H) 01/01/2022   HGBA1C 7.6 (H) 09/30/2021    Assessment & Plan:   Devin Tran for medical management of chronic issues.  Diagnoses and all orders for this visit:  Primary hypertension -     CMP14+EGFR -     Bayer DCA Hb A1c Waived  Mixed hyperlipidemia -     Lipid panel  Type 2 diabetes mellitus without complication, without long-term current use of insulin (HCC) -     CBC with Differential/Platelet   I am having Devin Tran maintain his Multiple Vitamins-Minerals (ONE-A-DAY MENS HEALTH FORMULA PO), vitamin C, Accu-Chek Softclix Lancets, Accu-Chek Guide, rosuvastatin, metFORMIN, nabumetone, and amLODipine.  No orders of the defined types were placed in this encounter.    Follow-up: Return in about 3 months (around 10/10/2022).  Mechele Claude, M.D.

## 2022-07-11 LAB — CMP14+EGFR
ALT: 24 IU/L (ref 0–44)
AST: 21 IU/L (ref 0–40)
Albumin/Globulin Ratio: 2.6 — ABNORMAL HIGH (ref 1.2–2.2)
Albumin: 4.9 g/dL (ref 3.8–4.9)
Alkaline Phosphatase: 75 IU/L (ref 44–121)
BUN/Creatinine Ratio: 17 (ref 9–20)
BUN: 19 mg/dL (ref 6–24)
Bilirubin Total: 0.4 mg/dL (ref 0.0–1.2)
CO2: 25 mmol/L (ref 20–29)
Calcium: 9.5 mg/dL (ref 8.7–10.2)
Chloride: 104 mmol/L (ref 96–106)
Creatinine, Ser: 1.13 mg/dL (ref 0.76–1.27)
Globulin, Total: 1.9 g/dL (ref 1.5–4.5)
Glucose: 83 mg/dL (ref 70–99)
Potassium: 4 mmol/L (ref 3.5–5.2)
Sodium: 142 mmol/L (ref 134–144)
Total Protein: 6.8 g/dL (ref 6.0–8.5)
eGFR: 77 mL/min/{1.73_m2} (ref >59)

## 2022-07-11 LAB — CBC WITH DIFFERENTIAL/PLATELET
Basophils Absolute: 0 10*3/uL (ref 0.0–0.2)
Basos: 1 %
EOS (ABSOLUTE): 0.1 10*3/uL (ref 0.0–0.4)
Eos: 2 %
Hematocrit: 43 % (ref 37.5–51.0)
Hemoglobin: 13.8 g/dL (ref 13.0–17.7)
Immature Grans (Abs): 0 10*3/uL (ref 0.0–0.1)
Immature Granulocytes: 0 %
Lymphocytes Absolute: 2 10*3/uL (ref 0.7–3.1)
Lymphs: 53 %
MCH: 27.3 pg (ref 26.6–33.0)
MCHC: 32.1 g/dL (ref 31.5–35.7)
MCV: 85 fL (ref 79–97)
Monocytes Absolute: 0.6 10*3/uL (ref 0.1–0.9)
Monocytes: 16 %
Neutrophils Absolute: 1.1 10*3/uL — ABNORMAL LOW (ref 1.4–7.0)
Neutrophils: 28 %
Platelets: 202 10*3/uL (ref 150–450)
RBC: 5.06 x10E6/uL (ref 4.14–5.80)
RDW: 14.1 % (ref 11.6–15.4)
WBC: 3.8 10*3/uL (ref 3.4–10.8)

## 2022-07-11 LAB — LIPID PANEL
Chol/HDL Ratio: 2.5 ratio (ref 0.0–5.0)
Cholesterol, Total: 119 mg/dL (ref 100–199)
HDL: 47 mg/dL (ref >39)
LDL Chol Calc (NIH): 58 mg/dL (ref 0–99)
Triglycerides: 67 mg/dL (ref 0–149)
VLDL Cholesterol Cal: 14 mg/dL (ref 5–40)

## 2022-07-13 NOTE — Progress Notes (Signed)
Hello Amdrew,  Your lab result is normal and/or stable.Some minor variations that are not significant are commonly marked abnormal, but do not represent any medical problem for you.  Best regards, Gaylynn Seiple, M.D.

## 2022-10-01 ENCOUNTER — Other Ambulatory Visit: Payer: Self-pay | Admitting: Family Medicine

## 2022-10-01 DIAGNOSIS — E782 Mixed hyperlipidemia: Secondary | ICD-10-CM

## 2022-10-13 ENCOUNTER — Ambulatory Visit: Payer: 59 | Admitting: Family Medicine

## 2022-10-23 ENCOUNTER — Encounter: Payer: Self-pay | Admitting: Family Medicine

## 2022-10-23 ENCOUNTER — Ambulatory Visit (INDEPENDENT_AMBULATORY_CARE_PROVIDER_SITE_OTHER): Payer: 59 | Admitting: Family Medicine

## 2022-10-23 VITALS — BP 120/73 | HR 68 | Temp 98.1°F | Ht 73.0 in | Wt 231.6 lb

## 2022-10-23 DIAGNOSIS — Z7984 Long term (current) use of oral hypoglycemic drugs: Secondary | ICD-10-CM

## 2022-10-23 DIAGNOSIS — E119 Type 2 diabetes mellitus without complications: Secondary | ICD-10-CM

## 2022-10-23 DIAGNOSIS — Z23 Encounter for immunization: Secondary | ICD-10-CM | POA: Diagnosis not present

## 2022-10-23 DIAGNOSIS — I10 Essential (primary) hypertension: Secondary | ICD-10-CM | POA: Diagnosis not present

## 2022-10-23 DIAGNOSIS — E782 Mixed hyperlipidemia: Secondary | ICD-10-CM | POA: Diagnosis not present

## 2022-10-23 NOTE — Progress Notes (Signed)
Subjective:  Patient ID: Devin Tran, male    DOB: 07/31/1965  Age: 57 y.o. MRN: [id number]  CC: Medical Management of Chronic Issues   HPI Devin Tran presents for  presents for  follow-up of hypertension. Patient has no history of headache chest pain or shortness of breath or recent cough. Patient also denies symptoms of TIA such as focal numbness or weakness. Patient denies side effects from medication. States taking it regularly.  presents forFollow-up of diabetes. Patient denies symptoms such as polyuria, polydipsia, excessive hunger, nausea No significant hypoglycemic spells noted. Medications reviewed. Pt reports taking them regularly without complication/adverse reaction being reported today.  Lab Results  Component Value Date   HGBA1C 6.9 (H) 10/23/2022   HGBA1C 7.0 (H) 07/10/2022   HGBA1C 7.6 (H) 04/07/2022      in for follow-up of elevated cholesterol. Doing well without complaints on current medication. Denies side effects of statin including myalgia and arthralgia and nausea. Currently no chest pain, shortness of breath or other cardiovascular related symptoms noted.      10/23/2022    4:12 PM 07/10/2022    3:55 PM 04/07/2022    3:51 PM  Depression screen PHQ 2/9  Decreased Interest 0 0 0  Down, Depressed, Hopeless 0 0 0  PHQ - 2 Score 0 0 0    History Devin Tran has a past medical history of Complication of anesthesia, Erectile dysfunction, Hodgkin's lymphoma (HCC), Hyperlipidemia, Hypertension, Infertility, Low testosterone, and PONV (postoperative nausea and vomiting).   He has a past surgical history that includes Splenectomy (Left, Over 20 years ago); PORTA CATH INSERTION; PORTA CATH REMOVAL; Axillary lymph node biopsy (Right, 05/10/2018); Portacath placement (Left, 05/21/2018); IR LYMPHANGIOGRAM EXTREMITY UNI LEFT (03/02/1987); and Port-a-cath removal (Left, 06/24/2021).   His family history includes COPD in his father; Cancer in his father and paternal uncle; Diabetes  in his father; Hypertension in his brother, father, and mother; Stroke in his brother.He reports that he has never smoked. He has never used smokeless tobacco. He reports that he does not drink alcohol and does not use drugs.    ROS Review of Systems  Objective:  BP 120/73   Pulse 68   Temp 98.1 F (36.7 C)   Ht 6\' 1"  (1.854 m)   Wt 231 lb 9.6 oz (105.1 kg)   SpO2 96%   BMI 30.56 kg/m   BP Readings from Last 3 Encounters:  10/23/22 120/73  07/10/22 112/73  06/05/22 (!) 130/94    Wt Readings from Last 3 Encounters:  10/23/22 231 lb 9.6 oz (105.1 kg)  07/10/22 232 lb (105.2 kg)  06/05/22 231 lb 9.6 oz (105.1 kg)     Physical Exam    Assessment & Plan:   Devin Tran was seen today for medical management of chronic issues.  Diagnoses and all orders for this visit:  Primary hypertension -     CBC with Differential/Platelet -     CMP14+EGFR  Mixed hyperlipidemia -     Lipid panel -     rosuvastatin (CRESTOR) 10 MG tablet; TAKE 1 TABLET BY MOUTH EVERY DAY FOR CHOLESTEROL  Type 2 diabetes mellitus without complication, without long-term current use of insulin (HCC) -     Bayer DCA Hb A1c Waived  Need for influenza vaccination -     Flu vaccine trivalent PF, 6mos and older(Flulaval,Afluria,Fluarix,Fluzone)  Other orders -     amLODipine (NORVASC) 10 MG tablet; Take 1 tablet (10 mg total) by mouth daily. -  nabumetone (RELAFEN) 500 MG tablet; Take 2 tablets (1,000 mg total) by mouth 2 (two) times daily. For muscle and joint pain       I have changed Devin Tran's amLODipine. I am also having him maintain his Multiple Vitamins-Minerals (ONE-A-DAY MENS HEALTH FORMULA PO), vitamin C, Accu-Chek Softclix Lancets, Accu-Chek Guide, metFORMIN, nabumetone, and rosuvastatin.  Allergies as of 10/23/2022       Reactions   Bleomycin Other (See Comments)   Cramping all over        Medication List        Accurate as of October 23, 2022 11:59 PM. If you have any  questions, ask your nurse or doctor.          Accu-Chek Guide test strip Generic drug: glucose blood CHECK SUGAR UP TO 4 TIMES DAILY DX E11.9   Accu-Chek Softclix Lancets lancets SMARTSIG:2 Topical Twice Daily PRN   amLODipine 10 MG tablet Commonly known as: NORVASC Take 1 tablet (10 mg total) by mouth daily.   metFORMIN 750 MG 24 hr tablet Commonly known as: GLUCOPHAGE-XR Take 1 tablet (750 mg total) by mouth 2 (two) times daily before a meal.   nabumetone 500 MG tablet Commonly known as: RELAFEN Take 2 tablets (1,000 mg total) by mouth 2 (two) times daily. For muscle and joint pain   ONE-A-DAY MENS HEALTH FORMULA PO Take 1 tablet by mouth daily.   rosuvastatin 10 MG tablet Commonly known as: CRESTOR TAKE 1 TABLET BY MOUTH EVERY DAY FOR CHOLESTEROL   vitamin C 1000 MG tablet Take 1,000 mg by mouth daily. Vitamin c lozenge         Follow-up: Return in about 3 months (around 01/22/2023).  Mechele Claude, M.D.

## 2022-10-24 LAB — CMP14+EGFR
ALT: 23 IU/L (ref 0–44)
AST: 23 IU/L (ref 0–40)
Albumin: 4.7 g/dL (ref 3.8–4.9)
Alkaline Phosphatase: 73 IU/L (ref 44–121)
BUN/Creatinine Ratio: 16 (ref 9–20)
BUN: 18 mg/dL (ref 6–24)
Bilirubin Total: 0.4 mg/dL (ref 0.0–1.2)
CO2: 21 mmol/L (ref 20–29)
Calcium: 9.6 mg/dL (ref 8.7–10.2)
Chloride: 106 mmol/L (ref 96–106)
Creatinine, Ser: 1.11 mg/dL (ref 0.76–1.27)
Globulin, Total: 2 g/dL (ref 1.5–4.5)
Glucose: 84 mg/dL (ref 70–99)
Potassium: 4.1 mmol/L (ref 3.5–5.2)
Sodium: 145 mmol/L — ABNORMAL HIGH (ref 134–144)
Total Protein: 6.7 g/dL (ref 6.0–8.5)
eGFR: 78 mL/min/{1.73_m2} (ref >59)

## 2022-10-24 LAB — CBC WITH DIFFERENTIAL/PLATELET
Basophils Absolute: 0 10*3/uL (ref 0.0–0.2)
Basos: 1 %
EOS (ABSOLUTE): 0.1 10*3/uL (ref 0.0–0.4)
Eos: 1 %
Hematocrit: 45.4 % (ref 37.5–51.0)
Hemoglobin: 14 g/dL (ref 13.0–17.7)
Immature Grans (Abs): 0 10*3/uL (ref 0.0–0.1)
Immature Granulocytes: 0 %
Lymphocytes Absolute: 1.7 10*3/uL (ref 0.7–3.1)
Lymphs: 46 %
MCH: 27 pg (ref 26.6–33.0)
MCHC: 30.8 g/dL — ABNORMAL LOW (ref 31.5–35.7)
MCV: 88 fL (ref 79–97)
Monocytes Absolute: 0.6 10*3/uL (ref 0.1–0.9)
Monocytes: 18 %
Neutrophils Absolute: 1.2 10*3/uL — ABNORMAL LOW (ref 1.4–7.0)
Neutrophils: 34 %
Platelets: 219 10*3/uL (ref 150–450)
RBC: 5.18 x10E6/uL (ref 4.14–5.80)
RDW: 14.3 % (ref 11.6–15.4)
WBC: 3.6 10*3/uL (ref 3.4–10.8)

## 2022-10-24 LAB — LIPID PANEL
Chol/HDL Ratio: 2.1 ratio (ref 0.0–5.0)
Cholesterol, Total: 101 mg/dL (ref 100–199)
HDL: 48 mg/dL (ref >39)
LDL Chol Calc (NIH): 38 mg/dL (ref 0–99)
Triglycerides: 69 mg/dL (ref 0–149)
VLDL Cholesterol Cal: 15 mg/dL (ref 5–40)

## 2022-10-24 LAB — BAYER DCA HB A1C WAIVED: HB A1C (BAYER DCA - WAIVED): 6.9 % — ABNORMAL HIGH (ref 4.8–5.6)

## 2022-10-26 ENCOUNTER — Encounter: Payer: Self-pay | Admitting: Family Medicine

## 2022-10-26 MED ORDER — ROSUVASTATIN CALCIUM 10 MG PO TABS: ORAL_TABLET | ORAL | 3 refills | Status: DC

## 2022-10-26 MED ORDER — NABUMETONE 500 MG PO TABS: 1000.0000 mg | ORAL_TABLET | Freq: Two times a day (BID) | ORAL | 3 refills | Status: AC

## 2022-10-26 MED ORDER — AMLODIPINE BESYLATE 10 MG PO TABS
10.0000 mg | ORAL_TABLET | Freq: Every day | ORAL | 3 refills | Status: DC
Start: 2022-10-26 — End: 2023-10-12

## 2022-10-26 NOTE — Progress Notes (Signed)
Hello Devin Tran,  Your lab result is normal and/or stable.Some minor variations that are not significant are commonly marked abnormal, but do not represent any medical problem for you.  Best regards, Mechele Claude, M.D.

## 2022-12-04 ENCOUNTER — Inpatient Hospital Stay: Payer: 59 | Attending: Hematology

## 2022-12-04 ENCOUNTER — Other Ambulatory Visit: Payer: 59

## 2022-12-04 DIAGNOSIS — C8308 Small cell B-cell lymphoma, lymph nodes of multiple sites: Secondary | ICD-10-CM | POA: Diagnosis present

## 2022-12-04 DIAGNOSIS — C8108 Nodular lymphocyte predominant Hodgkin lymphoma, lymph nodes of multiple sites: Secondary | ICD-10-CM

## 2022-12-04 DIAGNOSIS — E119 Type 2 diabetes mellitus without complications: Secondary | ICD-10-CM | POA: Diagnosis not present

## 2022-12-04 LAB — CBC WITH DIFFERENTIAL/PLATELET
Abs Immature Granulocytes: 0.01 10*3/uL (ref 0.00–0.07)
Basophils Absolute: 0 10*3/uL (ref 0.0–0.1)
Basophils Relative: 1 %
Eosinophils Absolute: 0.1 10*3/uL (ref 0.0–0.5)
Eosinophils Relative: 2 %
HCT: 42.6 % (ref 39.0–52.0)
Hemoglobin: 13.9 g/dL (ref 13.0–17.0)
Immature Granulocytes: 0 %
Lymphocytes Relative: 42 %
Lymphs Abs: 1.5 10*3/uL (ref 0.7–4.0)
MCH: 27.5 pg (ref 26.0–34.0)
MCHC: 32.6 g/dL (ref 30.0–36.0)
MCV: 84.4 fL (ref 80.0–100.0)
Monocytes Absolute: 0.6 10*3/uL (ref 0.1–1.0)
Monocytes Relative: 17 %
Neutro Abs: 1.3 10*3/uL — ABNORMAL LOW (ref 1.7–7.7)
Neutrophils Relative %: 38 %
Platelets: 205 10*3/uL (ref 150–400)
RBC: 5.05 MIL/uL (ref 4.22–5.81)
RDW: 14.8 % (ref 11.5–15.5)
WBC: 3.4 10*3/uL — ABNORMAL LOW (ref 4.0–10.5)
nRBC: 0 % (ref 0.0–0.2)

## 2022-12-04 LAB — COMPREHENSIVE METABOLIC PANEL
ALT: 23 U/L (ref 0–44)
AST: 22 U/L (ref 15–41)
Albumin: 4.5 g/dL (ref 3.5–5.0)
Alkaline Phosphatase: 57 U/L (ref 38–126)
Anion gap: 11 (ref 5–15)
BUN: 15 mg/dL (ref 6–20)
CO2: 24 mmol/L (ref 22–32)
Calcium: 9.1 mg/dL (ref 8.9–10.3)
Chloride: 104 mmol/L (ref 98–111)
Creatinine, Ser: 1.13 mg/dL (ref 0.61–1.24)
GFR, Estimated: >60 mL/min (ref >60)
Glucose, Bld: 123 mg/dL — ABNORMAL HIGH (ref 70–99)
Potassium: 3.6 mmol/L (ref 3.5–5.1)
Sodium: 139 mmol/L (ref 135–145)
Total Bilirubin: 0.6 mg/dL (ref 0.3–1.2)
Total Protein: 6.7 g/dL (ref 6.5–8.1)

## 2022-12-04 LAB — SEDIMENTATION RATE: Sed Rate: 0 mm/h (ref 0–16)

## 2022-12-04 LAB — LACTATE DEHYDROGENASE: LDH: 153 U/L (ref 98–192)

## 2022-12-11 ENCOUNTER — Inpatient Hospital Stay: Payer: 59 | Attending: Hematology | Admitting: Hematology

## 2022-12-11 ENCOUNTER — Ambulatory Visit: Payer: 59 | Admitting: Hematology

## 2022-12-11 VITALS — BP 126/95 | HR 68 | Temp 98.4°F | Resp 16 | Wt 230.7 lb

## 2022-12-11 DIAGNOSIS — C8108 Nodular lymphocyte predominant Hodgkin lymphoma, lymph nodes of multiple sites: Secondary | ICD-10-CM | POA: Diagnosis not present

## 2022-12-11 DIAGNOSIS — C8199 Hodgkin lymphoma, unspecified, extranodal and solid organ sites: Secondary | ICD-10-CM | POA: Insufficient documentation

## 2022-12-11 NOTE — Progress Notes (Signed)
[facility] [address] [place] [address]    Clinic Day:  12/11/2022  Referring physician: Mechele Claude, MD  Patient Care Team: Mechele Claude, MD as PCP - General (Family Medicine) Doreatha Massed, MD as Medical Oncologist (Medical Oncology)   ASSESSMENT & PLAN:   Assessment: 1.  NLP Hodgkin's lymphoma, recurrent 3B: -6 cycles of R-CHOP from 05/24/2018 through 09/14/2018. -XRT to the right axillary lymph node completed around November 2020. -PET scan on 03/21/2019 showed right axillary and subpectoral lymph nodes with the largest right axillary lymph node measuring 1.7 cm, [place] 2. -CT CAP on 09/19/2019 showed patchy bilateral groundglass opacities.  Mildly enlarged right axillary nodes largest measuring 13 x 26 mm unchanged since September 2020.  Subpectoral lymph nodes in the right chest and high right axillary lymph nodes without change.  No adenopathy in the abdomen or pelvis.   2.  Diabetes: -He is taking glipizide XR 5 mg daily. -We will check hemoglobin A1c next visit.   3.  Post splenectomy state: -Received Menactra and Pneumovax on 05/25/2018.   Plan: 1.  NLP Hodgkin's lymphoma, recurrent 3B: - He does not have any B symptoms or recurrent infections. - Physical exam: No palpable adenopathy or splenomegaly. - Reviewed labs from 12/04/2022: Normal LFTs and LDH.  Creatinine was normal.  CBC was grossly normal with mild leukopenia and neutropenia. - No evidence of recurrence at this time clinically. - RTC 6 months for follow-up.  Will plan on repeating CT CAP and labs prior to next visit.  That will be his last imaging.    Orders Placed This Encounter  Procedures   CT CHEST ABDOMEN PELVIS W CONTRAST    Standing Status:   Future    Standing Expiration Date:   12/11/2023    Order Specific Question:   If indicated for the ordered procedure, I authorize the administration of contrast media per Radiology protocol    Answer:   Yes    Order  Specific Question:   Does the patient have a contrast media/X-ray dye allergy?    Answer:   No    Order Specific Question:   Preferred imaging location?    Answer:   [facility]    Order Specific Question:   If indicated for the ordered procedure, I authorize the administration of oral contrast media per Radiology protocol    Answer:   Yes   CBC with Differential    Standing Status:   Future    Standing Expiration Date:   12/11/2023   Comprehensive metabolic panel    Standing Status:   Future    Standing Expiration Date:   12/11/2023   Lactate dehydrogenase    Standing Status:   Future    Standing Expiration Date:   12/11/2023      Devin Tran,acting as a scribe for Doreatha Massed, MD.,have documented all relevant documentation on the behalf of Doreatha Massed, MD,as directed by  Doreatha Massed, MD while in the presence of Doreatha Massed, MD.  I, Doreatha Massed MD, have reviewed the above documentation for accuracy and completeness, and I agree with the above.    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    Prior Therapy: 1. R-CHOP x 6 cycles from 05/24/2018 to 09/14/2018. 2. XRT to right axillary lymph nodes around 12/2018.  Current Therapy:  surveillance   HISTORY OF PRESENT ILLNESS:   Oncology History  Nodular lymphocyte predom Hodgkin lymphoma lymph nodes multiple sites (HCC)  05/25/2018 Initial Diagnosis   Nodular lymphocyte predom Hodgkin lymphoma lymph nodes multiple sites [facility])   06/01/2018 - 09/14/2018 Chemotherapy   Patient is on Treatment Plan : NON-HODGKINS LYMPHOMA R-CHOP q21d        INTERVAL HISTORY:   Devin Tran is a 57 y.o. male presenting to clinic today for follow up of stage IV Hodgkin lymphoma. He was last seen by me on 06/05/22.  Today, he states  that he is doing well overall. His appetite level is at 100%. His energy level is at 100%. His blood pressure when he had an appointment with his PCP was normal. He denies any fevers, night sweats, weight loss, infections, tingling, or numbness.   PAST MEDICAL HISTORY:   Past Medical History: Past Medical History:  Diagnosis Date   Complication of anesthesia    PONV per anesth notes   Erectile dysfunction    Hodgkin's lymphoma (HCC)    Hyperlipidemia    Hypertension    Infertility    Low testosterone    PONV (postoperative nausea and vomiting)     Surgical History: Past Surgical History:  Procedure Laterality Date   AXILLARY LYMPH NODE BIOPSY Right 05/10/2018   Procedure: AXILLARY LYMPH NODE BIOPSY, RIGHT;  Surgeon: Franky Macho, MD;  Location: [facility];  Service: General;  Laterality: Right;   IR LYMPHANGIOGRAM EXTREMITY UNI LEFT  03/02/1987   PORT-A-CATH REMOVAL Left 06/24/2021   Procedure: MINOR REMOVAL PORT-A-CATH;  Surgeon: Franky Macho, MD;  Location: [facility];  Service: General;  Laterality: Left;   PORTA CATH INSERTION     PORTA CATH REMOVAL     PORTACATH PLACEMENT Left 05/21/2018   Procedure: INSERTION PORT-A-CATH (attached catheter in left subclavian);  Surgeon: Franky Macho, MD;  Location: [facility];  Service: General;  Laterality: Left;   SPLENECTOMY Left Over 20 years ago   Due to Hodgkin's lymphoma    Social History: Social History   Socioeconomic History   Marital status: Single    Spouse name: Not on file   Number of children: Not on file   Years of education: Not on file   Highest education level: 12th grade  Occupational History   Occupation: Drive standup Orthoptist: [employer]  Tobacco Use   Smoking status: Never   Smokeless tobacco: Never  Vaping Use   Vaping status: Not on file  Substance and Sexual Activity   Alcohol use: No   Drug use: No   Sexual activity: Yes  Other Topics Concern   Not on file  Social History Narrative   Not on  file   Social Determinants of Health   Financial Resource Strain: Patient Declined (07/06/2022)   Overall Financial Resource Strain (CARDIA)    Difficulty of Paying Living Expenses: Patient declined  Food Insecurity: Patient Declined (07/06/2022)   Hunger Vital Sign    Worried About Running Out of Food in the Last Year: Patient declined    Ran Out of Food in the Last Year: Patient declined  Transportation Needs: No Transportation Needs (07/06/2022)   PRAPARE - Administrator, Civil Service (Medical): No    Lack of Transportation (Non-Medical): No  Physical Activity: Sufficiently Active (07/06/2022)   Exercise Vital Sign    Days of Exercise per Week: 3 days    Minutes of Exercise per Session: 150+ min  Stress: Not on file  Social Connections: Moderately Integrated (07/06/2022)   Social Connection and Isolation Panel [NHANES]    Frequency of Communication with Friends and Family: More than three times a week    Frequency of Social Gatherings with Friends and Family: Once a week    Attends Religious Services: More than 4 times per year    Active Member of [employer] or Organizations: No    Attends Banker Meetings: Not on file    Marital Status: Living with partner  Intimate Partner Violence: Not At Risk (04/02/2020)   Humiliation, Afraid, Rape, and Kick questionnaire    Fear of Current or Ex-Partner: No    Emotionally Abused: No    Physically Abused: No    Sexually Abused: No    Family History: Family History  Problem Relation Age of Onset   Hypertension Mother    Cancer Father        lung   Hypertension Father    Diabetes Father    COPD Father    Hypertension Brother    Stroke Brother    Cancer Paternal Uncle        possibly throat cancer   Colon cancer Neg Hx    Esophageal cancer Neg Hx    Rectal cancer Neg Hx    Stomach cancer Neg Hx     Current Medications:  Current Outpatient Medications:    ACCU-CHEK GUIDE test strip, CHECK SUGAR UP TO 4 TIMES  DAILY DX E11.9, Disp: 400 strip, Rfl: 3   Accu-Chek Softclix Lancets lancets, SMARTSIG:2 Topical Twice Daily PRN, Disp: , Rfl:    amLODipine (NORVASC) 10 MG tablet, Take 1 tablet (10 mg total) by mouth daily., Disp: 90 tablet, Rfl: 3   Ascorbic Acid (VITAMIN C) 1000 MG tablet, Take 1,000 mg by mouth daily. Vitamin c lozenge, Disp: , Rfl:    metFORMIN (GLUCOPHAGE-XR) 750 MG 24 hr tablet, Take 1 tablet (750 mg total) by mouth 2 (two) times daily before a meal., Disp: 180 tablet, Rfl: 3   Multiple Vitamins-Minerals (ONE-A-DAY MENS HEALTH FORMULA PO), Take 1 tablet by mouth daily. , Disp: , Rfl:    nabumetone (RELAFEN) 500 MG tablet, Take 2 tablets (1,000 mg total) by mouth 2 (two) times daily. For muscle and joint pain, Disp: 360 tablet, Rfl: 3   rosuvastatin (CRESTOR) 10 MG tablet, TAKE 1 TABLET BY MOUTH EVERY DAY FOR CHOLESTEROL, Disp: 90 tablet, Rfl: 3   Allergies: Allergies  Allergen Reactions   Bleomycin Other (See Comments)    Cramping all over    REVIEW OF SYSTEMS:   Review of Systems  Constitutional:  Negative for chills, fatigue and fever.  HENT:   Positive for trouble swallowing. Negative for lump/mass, mouth sores, nosebleeds and sore throat.   Eyes:  Negative for eye problems.  Respiratory:  Negative for cough and shortness of breath.   Cardiovascular:  Negative for chest pain, leg swelling and palpitations.  Gastrointestinal:  Negative for abdominal pain, constipation, diarrhea, nausea and vomiting.  Genitourinary:  Negative for bladder incontinence, difficulty urinating, dysuria, frequency, hematuria and nocturia.   Musculoskeletal:  Negative for arthralgias, back pain, flank pain, myalgias and neck pain.  Skin:  Negative for itching and rash.  Neurological:  Negative for dizziness, headaches and numbness.  Hematological:  Does not bruise/bleed easily.  Psychiatric/Behavioral:  Negative for depression, sleep disturbance and suicidal ideas. The patient is not nervous/anxious.    All other systems reviewed and are negative.    VITALS:  Blood pressure (!) 126/95, pulse 68, temperature 98.4 F (36.9 C), temperature source Oral, resp. rate 16, weight 230 lb 11.2 oz (104.6 kg), SpO2 97%.  Wt Readings from Last 3 Encounters:  12/11/22 230 lb 11.2 oz (104.6 kg)  10/23/22 231 lb 9.6 oz (105.1 kg)  07/10/22 232 lb (105.2 kg)    Body mass index is 30.44 kg/m.  Performance status (ECOG): 0 - Asymptomatic  PHYSICAL EXAM:   Physical Exam Vitals and nursing note reviewed. Exam conducted with a chaperone present.  Constitutional:      Appearance: Normal appearance.  Cardiovascular:     Rate and Rhythm: Normal rate and regular rhythm.     Pulses: Normal pulses.     Heart sounds: Normal heart sounds.  Pulmonary:     Effort: Pulmonary effort is normal.     Breath sounds: Normal breath sounds.  Abdominal:     Palpations: Abdomen is soft. There is no hepatomegaly, splenomegaly or mass.     Tenderness: There is no abdominal tenderness.  Musculoskeletal:     Right lower leg: No edema.     Left lower leg: No edema.  Lymphadenopathy:     Cervical: No cervical adenopathy.     Right cervical: No superficial, deep or posterior cervical adenopathy.    Left cervical: No superficial, deep or posterior cervical adenopathy.     Upper Body:     Right upper body: No supraclavicular or axillary adenopathy.     Left upper body: No supraclavicular or axillary adenopathy.  Neurological:     General: No focal deficit present.     Mental Status: He is alert and oriented to person, place, and time.  Psychiatric:        Mood and Affect: Mood normal.        Behavior: Behavior normal.     LABS:      Latest Ref Rng & Units 12/04/2022    2:36 PM 10/23/2022    4:36 PM 07/10/2022    4:01 PM  CBC  WBC 4.0 - 10.5 K/uL 3.4  3.6  3.8   Hemoglobin 13.0 - 17.0 g/dL 40.9  81.1  91.4   Hematocrit 39.0 - 52.0 % 42.6  45.4  43.0   Platelets 150 - 400 K/uL 205  219  202       Latest  Ref Rng & Units 12/04/2022    2:36 PM 10/23/2022    4:36 PM 07/10/2022    4:01 PM  CMP  Glucose 70 - 99 mg/dL 782  84  83   BUN 6 - 20 mg/dL 15  18  19    Creatinine 0.61 - 1.24 mg/dL 9.56  2.13  0.86   Sodium 135 - 145 mmol/L 139  145  142   Potassium 3.5 - 5.1 mmol/L 3.6  4.1  4.0   Chloride 98 - 111 mmol/L 104  106  104   CO2 22 - 32 mmol/L 24  21  25    Calcium 8.9 - 10.3 mg/dL 9.1  9.6  9.5   Total Protein 6.5 - 8.1 g/dL 6.7  6.7  6.8   Total Bilirubin 0.3 - 1.2 mg/dL 0.6  0.4  0.4   Alkaline Phos 38 - 126 U/L 57  73  75   AST 15 - 41 U/L 22  23  21    ALT 0 - 44 U/L 23  23  24       No results found for: "CEA1", "CEA" / No results found for: "  CEA1", "CEA" Lab Results  Component Value Date   PSA1 0.4 09/30/2021   No results found for: "EXB284" No results found for: "CAN125"  No results found for: "TOTALPROTELP", "ALBUMINELP", "A1GS", "A2GS", "BETS", "BETA2SER", "GAMS", "MSPIKE", "SPEI" No results found for: "TIBC", "FERRITIN", "IRONPCTSAT" Lab Results  Component Value Date   LDH 153 12/04/2022   LDH 164 05/29/2022   LDH 153 05/23/2021     STUDIES:   No results found.

## 2022-12-11 NOTE — Patient Instructions (Addendum)
[facility] at [facility] Discharge Instructions   You were seen and examined today by Dr. Ellin Saba.  He reviewed the results of your lab work which was normal.   We will see you back in 6 months. We will repeat lab work and a CT scan prior to this visit.   Return as scheduled.    Thank you for choosing [facility] at [facility] to provide your oncology and hematology care.  To afford each patient quality time with our provider, please arrive at least 15 minutes before your scheduled appointment time.   If you have a lab appointment with the Cancer Center please come in thru the Main Entrance and check in at the main information desk.  You need to re-schedule your appointment should you arrive 10 or more minutes late.  We strive to give you quality time with our providers, and arriving late affects you and other patients whose appointments are after yours.  Also, if you no show three or more times for appointments you may be dismissed from the clinic at the providers discretion.     Again, thank you for choosing [facility].  Our hope is that these requests will decrease the amount of time that you wait before being seen by our physicians.       _____________________________________________________________  Should you have questions after your visit to [facility], please contact our office at [phone] and follow the prompts.  Our office hours are 8:00 a.m. and 4:30 p.m. Monday - Friday.  Please note that voicemails left after 4:00 p.m. may not be returned until the following business day.  We are closed weekends and major holidays.  You do have access to a nurse 24-7, just call the main number to the clinic [phone] and do not press any options, hold on the line and a nurse will answer the phone.    For prescription refill requests, have your pharmacy contact our office and allow 72 hours.    Due to Covid,  you will need to wear a mask upon entering the hospital. If you do not have a mask, a mask will be given to you at the Main Entrance upon arrival. For doctor visits, patients may have 1 support person age 75 or older with them. For treatment visits, patients can not have anyone with them due to social distancing guidelines and our immunocompromised population.

## 2023-02-02 ENCOUNTER — Encounter: Payer: Self-pay | Admitting: Family Medicine

## 2023-02-02 ENCOUNTER — Ambulatory Visit (INDEPENDENT_AMBULATORY_CARE_PROVIDER_SITE_OTHER): Payer: 59 | Admitting: Family Medicine

## 2023-02-02 VITALS — BP 120/73 | HR 76 | Ht 73.0 in | Wt 228.8 lb

## 2023-02-02 DIAGNOSIS — Z23 Encounter for immunization: Secondary | ICD-10-CM

## 2023-02-02 DIAGNOSIS — Z7984 Long term (current) use of oral hypoglycemic drugs: Secondary | ICD-10-CM

## 2023-02-02 DIAGNOSIS — E782 Mixed hyperlipidemia: Secondary | ICD-10-CM

## 2023-02-02 DIAGNOSIS — E1169 Type 2 diabetes mellitus with other specified complication: Secondary | ICD-10-CM

## 2023-02-02 DIAGNOSIS — I10 Essential (primary) hypertension: Secondary | ICD-10-CM | POA: Diagnosis not present

## 2023-02-02 DIAGNOSIS — E119 Type 2 diabetes mellitus without complications: Secondary | ICD-10-CM

## 2023-02-02 LAB — BAYER DCA HB A1C WAIVED: HB A1C (BAYER DCA - WAIVED): 7.4 % — ABNORMAL HIGH (ref 4.8–5.6)

## 2023-02-02 NOTE — Addendum Note (Signed)
Addended by: Diamantina Monks on: 02/02/2023 04:33 PM   Modules accepted: Orders

## 2023-02-02 NOTE — Progress Notes (Signed)
Subjective:  Patient ID: Devin Tran, male    DOB: January 20, 1966  Age: 57 y.o. MRN: [id number]  CC: Medical Management of Chronic Issues   HPI Devin Tran presents for  presents for  follow-up of hypertension. Patient has no history of headache chest pain or shortness of breath or recent cough. Patient also denies symptoms of TIA such as focal numbness or weakness. Patient denies side effects from medication. States taking it regularly.   presents forFollow-up of diabetes. Patient checks blood sugar at home.   132 fasting and 100 postprandial. Sometimes lower, below 100, Some up to 140-170.  Patient denies symptoms such as polyuria, polydipsia, excessive hunger, nausea No significant hypoglycemic spells noted. Medications reviewed. Pt reports taking them regularly without complication/adverse reaction being reported today.  Lab Results  Component Value Date   HGBA1C 6.9 (H) 10/23/2022   HGBA1C 7.0 (H) 07/10/2022   HGBA1C 7.6 (H) 04/07/2022         02/02/2023    3:55 PM 02/02/2023    3:49 PM 10/23/2022    4:12 PM  Depression screen PHQ 2/9  Decreased Interest 0 0 0  Down, Depressed, Hopeless 0 0 0  PHQ - 2 Score 0 0 0    History Devin Tran has a past medical history of Complication of anesthesia, Erectile dysfunction, Hodgkin's lymphoma (HCC), Hyperlipidemia, Hypertension, Infertility, Low testosterone, and PONV (postoperative nausea and vomiting).   He has a past surgical history that includes Splenectomy (Left, Over 20 years ago); PORTA CATH INSERTION; PORTA CATH REMOVAL; Axillary lymph node biopsy (Right, 05/10/2018); Portacath placement (Left, 05/21/2018); IR LYMPHANGIOGRAM EXTREMITY UNI LEFT (03/02/1987); and Port-a-cath removal (Left, 06/24/2021).   His family history includes COPD in his father; Cancer in his father and paternal uncle; Diabetes in his father; Hypertension in his brother, father, and mother; Stroke in his brother.He reports that he has never smoked. He has never  used smokeless tobacco. He reports that he does not drink alcohol and does not use drugs.    ROS Review of Systems  Constitutional:  Negative for fever.  Respiratory:  Negative for shortness of breath.   Cardiovascular:  Negative for chest pain.  Musculoskeletal:  Negative for arthralgias.  Skin:  Negative for rash.    Objective:  BP 120/73   Pulse 76   Ht 6\' 1"  (1.854 m)   Wt 228 lb 12.8 oz (103.8 kg)   SpO2 97%   BMI 30.19 kg/m   BP Readings from Last 3 Encounters:  02/02/23 120/73  12/11/22 (!) 126/95  10/23/22 120/73    Wt Readings from Last 3 Encounters:  02/02/23 228 lb 12.8 oz (103.8 kg)  12/11/22 230 lb 11.2 oz (104.6 kg)  10/23/22 231 lb 9.6 oz (105.1 kg)     Physical Exam Vitals reviewed.  Constitutional:      Appearance: He is well-developed.  HENT:     Head: Normocephalic and atraumatic.     Right Ear: External ear normal.     Left Ear: External ear normal.     Mouth/Throat:     Pharynx: No oropharyngeal exudate or posterior oropharyngeal erythema.  Eyes:     Pupils: Pupils are equal, round, and reactive to light.  Cardiovascular:     Rate and Rhythm: Normal rate and regular rhythm.     Heart sounds: No murmur heard. Pulmonary:     Effort: No respiratory distress.     Breath sounds: Normal breath sounds.  Musculoskeletal:     Cervical back: Normal  range of motion and neck supple.  Neurological:     Mental Status: He is alert and oriented to person, place, and time.       Assessment & Plan:   Devin Tran was seen today for medical management of chronic issues.  Diagnoses and all orders for this visit:  Primary hypertension -     CBC with Differential/Platelet -     CMP14+EGFR  Mixed hyperlipidemia -     Lipid panel  Type 2 diabetes mellitus without complication, without long-term current use of insulin (HCC) -     Bayer DCA Hb A1c Waived       I am having Devin Tran maintain his Multiple Vitamins-Minerals (ONE-A-DAY MENS  HEALTH FORMULA PO), vitamin C, Accu-Chek Softclix Lancets, Accu-Chek Guide, metFORMIN, amLODipine, nabumetone, and rosuvastatin.  Allergies as of 02/02/2023       Reactions   Bleomycin Other (See Comments)   Cramping all over        Medication List        Accurate as of February 02, 2023  4:25 PM. If you have any questions, ask your nurse or doctor.          Accu-Chek Guide test strip Generic drug: glucose blood CHECK SUGAR UP TO 4 TIMES DAILY DX E11.9   Accu-Chek Softclix Lancets lancets SMARTSIG:2 Topical Twice Daily PRN   amLODipine 10 MG tablet Commonly known as: NORVASC Take 1 tablet (10 mg total) by mouth daily.   metFORMIN 750 MG 24 hr tablet Commonly known as: GLUCOPHAGE-XR Take 1 tablet (750 mg total) by mouth 2 (two) times daily before a meal.   nabumetone 500 MG tablet Commonly known as: RELAFEN Take 2 tablets (1,000 mg total) by mouth 2 (two) times daily. For muscle and joint pain   ONE-A-DAY MENS HEALTH FORMULA PO Take 1 tablet by mouth daily.   rosuvastatin 10 MG tablet Commonly known as: CRESTOR TAKE 1 TABLET BY MOUTH EVERY DAY FOR CHOLESTEROL   vitamin C 1000 MG tablet Take 1,000 mg by mouth daily. Vitamin c lozenge       Discussed inconsistencies in Diet and exercise. He will work on those for the next 3 months to bring A1c back down below 7.0  Follow-up: Return in about 3 months (around 05/03/2023).  Mechele Claude, M.D.

## 2023-02-03 LAB — CBC WITH DIFFERENTIAL/PLATELET
Basophils Absolute: 0 10*3/uL (ref 0.0–0.2)
Basos: 1 %
EOS (ABSOLUTE): 0.1 10*3/uL (ref 0.0–0.4)
Eos: 2 %
Hematocrit: 47.6 % (ref 37.5–51.0)
Hemoglobin: 15.7 g/dL (ref 13.0–17.7)
Immature Grans (Abs): 0 10*3/uL (ref 0.0–0.1)
Immature Granulocytes: 0 %
Lymphocytes Absolute: 1.7 10*3/uL (ref 0.7–3.1)
Lymphs: 44 %
MCH: 27.8 pg (ref 26.6–33.0)
MCHC: 33 g/dL (ref 31.5–35.7)
MCV: 84 fL (ref 79–97)
Monocytes Absolute: 0.5 10*3/uL (ref 0.1–0.9)
Monocytes: 14 %
Neutrophils Absolute: 1.4 10*3/uL (ref 1.4–7.0)
Neutrophils: 39 %
Platelets: 237 10*3/uL (ref 150–450)
RBC: 5.64 x10E6/uL (ref 4.14–5.80)
RDW: 14.2 % (ref 11.6–15.4)
WBC: 3.7 10*3/uL (ref 3.4–10.8)

## 2023-02-03 LAB — LIPID PANEL
Chol/HDL Ratio: 2.8 {ratio} (ref 0.0–5.0)
Cholesterol, Total: 119 mg/dL (ref 100–199)
HDL: 43 mg/dL (ref >39)
LDL Chol Calc (NIH): 58 mg/dL (ref 0–99)
Triglycerides: 97 mg/dL (ref 0–149)
VLDL Cholesterol Cal: 18 mg/dL (ref 5–40)

## 2023-02-03 LAB — CMP14+EGFR
ALT: 26 [IU]/L (ref 0–44)
AST: 24 [IU]/L (ref 0–40)
Albumin: 4.8 g/dL (ref 3.8–4.9)
Alkaline Phosphatase: 82 [IU]/L (ref 44–121)
BUN/Creatinine Ratio: 12 (ref 9–20)
BUN: 13 mg/dL (ref 6–24)
Bilirubin Total: 0.3 mg/dL (ref 0.0–1.2)
CO2: 22 mmol/L (ref 20–29)
Calcium: 9.8 mg/dL (ref 8.7–10.2)
Chloride: 104 mmol/L (ref 96–106)
Creatinine, Ser: 1.11 mg/dL (ref 0.76–1.27)
Globulin, Total: 1.8 g/dL (ref 1.5–4.5)
Glucose: 133 mg/dL — ABNORMAL HIGH (ref 70–99)
Potassium: 3.9 mmol/L (ref 3.5–5.2)
Sodium: 142 mmol/L (ref 134–144)
Total Protein: 6.6 g/dL (ref 6.0–8.5)
eGFR: 78 mL/min/{1.73_m2} (ref >59)

## 2023-04-15 ENCOUNTER — Ambulatory Visit: Payer: 59 | Admitting: Family Medicine

## 2023-04-16 ENCOUNTER — Encounter: Payer: Self-pay | Admitting: Family Medicine

## 2023-04-16 ENCOUNTER — Ambulatory Visit (INDEPENDENT_AMBULATORY_CARE_PROVIDER_SITE_OTHER): Admitting: Family Medicine

## 2023-04-16 VITALS — BP 120/74 | HR 70 | Temp 98.5°F | Ht 73.0 in | Wt 230.0 lb

## 2023-04-16 DIAGNOSIS — I10 Essential (primary) hypertension: Secondary | ICD-10-CM

## 2023-04-16 DIAGNOSIS — Z23 Encounter for immunization: Secondary | ICD-10-CM | POA: Diagnosis not present

## 2023-04-16 DIAGNOSIS — E119 Type 2 diabetes mellitus without complications: Secondary | ICD-10-CM

## 2023-04-16 DIAGNOSIS — Z7984 Long term (current) use of oral hypoglycemic drugs: Secondary | ICD-10-CM

## 2023-04-16 DIAGNOSIS — E782 Mixed hyperlipidemia: Secondary | ICD-10-CM | POA: Diagnosis not present

## 2023-04-16 DIAGNOSIS — E1169 Type 2 diabetes mellitus with other specified complication: Secondary | ICD-10-CM | POA: Diagnosis not present

## 2023-04-16 LAB — BAYER DCA HB A1C WAIVED: HB A1C (BAYER DCA - WAIVED): 7.3 % — ABNORMAL HIGH (ref 4.8–5.6)

## 2023-04-16 NOTE — Progress Notes (Signed)
 Subjective:  Patient ID: Devin Tran,  male    DOB: 30-Sep-1965  Age: 58 y.o.    CC: No chief complaint on file.   HPI Devin Tran presents for  follow-up of hypertension. Patient has no history of headache chest pain or shortness of breath or recent cough. Patient also denies symptoms of TIA such as numbness weakness lateralizing. Patient denies side effects from medication. States taking it regularly.  Patient also  in for follow-up of elevated cholesterol. Doing well without complaints on current medication. Denies side effects  including myalgia and arthralgia and nausea. Also in today for liver function testing. Currently no chest pain, shortness of breath or other cardiovascular related symptoms noted.  Follow-up of diabetes. Patient does check blood sugar at home. Readings run between 140s fasting and 80-130 postprandial Patient denies symptoms such as excessive hunger or urinary frequency, excessive hunger, nausea No significant hypoglycemic spells noted. Medications reviewed. Pt reports taking them regularly. Pt. denies complication/adverse reaction today.    History Devin Tran has a past medical history of Complication of anesthesia, Erectile dysfunction, Hodgkin's lymphoma (HCC), Hyperlipidemia, Hypertension, Infertility, Low testosterone, and PONV (postoperative nausea and vomiting).   Devin Tran has a past surgical history that includes Splenectomy (Left, Over 20 years ago); PORTA CATH INSERTION; PORTA CATH REMOVAL; Axillary lymph node biopsy (Right, 05/10/2018); Portacath placement (Left, 05/21/2018); IR LYMPHANGIOGRAM EXTREMITY UNI LEFT (03/02/1987); and Port-a-cath removal (Left, 06/24/2021).   His family history includes COPD in his father; Cancer in his father and paternal uncle; Diabetes in his father; Hypertension in his brother, father, and mother; Stroke in his brother.Devin Tran reports that Devin Tran has never smoked. Devin Tran has never used smokeless tobacco. Devin Tran reports that Devin Tran does not drink alcohol  and does not use drugs.  Current Outpatient Medications on File Prior to Visit  Medication Sig Dispense Refill   ACCU-CHEK GUIDE test strip CHECK SUGAR UP TO 4 TIMES DAILY DX E11.9 400 strip 3   Accu-Chek Softclix Lancets lancets SMARTSIG:2 Topical Twice Daily PRN     amLODipine (NORVASC) 10 MG tablet Take 1 tablet (10 mg total) by mouth daily. 90 tablet 3   Ascorbic Acid (VITAMIN C) 1000 MG tablet Take 1,000 mg by mouth daily. Vitamin c lozenge     metFORMIN (GLUCOPHAGE-XR) 750 MG 24 hr tablet Take 1 tablet (750 mg total) by mouth 2 (two) times daily before a meal. 180 tablet 3   Multiple Vitamins-Minerals (ONE-A-DAY MENS HEALTH FORMULA PO) Take 1 tablet by mouth daily.      nabumetone (RELAFEN) 500 MG tablet Take 2 tablets (1,000 mg total) by mouth 2 (two) times daily. For muscle and joint pain 360 tablet 3   rosuvastatin (CRESTOR) 10 MG tablet TAKE 1 TABLET BY MOUTH EVERY DAY FOR CHOLESTEROL 90 tablet 3   No current facility-administered medications on file prior to visit.    ROS Review of Systems  Constitutional: Negative.   HENT: Negative.    Eyes:  Negative for visual disturbance.  Respiratory:  Negative for cough and shortness of breath.   Cardiovascular:  Negative for chest pain and leg swelling.  Gastrointestinal:  Negative for abdominal pain, diarrhea, nausea and vomiting.  Genitourinary:  Negative for difficulty urinating.  Musculoskeletal:  Negative for arthralgias and myalgias.  Skin:  Negative for rash.  Neurological:  Negative for headaches.  Psychiatric/Behavioral:  Negative for sleep disturbance.     Objective:  BP 120/74   Pulse 70   Temp 98.5 F (36.9 C)   Ht  6\' 1"  (1.854 m)   Wt 230 lb (104.3 kg)   SpO2 94%   BMI 30.34 kg/m   BP Readings from Last 3 Encounters:  04/16/23 120/74  02/02/23 120/73  12/11/22 (!) 126/95    Wt Readings from Last 3 Encounters:  04/16/23 230 lb (104.3 kg)  02/02/23 228 lb 12.8 oz (103.8 kg)  12/11/22 230 lb 11.2 oz  (104.6 kg)     Physical Exam Vitals reviewed.  Constitutional:      Appearance: Devin Tran is well-developed.  HENT:     Head: Normocephalic and atraumatic.     Right Ear: External ear normal.     Left Ear: External ear normal.     Mouth/Throat:     Pharynx: No oropharyngeal exudate or posterior oropharyngeal erythema.  Eyes:     Pupils: Pupils are equal, round, and reactive to light.  Cardiovascular:     Rate and Rhythm: Normal rate and regular rhythm.     Heart sounds: No murmur heard. Pulmonary:     Effort: No respiratory distress.     Breath sounds: Normal breath sounds.  Musculoskeletal:     Cervical back: Normal range of motion and neck supple.  Neurological:     Mental Status: Devin Tran is alert and oriented to person, place, and time.     Diabetic Foot Exam - Simple   No data filed     Lab Results  Component Value Date   HGBA1C 7.4 (H) 02/02/2023   HGBA1C 6.9 (H) 10/23/2022   HGBA1C 7.0 (H) 07/10/2022    Assessment & Plan:   Diagnoses and all orders for this visit:  Type 2 diabetes mellitus without complication, without long-term current use of insulin (HCC) -     Bayer DCA Hb A1c Waived  Mixed hyperlipidemia -     Lipid panel  Primary hypertension -     CBC with Differential/Platelet -     CMP14+EGFR  Immunization due -     Pneumococcal conjugate vaccine 20-valent (Prevnar 20)   I am having Devin Tran maintain his Multiple Vitamins-Minerals (ONE-A-DAY MENS HEALTH FORMULA PO), vitamin C, Accu-Chek Softclix Lancets, Accu-Chek Guide, metFORMIN, amLODipine, nabumetone, and rosuvastatin.  No orders of the defined types were placed in this encounter.    Follow-up: No follow-ups on file.  Mechele Claude, M.D.

## 2023-04-17 LAB — CBC WITH DIFFERENTIAL/PLATELET
Basophils Absolute: 0 10*3/uL (ref 0.0–0.2)
Basos: 1 %
EOS (ABSOLUTE): 0 10*3/uL (ref 0.0–0.4)
Eos: 1 %
Hematocrit: 46.8 % (ref 37.5–51.0)
Hemoglobin: 15.3 g/dL (ref 13.0–17.7)
Immature Grans (Abs): 0 10*3/uL (ref 0.0–0.1)
Immature Granulocytes: 0 %
Lymphocytes Absolute: 1.8 10*3/uL (ref 0.7–3.1)
Lymphs: 52 %
MCH: 28.2 pg (ref 26.6–33.0)
MCHC: 32.7 g/dL (ref 31.5–35.7)
MCV: 86 fL (ref 79–97)
Monocytes Absolute: 0.4 10*3/uL (ref 0.1–0.9)
Monocytes: 12 %
Neutrophils Absolute: 1.2 10*3/uL — ABNORMAL LOW (ref 1.4–7.0)
Neutrophils: 34 %
Platelets: 231 10*3/uL (ref 150–450)
RBC: 5.43 x10E6/uL (ref 4.14–5.80)
RDW: 14 % (ref 11.6–15.4)
WBC: 3.5 10*3/uL (ref 3.4–10.8)

## 2023-04-17 LAB — LIPID PANEL
Chol/HDL Ratio: 2.7 ratio (ref 0.0–5.0)
Cholesterol, Total: 113 mg/dL (ref 100–199)
HDL: 42 mg/dL (ref >39)
LDL Chol Calc (NIH): 57 mg/dL (ref 0–99)
Triglycerides: 64 mg/dL (ref 0–149)
VLDL Cholesterol Cal: 14 mg/dL (ref 5–40)

## 2023-04-17 LAB — CMP14+EGFR
ALT: 24 IU/L (ref 0–44)
AST: 23 IU/L (ref 0–40)
Albumin: 4.9 g/dL (ref 3.8–4.9)
Alkaline Phosphatase: 79 IU/L (ref 44–121)
BUN/Creatinine Ratio: 13 (ref 9–20)
BUN: 15 mg/dL (ref 6–24)
Bilirubin Total: 0.5 mg/dL (ref 0.0–1.2)
CO2: 22 mmol/L (ref 20–29)
Calcium: 9.6 mg/dL (ref 8.7–10.2)
Chloride: 104 mmol/L (ref 96–106)
Creatinine, Ser: 1.19 mg/dL (ref 0.76–1.27)
Globulin, Total: 1.8 g/dL (ref 1.5–4.5)
Glucose: 86 mg/dL (ref 70–99)
Potassium: 4.1 mmol/L (ref 3.5–5.2)
Sodium: 143 mmol/L (ref 134–144)
Total Protein: 6.7 g/dL (ref 6.0–8.5)
eGFR: 72 mL/min/{1.73_m2} (ref >59)

## 2023-04-18 ENCOUNTER — Encounter: Payer: Self-pay | Admitting: Family Medicine

## 2023-04-18 NOTE — Progress Notes (Signed)
Hello [place],  Your lab result is normal and/or stable.Some minor variations that are not significant are commonly marked abnormal, but do not represent any medical problem for you.  Best regards, Myron Stankovich, M.D.

## 2023-05-03 ENCOUNTER — Other Ambulatory Visit: Payer: Self-pay | Admitting: Family Medicine

## 2023-05-03 DIAGNOSIS — E119 Type 2 diabetes mellitus without complications: Secondary | ICD-10-CM

## 2023-05-04 ENCOUNTER — Other Ambulatory Visit: Payer: Self-pay | Admitting: Family Medicine

## 2023-05-04 NOTE — Telephone Encounter (Signed)
 Copied from CRM [id number]. Topic: Clinical - Medication Refill >> May 04, 2023 11:50 AM Antwanette L wrote: Most Recent Primary Care Visit:   Medication: amLODipine (NORVASC) 10 MG tablet  Has the patient contacted their pharmacy? No   Is this the correct pharmacy for this prescription? Yes  [id number] - [place], [place] - [address] [place] [address] Phone: [phone] Fax: [phone]   Has the prescription been filled recently? No. Last refilled on 10/26/22.  Is the patient out of the medication? No.  Has the patient been seen for an appointment in the last year OR does the patient have an upcoming appointment? Yes. Patient saw Dr. Darlyn Read on 04/16/23  Can we respond through MyChart? No. Contact patient by phone at [phone]  Agent: Please be advised that Rx refills may take up to 3 business days. We ask that you follow-up with your pharmacy.

## 2023-05-04 NOTE — Telephone Encounter (Signed)
 Cancelled reorder. Patient had 3 month supply with 3 refills sent 10/2022. Patient has refills available at pharmacy. Closing encounter.

## 2023-06-10 ENCOUNTER — Other Ambulatory Visit: Payer: 59

## 2023-06-10 ENCOUNTER — Inpatient Hospital Stay: Payer: 59

## 2023-06-10 ENCOUNTER — Ambulatory Visit (HOSPITAL_COMMUNITY): Payer: 59

## 2023-06-17 ENCOUNTER — Inpatient Hospital Stay: Payer: 59 | Admitting: Hematology

## 2023-07-20 ENCOUNTER — Ambulatory Visit: Admitting: Family Medicine

## 2023-07-29 ENCOUNTER — Other Ambulatory Visit: Payer: Self-pay | Admitting: Family Medicine

## 2023-07-29 DIAGNOSIS — E119 Type 2 diabetes mellitus without complications: Secondary | ICD-10-CM

## 2023-07-29 NOTE — Telephone Encounter (Signed)
 Left message to schedule appt for next month

## 2023-08-04 ENCOUNTER — Other Ambulatory Visit (HOSPITAL_COMMUNITY)

## 2023-09-28 ENCOUNTER — Other Ambulatory Visit: Payer: Self-pay | Admitting: Family Medicine

## 2023-09-28 ENCOUNTER — Encounter: Payer: Self-pay | Admitting: Family Medicine

## 2023-09-28 DIAGNOSIS — E119 Type 2 diabetes mellitus without complications: Secondary | ICD-10-CM

## 2023-09-28 NOTE — Telephone Encounter (Signed)
 Stacks pt NTBS 30-d given 07/29/23

## 2023-09-28 NOTE — Telephone Encounter (Signed)
 Letter sent

## 2023-10-02 ENCOUNTER — Other Ambulatory Visit: Payer: Self-pay | Admitting: Family Medicine

## 2023-10-02 DIAGNOSIS — E119 Type 2 diabetes mellitus without complications: Secondary | ICD-10-CM

## 2023-10-08 ENCOUNTER — Other Ambulatory Visit: Payer: Self-pay

## 2023-10-08 DIAGNOSIS — C8108 Nodular lymphocyte predominant Hodgkin lymphoma, lymph nodes of multiple sites: Secondary | ICD-10-CM

## 2023-10-09 ENCOUNTER — Inpatient Hospital Stay: Attending: Oncology

## 2023-10-09 DIAGNOSIS — D709 Neutropenia, unspecified: Secondary | ICD-10-CM | POA: Diagnosis not present

## 2023-10-09 DIAGNOSIS — Z8571 Personal history of Hodgkin lymphoma: Secondary | ICD-10-CM | POA: Diagnosis not present

## 2023-10-09 DIAGNOSIS — E119 Type 2 diabetes mellitus without complications: Secondary | ICD-10-CM | POA: Insufficient documentation

## 2023-10-09 DIAGNOSIS — C8108 Nodular lymphocyte predominant Hodgkin lymphoma, lymph nodes of multiple sites: Secondary | ICD-10-CM

## 2023-10-09 DIAGNOSIS — Z9221 Personal history of antineoplastic chemotherapy: Secondary | ICD-10-CM | POA: Diagnosis not present

## 2023-10-09 DIAGNOSIS — Z9081 Acquired absence of spleen: Secondary | ICD-10-CM | POA: Insufficient documentation

## 2023-10-09 DIAGNOSIS — R590 Localized enlarged lymph nodes: Secondary | ICD-10-CM | POA: Insufficient documentation

## 2023-10-09 LAB — COMPREHENSIVE METABOLIC PANEL WITH GFR
ALT: 29 U/L (ref 0–44)
AST: 25 U/L (ref 15–41)
Albumin: 4.6 g/dL (ref 3.5–5.0)
Alkaline Phosphatase: 68 U/L (ref 38–126)
Anion gap: 9 (ref 5–15)
BUN: 16 mg/dL (ref 6–20)
CO2: 25 mmol/L (ref 22–32)
Calcium: 9.2 mg/dL (ref 8.9–10.3)
Chloride: 104 mmol/L (ref 98–111)
Creatinine, Ser: 0.93 mg/dL (ref 0.61–1.24)
GFR, Estimated: >60 mL/min (ref >60)
Glucose, Bld: 147 mg/dL — ABNORMAL HIGH (ref 70–99)
Potassium: 3.8 mmol/L (ref 3.5–5.1)
Sodium: 138 mmol/L (ref 135–145)
Total Bilirubin: 0.8 mg/dL (ref 0.0–1.2)
Total Protein: 7 g/dL (ref 6.5–8.1)

## 2023-10-09 LAB — CBC WITH DIFFERENTIAL/PLATELET
Abs Immature Granulocytes: 0.01 K/uL (ref 0.00–0.07)
Basophils Absolute: 0 K/uL (ref 0.0–0.1)
Basophils Relative: 1 %
Eosinophils Absolute: 0.1 K/uL (ref 0.0–0.5)
Eosinophils Relative: 2 %
HCT: 43.2 % (ref 39.0–52.0)
Hemoglobin: 14.3 g/dL (ref 13.0–17.0)
Immature Granulocytes: 0 %
Lymphocytes Relative: 49 %
Lymphs Abs: 1.6 K/uL (ref 0.7–4.0)
MCH: 27.7 pg (ref 26.0–34.0)
MCHC: 33.1 g/dL (ref 30.0–36.0)
MCV: 83.6 fL (ref 80.0–100.0)
Monocytes Absolute: 0.4 K/uL (ref 0.1–1.0)
Monocytes Relative: 11 %
Neutro Abs: 1.2 K/uL — ABNORMAL LOW (ref 1.7–7.7)
Neutrophils Relative %: 37 %
Platelets: 211 K/uL (ref 150–400)
RBC: 5.17 MIL/uL (ref 4.22–5.81)
RDW: 14.7 % (ref 11.5–15.5)
WBC: 3.3 K/uL — ABNORMAL LOW (ref 4.0–10.5)
nRBC: 0 % (ref 0.0–0.2)

## 2023-10-09 LAB — LACTATE DEHYDROGENASE: LDH: 163 U/L (ref 98–192)

## 2023-10-11 ENCOUNTER — Other Ambulatory Visit: Payer: Self-pay | Admitting: Family Medicine

## 2023-10-11 DIAGNOSIS — E119 Type 2 diabetes mellitus without complications: Secondary | ICD-10-CM

## 2023-10-16 ENCOUNTER — Ambulatory Visit (HOSPITAL_COMMUNITY)
Admission: RE | Admit: 2023-10-16 | Discharge: 2023-10-16 | Disposition: A | Discharge status: Home / Self Care | Payer: Self-pay | Source: Ambulatory Visit | Attending: Hematology | Admitting: Hematology

## 2023-10-16 DIAGNOSIS — R590 Localized enlarged lymph nodes: Secondary | ICD-10-CM | POA: Diagnosis not present

## 2023-10-16 DIAGNOSIS — C8190 Hodgkin lymphoma, unspecified, unspecified site: Secondary | ICD-10-CM | POA: Diagnosis not present

## 2023-10-16 DIAGNOSIS — C8108 Nodular lymphocyte predominant Hodgkin lymphoma, lymph nodes of multiple sites: Secondary | ICD-10-CM | POA: Diagnosis not present

## 2023-10-16 DIAGNOSIS — R932 Abnormal findings on diagnostic imaging of liver and biliary tract: Secondary | ICD-10-CM | POA: Diagnosis not present

## 2023-10-16 MED ORDER — IOHEXOL 9 MG/ML PO SOLN
ORAL | Status: AC
Start: 2023-10-16 — End: 2023-10-16
  Filled 2023-10-16: qty 1000

## 2023-10-16 MED ORDER — IOHEXOL 300 MG/ML  SOLN
100.0000 mL | Freq: Once | INTRAMUSCULAR | Status: AC | PRN
  Administered 2023-10-16: 100 mL via INTRAVENOUS

## 2023-10-16 MED ORDER — IOHEXOL 9 MG/ML PO SOLN
500.0000 mL | ORAL | Status: AC
  Administered 2023-10-16: 1000 mL via ORAL

## 2023-10-23 ENCOUNTER — Inpatient Hospital Stay: Payer: Self-pay | Admitting: Oncology

## 2023-10-23 VITALS — BP 120/84 | HR 79 | Temp 98.1°F | Resp 16 | Wt 226.5 lb

## 2023-10-23 DIAGNOSIS — D709 Neutropenia, unspecified: Secondary | ICD-10-CM | POA: Diagnosis not present

## 2023-10-23 DIAGNOSIS — Z9221 Personal history of antineoplastic chemotherapy: Secondary | ICD-10-CM | POA: Diagnosis not present

## 2023-10-23 DIAGNOSIS — C8190 Hodgkin lymphoma, unspecified, unspecified site: Secondary | ICD-10-CM | POA: Diagnosis not present

## 2023-10-23 DIAGNOSIS — E119 Type 2 diabetes mellitus without complications: Secondary | ICD-10-CM | POA: Diagnosis not present

## 2023-10-23 DIAGNOSIS — R590 Localized enlarged lymph nodes: Secondary | ICD-10-CM | POA: Diagnosis not present

## 2023-10-23 DIAGNOSIS — Z9081 Acquired absence of spleen: Secondary | ICD-10-CM | POA: Diagnosis not present

## 2023-10-23 DIAGNOSIS — Z8571 Personal history of Hodgkin lymphoma: Secondary | ICD-10-CM | POA: Diagnosis not present

## 2023-10-23 NOTE — Progress Notes (Signed)
 [facility] OFFICE PROGRESS NOTE  Devin Lowers, MD  ASSESSMENT & PLAN:    Assessment & Plan Hodgkin lymphoma, unspecified Hodgkin lymphoma type, unspecified body region [facility]) - He does not have any B symptoms or recurrent infections. - Physical exam: No palpable adenopathy or splenomegaly. - Reviewed labs from 10/09/2023: Normal LFTs and LDH.  Creatinine was normal.  CBC was grossly normal with mild leukopenia and neutropenia. - No evidence of recurrence at this time clinically. -Most recent CT scan did not reveal evidence of recurrence.  No additional imaging unless symptoms warrant. - RTC 6 months for follow-up.    Orders Placed This Encounter  Procedures   CBC with Differential    Standing Status:   Future    Expected Date:   04/21/2024    Expiration Date:   07/20/2024   Lactate dehydrogenase    Standing Status:   Future    Expected Date:   04/21/2024    Expiration Date:   07/20/2024   Comprehensive metabolic panel    Standing Status:   Future    Expected Date:   04/21/2024    Expiration Date:   07/20/2024    INTERVAL HISTORY: Patient returns for follow-up.  He recently had a CT chest abdomen pelvis on 10/16/2023 which revealed stable prominent mildly enlarged right axillary lymph node.  No new progressive adenopathy in the chest, abdomen or pelvis.  No splenomegaly.  Stable subtle 13 mm enhancing focus in the lateral dome of the liver likely reflecting a hemangioma or intrahepatic shunt.  Reports appetite and energy levels are 100%.   We reviewed CBC, CMP LDH.  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  NLP Hodgkin's lymphoma, recurrent 3B: -6 cycles of  R-CHOP from 05/24/2018 through 09/14/2018. -XRT to the right axillary lymph node completed around November 2020. -PET scan on 03/21/2019 showed right axillary and subpectoral lymph nodes with the largest right axillary lymph node measuring 1.7 cm, [place] 2. -CT CAP on 09/19/2019 showed patchy bilateral groundglass opacities.  Mildly enlarged right axillary nodes largest measuring 13 x 26 mm unchanged since September 2020.  Subpectoral lymph nodes in the right chest and high right axillary lymph nodes without change.  No adenopathy in the abdomen or pelvis.   2.  Diabetes: -He is taking glipizide  XR 5 mg daily. -We will check hemoglobin A1c next visit.   3.  Post splenectomy state: -Received Menactra and Pneumovax on 05/25/2018.  CBC    Component Value Date/Time   WBC 3.3 (L) 10/09/2023 1107   RBC 5.17 10/09/2023 1107   HGB 14.3 10/09/2023 1107   HGB 15.3 04/16/2023 1416   HCT 43.2 10/09/2023 1107   HCT 46.8 04/16/2023 1416   PLT 211 10/09/2023 1107   PLT 231 04/16/2023 1416   MCV 83.6 10/09/2023 1107   MCV 86 04/16/2023 1416   MCH 27.7 10/09/2023 1107   MCHC 33.1 10/09/2023 1107   RDW 14.7 10/09/2023 1107   RDW 14.0 04/16/2023 1416   LYMPHSABS 1.6 10/09/2023 1107   LYMPHSABS 1.8 04/16/2023 1416   MONOABS 0.4 10/09/2023 1107   EOSABS 0.1 10/09/2023 1107   EOSABS 0.0 04/16/2023 1416   BASOSABS 0.0 10/09/2023 1107  BASOSABS 0.0 04/16/2023 1416       Latest Ref Rng & Units 10/09/2023   11:07 AM 04/16/2023    2:16 PM 02/02/2023    3:56 PM  CMP  Glucose 70 - 99 mg/dL 852  86  866   BUN 6 - 20 mg/dL 16  15  13    Creatinine 0.61 - 1.24 mg/dL 9.06  8.80  8.88   Sodium 135 - 145 mmol/L 138  143  142   Potassium 3.5 - 5.1 mmol/L 3.8  4.1  3.9   Chloride 98 - 111 mmol/L 104  104  104   CO2 22 - 32 mmol/L 25  22  22    Calcium  8.9 - 10.3 mg/dL 9.2  9.6  9.8   Total Protein 6.5 - 8.1 g/dL 7.0  6.7  6.6   Total Bilirubin 0.0 - 1.2 mg/dL 0.8  0.5  0.3   Alkaline Phos 38 - 126 U/L 68   79  82   AST 15 - 41 U/L 25  23  24    ALT 0 - 44 U/L 29  24  26       Lab Results  Component Value Date   VITAMINB12 718 03/26/2020    There were no vitals filed for this visit.  Review of System:  Review of Systems  Constitutional:  Negative for malaise/fatigue and weight loss.  Neurological:  Negative for tingling, speech change, weakness and headaches.  Psychiatric/Behavioral:  The patient is not nervous/anxious.     Physical Exam: Physical Exam Constitutional:      Appearance: Normal appearance.  HENT:     Head: Normocephalic and atraumatic.  Eyes:     Pupils: Pupils are equal, round, and reactive to light.  Cardiovascular:     Rate and Rhythm: Normal rate and regular rhythm.     Heart sounds: Normal heart sounds. No murmur heard. Pulmonary:     Effort: Pulmonary effort is normal.     Breath sounds: Normal breath sounds. No wheezing.  Abdominal:     General: Bowel sounds are normal. There is no distension.     Palpations: Abdomen is soft.     Tenderness: There is no abdominal tenderness.  Musculoskeletal:        General: Normal range of motion.     Cervical back: Normal range of motion.  Lymphadenopathy:     Cervical: No cervical adenopathy.     Upper Body:     Right upper body: No axillary adenopathy.     Left upper body: No axillary adenopathy.  Skin:    General: Skin is warm and dry.     Findings: No rash.  Neurological:     Mental Status: He is alert and oriented to person, place, and time.     Gait: Gait is intact.  Psychiatric:        Mood and Affect: Mood and affect normal.        Cognition and Memory: Memory normal.        Judgment: Judgment normal.      I spent 20 minutes dedicated to the care of this patient (face-to-face and non-face-to-face) on the date of the encounter to include what is described in the assessment and plan.,  Devin Hope, NP 10/23/2023 11:08 AM

## 2023-10-23 NOTE — Assessment & Plan Note (Addendum)
-   He does not have any B symptoms or recurrent infections. - Physical exam: No palpable adenopathy or splenomegaly. - Reviewed labs from 10/09/2023: Normal LFTs and LDH.  Creatinine was normal.  CBC was grossly normal with mild leukopenia and neutropenia. - No evidence of recurrence at this time clinically. -Most recent CT scan did not reveal evidence of recurrence.  No additional imaging unless symptoms warrant. - RTC 6 months for follow-up.

## 2023-10-29 ENCOUNTER — Encounter: Payer: Self-pay | Admitting: Gastroenterology

## 2023-11-02 ENCOUNTER — Ambulatory Visit: Payer: Self-pay | Admitting: Family Medicine

## 2023-11-02 ENCOUNTER — Encounter: Payer: Self-pay | Admitting: Family Medicine

## 2023-11-02 VITALS — BP 111/69 | HR 72 | Temp 97.8°F | Ht 73.0 in | Wt 229.0 lb

## 2023-11-02 DIAGNOSIS — E119 Type 2 diabetes mellitus without complications: Secondary | ICD-10-CM | POA: Diagnosis not present

## 2023-11-02 DIAGNOSIS — E782 Mixed hyperlipidemia: Secondary | ICD-10-CM

## 2023-11-02 DIAGNOSIS — Z125 Encounter for screening for malignant neoplasm of prostate: Secondary | ICD-10-CM

## 2023-11-02 DIAGNOSIS — M25511 Pain in right shoulder: Secondary | ICD-10-CM

## 2023-11-02 DIAGNOSIS — I10 Essential (primary) hypertension: Secondary | ICD-10-CM

## 2023-11-02 DIAGNOSIS — Z7984 Long term (current) use of oral hypoglycemic drugs: Secondary | ICD-10-CM

## 2023-11-02 DIAGNOSIS — G8929 Other chronic pain: Secondary | ICD-10-CM

## 2023-11-02 DIAGNOSIS — Z23 Encounter for immunization: Secondary | ICD-10-CM | POA: Diagnosis not present

## 2023-11-02 LAB — LIPID PANEL

## 2023-11-02 NOTE — Progress Notes (Signed)
 Subjective:  Patient ID: Devin Tran, male    DOB: [birth year]  Age: 58 y.o. MRN: [id number]  CC: Medical Management of Chronic Issues (6 month follow up)   HPI  Discussed the use of AI scribe software for clinical note transcription with the patient, who gave verbal consent to proceed.  History of Present Illness Devin Tran is a 58 year old male with diabetes and Hodgkin's lymphoma who presents for a follow-up visit regarding diabetes management and recent cancer screening.  He has been without insurance due to a job transition but has now secured employment and insurance. He works at [employer] on [address] [employer], which involves strenuous 12-hour shifts, four days a week, with occasional overtime.  Regarding diabetes, he continues to take metformin  as prescribed. Blood sugar levels are in the normal range, typically between the 80s and low 100s, especially after work before meals. He had previously received free diabetic supplies through insurance but had to purchase strips from [facility] after losing his job. No symptoms of hypoglycemia such as weakness, shakiness, or nausea.  He recently underwent a cancer screening for Hodgkin's lymphoma, which showed no significant changes. The scan revealed a couple of small lymph nodes under his armpit that have not changed since the last evaluation. Blood work was reported as good, and he is scheduled for follow-up blood work in six months, with the next scan planned for a year from now.  He is currently taking amlodipine  for blood pressure management. He also takes rosuvastatin  daily for cholesterol management and denies any significant side effects, although he experiences general achiness, which he attributes to his physically demanding job.  He has restarted nabumetone  for muscle and joint pain, particularly in his right shoulder, which is sore but maintains full range of motion without numbness or tingling.  He denies smoking and has a history of  Hodgkin's lymphoma diagnosed at 58 years old. He has reduced his soda intake significantly but has not completely eliminated it from his diet.          11/02/2023    4:07 PM 10/23/2023   11:11 AM 02/02/2023    3:55 PM  Depression screen PHQ 2/9  Decreased Interest 0 0 0  Down, Depressed, Hopeless 0 0 0  PHQ - 2 Score 0 0 0    History Jaymin has a past medical history of Complication of anesthesia, Erectile dysfunction, Hodgkin's lymphoma (HCC), Hyperlipidemia, Hypertension, Infertility, Low testosterone , and PONV (postoperative nausea and vomiting).   He has a past surgical history that includes Splenectomy (Left, Over 20 years ago); PORTA CATH INSERTION; PORTA CATH REMOVAL; Axillary lymph node biopsy (Right, 05/10/2018); Portacath placement (Left, 05/21/2018); IR LYMPHANGIOGRAM EXTREMITY UNI LEFT (03/02/1987); and Port-a-cath removal (Left, 06/24/2021).   His family history includes COPD in his father; Cancer in his father and paternal uncle; Diabetes in his father; Hypertension in his brother, father, and mother; Stroke in his brother.He reports that he has never smoked. He has never used smokeless tobacco. He reports that he does not drink alcohol and does not use drugs.    ROS Review of Systems  Constitutional:  Negative for fever.  Respiratory:  Negative for shortness of breath.   Cardiovascular:  Negative for chest pain.  Musculoskeletal:  Positive for myalgias. Negative for arthralgias.  Skin:  Negative for rash.    Objective:  BP 111/69   Pulse 72   Temp 97.8 F (36.6 C)   Ht 6' 1 (1.854 m)   Wt 229  lb (103.9 kg)   SpO2 97%   BMI 30.21 kg/m   BP Readings from Last 3 Encounters:  11/02/23 111/69  10/23/23 120/84  04/16/23 120/74    Wt Readings from Last 3 Encounters:  11/02/23 229 lb (103.9 kg)  10/23/23 226 lb 8 oz (102.7 kg)  04/16/23 230 lb (104.3 kg)     Physical Exam Physical Exam VITALS: BP- 111/69 MEASUREMENTS: Weight- 229. GENERAL: Alert,  cooperative, well developed, no acute distress. HEENT: Normocephalic, normal oropharynx, moist mucous membranes, pharynx normal. CHEST: Clear to auscultation bilaterally, no wheezes, rhonchi, or crackles. CARDIOVASCULAR: Normal heart rate and rhythm, S1 and S2 normal without murmurs. ABDOMEN: Soft, non-tender, non-distended, without organomegaly, normal bowel sounds. EXTREMITIES: No cyanosis or edema. NEUROLOGICAL: Cranial nerves grossly intact, extraocular movements intact, moves all extremities without gross motor or sensory deficit.   Assessment & Plan:  Prostate cancer screening -     PSA, total and free  Type 2 diabetes mellitus without complication, without long-term current use of insulin (HCC) -     Bayer DCA Hb A1c Waived -     CMP14+EGFR -     Microalbumin / creatinine urine ratio -     Vitamin B12  Primary hypertension -     CBC with Differential/Platelet  Mixed hyperlipidemia -     Lipid panel  Encounter for immunization -     Flu vaccine trivalent PF, 6mos and older(Flulaval,Afluria,Fluarix,Fluzone)  Chronic right shoulder pain    Assessment and Plan Assessment & Plan Right shoulder pain   He experiences right shoulder pain described as soreness with full range of motion, likely due to the physical demands of his new job involving 12-hour shifts. There is no numbness or tingling. Continue nabumetone , 2 pills twice a day, for pain management.  Type 2 diabetes mellitus   His type 2 diabetes mellitus is managed with metformin . Blood glucose levels are within normal range, typically between 80 to low 100s, especially after work. No hypoglycemia episodes reported. A1c result is pending. Continue metformin  as prescribed. Encourage dietary modifications, including eliminating sodas and sweets, and regular physical activity, even during work hours if possible. Review A1c results once available.  Essential hypertension   His essential hypertension is well-controlled with  amlodipine . Recent blood pressure reading was 111/69 mmHg. Continue amlodipine  as prescribed.  Mixed hyperlipidemia   His mixed hyperlipidemia is managed with rosuvastatin , with no reported side effects. Continue rosuvastatin  as prescribed.  Hodgkin lymphoma, under surveillance   His Hodgkin lymphoma is under surveillance. Recent cancer screening showed no significant changes, with stable lymph nodes under the armpit. Blood work was reported as good. Continue surveillance with follow-up in 6 months for blood work and clinical evaluation. Plan for annual scan unless otherwise indicated.       Follow-up: Return in about 3 months (around 02/01/2024).  Butler Der, M.D.

## 2023-11-03 ENCOUNTER — Ambulatory Visit: Payer: Self-pay | Admitting: Family Medicine

## 2023-11-03 LAB — CBC WITH DIFFERENTIAL/PLATELET
Basophils Absolute: 0 x10E3/uL (ref 0.0–0.2)
Basos: 1 %
EOS (ABSOLUTE): 0.1 x10E3/uL (ref 0.0–0.4)
Eos: 2 %
Hematocrit: 45.6 % (ref 37.5–51.0)
Hemoglobin: 14.4 g/dL (ref 13.0–17.7)
Immature Grans (Abs): 0 x10E3/uL (ref 0.0–0.1)
Immature Granulocytes: 0 %
Lymphocytes Absolute: 1.4 x10E3/uL (ref 0.7–3.1)
Lymphs: 40 %
MCH: 27.6 pg (ref 26.6–33.0)
MCHC: 31.6 g/dL (ref 31.5–35.7)
MCV: 87 fL (ref 79–97)
Monocytes Absolute: 0.6 x10E3/uL (ref 0.1–0.9)
Monocytes: 15 %
NRBC: 1 % — ABNORMAL HIGH (ref 0–0)
Neutrophils Absolute: 1.5 x10E3/uL (ref 1.4–7.0)
Neutrophils: 42 %
Platelets: 237 x10E3/uL (ref 150–450)
RBC: 5.22 x10E6/uL (ref 4.14–5.80)
RDW: 14.5 % (ref 11.6–15.4)
WBC: 3.6 x10E3/uL (ref 3.4–10.8)

## 2023-11-03 LAB — CMP14+EGFR
ALT: 29 IU/L (ref 0–44)
AST: 21 IU/L (ref 0–40)
Albumin: 4.8 g/dL (ref 3.8–4.9)
Alkaline Phosphatase: 86 IU/L (ref 47–123)
BUN/Creatinine Ratio: 15 (ref 9–20)
BUN: 18 mg/dL (ref 6–24)
Bilirubin Total: 0.3 mg/dL (ref 0.0–1.2)
CO2: 22 mmol/L (ref 20–29)
Calcium: 9.6 mg/dL (ref 8.7–10.2)
Chloride: 103 mmol/L (ref 96–106)
Creatinine, Ser: 1.22 mg/dL (ref 0.76–1.27)
Globulin, Total: 1.9 g/dL (ref 1.5–4.5)
Glucose: 143 mg/dL — AB (ref 70–99)
Potassium: 3.8 mmol/L (ref 3.5–5.2)
Sodium: 141 mmol/L (ref 134–144)
Total Protein: 6.7 g/dL (ref 6.0–8.5)
eGFR: 70 mL/min/1.73 (ref >59)

## 2023-11-03 LAB — MICROALBUMIN / CREATININE URINE RATIO
Creatinine, Urine: 336.6 mg/dL
Microalb/Creat Ratio: 10 mg/g{creat} (ref 0–29)
Microalbumin, Urine: 32.8 ug/mL

## 2023-11-03 LAB — LIPID PANEL
Cholesterol, Total: 113 mg/dL (ref 100–199)
HDL: 42 mg/dL (ref >39)
LDL CALC COMMENT:: 2.7 ratio (ref 0.0–5.0)
LDL Chol Calc (NIH): 51 mg/dL (ref 0–99)
Triglycerides: 106 mg/dL (ref 0–149)
VLDL Cholesterol Cal: 20 mg/dL (ref 5–40)

## 2023-11-03 LAB — VITAMIN B12: Vitamin B-12: 653 pg/mL (ref 232–1245)

## 2023-11-03 LAB — PSA, TOTAL AND FREE
PSA, Free Pct: 67.5 %
PSA, Free: 0.27 ng/mL
Prostate Specific Ag, Serum: 0.4 ng/mL (ref 0.0–4.0)

## 2023-11-03 LAB — BAYER DCA HB A1C WAIVED: HB A1C (BAYER DCA - WAIVED): 7.7 % — ABNORMAL HIGH (ref 4.8–5.6)

## 2023-11-03 NOTE — Progress Notes (Signed)
Hello Devin Tran,  Your lab result is normal and/or stable.Some minor variations that are not significant are commonly marked abnormal, but do not represent any medical problem for you.  Best regards, Myron Stankovich, M.D.

## 2023-11-27 ENCOUNTER — Encounter: Payer: Self-pay | Admitting: *Deleted

## 2023-12-28 DIAGNOSIS — H44002 Unspecified purulent endophthalmitis, left eye: Secondary | ICD-10-CM | POA: Diagnosis not present

## 2024-01-13 ENCOUNTER — Other Ambulatory Visit: Payer: Self-pay | Admitting: Family Medicine

## 2024-01-13 DIAGNOSIS — E782 Mixed hyperlipidemia: Secondary | ICD-10-CM

## 2024-01-26 ENCOUNTER — Encounter: Payer: Self-pay | Admitting: Family Medicine

## 2024-01-26 ENCOUNTER — Ambulatory Visit (INDEPENDENT_AMBULATORY_CARE_PROVIDER_SITE_OTHER): Payer: Self-pay | Admitting: Family Medicine

## 2024-01-26 VITALS — BP 120/77 | HR 98 | Temp 98.1°F | Ht 73.0 in | Wt 227.0 lb

## 2024-01-26 DIAGNOSIS — Z7984 Long term (current) use of oral hypoglycemic drugs: Secondary | ICD-10-CM | POA: Diagnosis not present

## 2024-01-26 DIAGNOSIS — E782 Mixed hyperlipidemia: Secondary | ICD-10-CM

## 2024-01-26 DIAGNOSIS — E119 Type 2 diabetes mellitus without complications: Secondary | ICD-10-CM | POA: Diagnosis not present

## 2024-01-26 LAB — BAYER DCA HB A1C WAIVED: HB A1C (BAYER DCA - WAIVED): 7.7 % — ABNORMAL HIGH (ref 4.8–5.6)

## 2024-01-26 MED ORDER — AMLODIPINE BESYLATE 10 MG PO TABS: 10.0000 mg | ORAL_TABLET | Freq: Every day | ORAL | 0 refills | Status: AC

## 2024-01-26 MED ORDER — METFORMIN HCL ER 750 MG PO TB24: 750.0000 mg | ORAL_TABLET | Freq: Two times a day (BID) | ORAL | 0 refills | Status: AC

## 2024-01-26 NOTE — Progress Notes (Signed)
 "  Subjective:  Patient ID: Devin Tran, male    DOB: [birth year]  Age: 58 y.o. MRN: [id number]  CC: Medical Management of Chronic Issues   HPI  Discussed the use of AI scribe software for clinical note transcription with the patient, who gave verbal consent to proceed.  History of Present Illness Devin Tran is a 58 year old male with diabetes who presents for routine follow-up.  He has been managing his diabetes with stable blood sugar levels, typically in the low hundreds, with occasional readings below 100. He checks his blood sugar in the evenings after work. His 12-hour work shifts have impacted his ability to maintain his previous exercise routine. He is currently taking metformin  twice a day.  He is also on amlodipine  for blood pressure and rosuvastatin  for cholesterol. He was previously prescribed nabumetone  for muscle and joint aches, particularly in his shoulders, which have been flaring up again. He has some medication at home and takes it as needed.  About a month and a half ago, he experienced swelling in his eyelid, possibly due to metal exposure, and was treated with antibiotics and eye drops at an urgent care facility. The issue has mostly resolved, though he mentions there is a little wax on the right side.  He reports occasional difficulty swallowing, which he associates with certain foods, but does not experience frequent heartburn. He can alleviate the swallowing difficulty by drinking water .  No symptoms of hypoglycemia such as weakness or shakiness.          11/02/2023    4:07 PM 10/23/2023   11:11 AM 02/02/2023    3:55 PM  Depression screen PHQ 2/9  Decreased Interest 0 0 0  Down, Depressed, Hopeless 0 0 0  PHQ - 2 Score 0 0 0    History Devin Tran has a past medical history of Complication of anesthesia, Erectile dysfunction, Hodgkin's lymphoma (HCC), Hyperlipidemia, Hypertension, Infertility, Low testosterone , and PONV (postoperative nausea and vomiting).    He has a past surgical history that includes Splenectomy (Left, Over 20 years ago); PORTA CATH INSERTION; PORTA CATH REMOVAL; Axillary lymph node biopsy (Right, 05/10/2018); Portacath placement (Left, 05/21/2018); IR LYMPHANGIOGRAM EXTREMITY UNI LEFT (03/02/1987); and Port-a-cath removal (Left, 06/24/2021).   His family history includes COPD in his father; Cancer in his father and paternal uncle; Diabetes in his father; Hypertension in his brother, father, and mother; Stroke in his brother.He reports that he has never smoked. He has never used smokeless tobacco. He reports that he does not drink alcohol and does not use drugs.    ROS Review of Systems  Constitutional: Negative.   HENT: Negative.    Eyes:  Negative for visual disturbance.  Respiratory:  Negative for cough and shortness of breath.   Cardiovascular:  Negative for chest pain and leg swelling.  Gastrointestinal:  Negative for abdominal pain, diarrhea, nausea and vomiting.  Genitourinary:  Negative for difficulty urinating.  Musculoskeletal:  Negative for arthralgias and myalgias.  Skin:  Negative for rash.  Neurological:  Negative for headaches.  Psychiatric/Behavioral:  Negative for sleep disturbance.     Objective:  BP 120/77   Pulse 98   Temp 98.1 F (36.7 C)   Ht 6' 1 (1.854 m)   Wt 227 lb (103 kg)   SpO2 95%   BMI 29.95 kg/m   BP Readings from Last 3 Encounters:  01/26/24 120/77  11/02/23 111/69  10/23/23 120/84    Wt Readings from Last 3 Encounters:  01/26/24 227  lb (103 kg)  11/02/23 229 lb (103.9 kg)  10/23/23 226 lb 8 oz (102.7 kg)     Physical Exam Physical Exam GENERAL: Alert, cooperative, well developed, no acute distress. HEENT: Normocephalic, normal oropharynx, moist mucous membranes, cerumen present in right ear. CHEST: Clear to auscultation bilaterally, no wheezes, rhonchi, or crackles. CARDIOVASCULAR: Normal heart rate and rhythm, S1 and S2 normal without murmurs. ABDOMEN: Soft,  non-tender, non-distended, without organomegaly, normal bowel sounds. EXTREMITIES: No cyanosis or edema. NEUROLOGICAL: Cranial nerves grossly intact, moves all extremities without gross motor or sensory deficit.   Assessment & Plan:  Mixed hyperlipidemia -     Bayer DCA Hb A1c Waived -     CBC with Differential/Platelet -     Comprehensive metabolic panel with GFR  Type 2 diabetes mellitus without complication, without long-term current use of insulin (HCC) -     Bayer DCA Hb A1c Waived -     CBC with Differential/Platelet -     Comprehensive metabolic panel with GFR -     metFORMIN  HCl ER; Take 1 tablet (750 mg total) by mouth 2 (two) times daily before a meal.  Dispense: 180 tablet; Refill: 0  Other orders -     amLODIPine  Besylate; Take 1 tablet (10 mg total) by mouth daily.  Dispense: 90 tablet; Refill: 0    Assessment and Plan Assessment & Plan Type 2 diabetes mellitus   Blood glucose levels are generally well-controlled, with evening readings mostly in the low hundreds and occasionally below 100. No hypoglycemia reported. A1c is 7.7, indicating suboptimal control. He has been reducing intake of starches, concentrated sweets, and sodas but was unaware of the carbohydrate content in corn, rice, potatoes, and bread. Continue metformin  twice daily. Educated on carbohydrate content in corn, rice, potatoes, and bread. Follow up in 3 months to reassess A1c and blood glucose control.  Essential hypertension   Blood pressure is managed with amlodipine . Continue amlodipine .  Mixed hyperlipidemia   Cholesterol is managed with rosuvastatin . Continue rosuvastatin .  Right shoulder pain   Intermittent right shoulder pain is currently flaring up. Previously managed with nabumetone  as needed, but symptoms have worsened. Resume nabumetone  as needed.  Gastroesophageal reflux symptoms   Reports occasional difficulty swallowing, possibly related to acid reflux. No significant heartburn  reported. Symptoms are not severe or frequent. Monitor symptoms and report if he worsens or becomes more frequent.       Follow-up: Return in about 3 months (around 04/25/2024).  Butler Der, M.D. "

## 2024-01-27 LAB — CBC WITH DIFFERENTIAL/PLATELET
Basophils Absolute: 0 x10E3/uL (ref 0.0–0.2)
Basos: 0 %
EOS (ABSOLUTE): 0.1 x10E3/uL (ref 0.0–0.4)
Eos: 2 %
Hematocrit: 46.1 % (ref 37.5–51.0)
Hemoglobin: 15.2 g/dL (ref 13.0–17.7)
Immature Grans (Abs): 0 x10E3/uL (ref 0.0–0.1)
Immature Granulocytes: 0 %
Lymphocytes Absolute: 1.7 x10E3/uL (ref 0.7–3.1)
Lymphs: 37 %
MCH: 27.9 pg (ref 26.6–33.0)
MCHC: 33 g/dL (ref 31.5–35.7)
MCV: 85 fL (ref 79–97)
Monocytes Absolute: 0.6 x10E3/uL (ref 0.1–0.9)
Monocytes: 13 %
Neutrophils Absolute: 2.2 x10E3/uL (ref 1.4–7.0)
Neutrophils: 48 %
Platelets: 241 x10E3/uL (ref 150–450)
RBC: 5.44 x10E6/uL (ref 4.14–5.80)
RDW: 14.4 % (ref 11.6–15.4)
WBC: 4.6 x10E3/uL (ref 3.4–10.8)

## 2024-01-27 LAB — COMPREHENSIVE METABOLIC PANEL WITH GFR
ALT: 29 IU/L (ref 0–44)
AST: 22 IU/L (ref 0–40)
Albumin: 4.8 g/dL (ref 3.8–4.9)
Alkaline Phosphatase: 84 IU/L (ref 47–123)
BUN/Creatinine Ratio: 15 (ref 9–20)
BUN: 16 mg/dL (ref 6–24)
Bilirubin Total: 0.3 mg/dL (ref 0.0–1.2)
CO2: 21 mmol/L (ref 20–29)
Calcium: 9.8 mg/dL (ref 8.7–10.2)
Chloride: 103 mmol/L (ref 96–106)
Creatinine, Ser: 1.04 mg/dL (ref 0.76–1.27)
Globulin, Total: 2.1 g/dL (ref 1.5–4.5)
Glucose: 157 mg/dL — ABNORMAL HIGH (ref 70–99)
Potassium: 3.7 mmol/L (ref 3.5–5.2)
Sodium: 143 mmol/L (ref 134–144)
Total Protein: 6.9 g/dL (ref 6.0–8.5)
eGFR: 84 mL/min/1.73

## 2024-01-31 ENCOUNTER — Ambulatory Visit: Payer: Self-pay | Admitting: Family Medicine

## 2024-01-31 NOTE — Progress Notes (Signed)
Hello [place],  Your lab result is normal and/or stable.Some minor variations that are not significant are commonly marked abnormal, but do not represent any medical problem for you.  Best regards, Myron Stankovich, M.D.

## 2024-02-01 ENCOUNTER — Ambulatory Visit: Admitting: Family Medicine

## 2024-04-15 ENCOUNTER — Inpatient Hospital Stay

## 2024-04-22 ENCOUNTER — Telehealth: Admitting: Oncology

## 2024-04-26 ENCOUNTER — Inpatient Hospital Stay: Admitting: Oncology

## 2024-04-27 ENCOUNTER — Ambulatory Visit: Admitting: Family Medicine
